# Patient Record
Sex: Female | Born: 1990 | Race: Black or African American | Hispanic: No | Marital: Single | Smoking: Current some day smoker
Health system: Southern US, Community
[De-identification: ages and names within clinical notes are randomized; demographics above are authoritative.]

## PROBLEM LIST (undated history)

## (undated) DIAGNOSIS — C801 Malignant (primary) neoplasm, unspecified: Secondary | ICD-10-CM

## (undated) DIAGNOSIS — Z923 Personal history of irradiation: Secondary | ICD-10-CM

## (undated) DIAGNOSIS — J45909 Unspecified asthma, uncomplicated: Secondary | ICD-10-CM

## (undated) HISTORY — PX: TONSILLECTOMY: SUR1361

---
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
## 2012-03-19 ENCOUNTER — Emergency Department (HOSPITAL_COMMUNITY)
Admission: EM | Admit: 2012-03-19 | Discharge: 2012-03-20 | Disposition: A | Discharge status: Home / Self Care | Payer: Medicaid Other | Attending: Emergency Medicine | Admitting: Emergency Medicine

## 2012-03-19 ENCOUNTER — Encounter (HOSPITAL_COMMUNITY): Payer: Self-pay | Admitting: *Deleted

## 2012-03-19 DIAGNOSIS — J45909 Unspecified asthma, uncomplicated: Secondary | ICD-10-CM | POA: Insufficient documentation

## 2012-03-19 DIAGNOSIS — F17200 Nicotine dependence, unspecified, uncomplicated: Secondary | ICD-10-CM | POA: Insufficient documentation

## 2012-03-19 DIAGNOSIS — J40 Bronchitis, not specified as acute or chronic: Secondary | ICD-10-CM | POA: Insufficient documentation

## 2012-03-19 HISTORY — DX: Unspecified asthma, uncomplicated: J45.909

## 2012-03-19 NOTE — ED Notes (Signed)
Pt c/o cough; rib pain; head cold; out of [facility] med

## 2012-03-20 MED ORDER — HYDROCOD POLST-CHLORPHEN POLST 10-8 MG/5ML PO LQCR: ORAL | Status: DC

## 2012-03-20 MED ORDER — DOXYCYCLINE HYCLATE 100 MG PO CAPS: 100.0000 mg | ORAL_CAPSULE | Freq: Two times a day (BID) | ORAL | Status: AC

## 2012-03-20 NOTE — ED Provider Notes (Signed)
History     CSN: [id number]  Arrival date & time 03/19/12  2310   First MD Initiated Contact with Patient 03/20/12 0012      Chief Complaint  Patient presents with  . URI    (Consider location/radiation/quality/duration/timing/severity/associated sxs/prior treatment) Patient is a 21 y.o. female presenting with URI. The history is provided by the patient (pt complains of cough). No language interpreter was used.  URI The primary symptoms include cough. Primary symptoms do not include fever, fatigue, headaches, ear pain, abdominal pain or rash. The current episode started 2 days ago. This is a new problem. The problem has not changed since onset. Associated with: nothing. The illness is not associated with chills, sinus pressure or congestion. The following treatments were addressed: Acetaminophen was not tried. A decongestant was not tried. Aspirin was not tried. NSAIDs were not tried. Risk factors: asthma.    Past Medical History  Diagnosis Date  . Asthma     Past Surgical History  Procedure Date  . Tonsillectomy     No family history on file.  History  Substance Use Topics  . Smoking status: Current Everyday Smoker -- 1.0 packs/day  . Smokeless tobacco: Not on file  . Alcohol Use: No    OB History    Grav Para Term Preterm Abortions TAB SAB Ect Mult Living                  Review of Systems  Constitutional: Negative for fever, chills and fatigue.  HENT: Negative for ear pain, congestion, sinus pressure and ear discharge.   Eyes: Negative for discharge.  Respiratory: Positive for cough.   Cardiovascular: Negative for chest pain.  Gastrointestinal: Negative for abdominal pain and diarrhea.  Genitourinary: Negative for frequency and hematuria.  Musculoskeletal: Negative for back pain.  Skin: Negative for rash.  Neurological: Negative for seizures and headaches.  Hematological: Negative.   Psychiatric/Behavioral: Negative for hallucinations.    Allergies    Penicillins  Home Medications   Current Outpatient Rx  Name Route Sig Dispense Refill  . ALBUTEROL SULFATE HFA 108 (90 BASE) MCG/ACT IN AERS Inhalation Inhale 2 puffs into the lungs every 6 (six) hours as needed.    Marland Kitchen HYDROCOD POLST-CPM POLST ER 10-8 MG/5ML PO LQCR  One teaspoon every 12 hours for cough 115 mL 0  . DOXYCYCLINE HYCLATE 100 MG PO CAPS Oral Take 1 capsule (100 mg total) by mouth 2 (two) times daily. 14 capsule 0    BP 133/76  Pulse 85  Temp 98.8 F (37.1 C) (Oral)  Resp 18  SpO2 100%  LMP 02/27/2012  Physical Exam  Constitutional: She is oriented to person, place, and time. She appears well-developed.  HENT:  Head: Normocephalic and atraumatic.  Eyes: Conjunctivae and EOM are normal. No scleral icterus.  Neck: Neck supple. No thyromegaly present.  Cardiovascular: Normal rate and regular rhythm.  Exam reveals no gallop and no friction rub.   No murmur heard. Pulmonary/Chest: No stridor. She has no wheezes. She has no rales. She exhibits no tenderness.  Abdominal: She exhibits no distension. There is no tenderness. There is no rebound.  Musculoskeletal: Normal range of motion. She exhibits no edema.  Lymphadenopathy:    She has no cervical adenopathy.  Neurological: She is oriented to person, place, and time. Coordination normal.  Skin: No rash noted. No erythema.  Psychiatric: She has a normal mood and affect. Her behavior is normal.    ED Course  Procedures (including critical care time)  Labs Reviewed - No data to display No results found.   1. Bronchitis       MDM          Benny Lennert, MD 03/20/12 [phone]

## 2012-08-06 ENCOUNTER — Emergency Department (HOSPITAL_COMMUNITY)
Admission: EM | Admit: 2012-08-06 | Discharge: 2012-08-07 | Disposition: A | Discharge status: Home / Self Care | Payer: Medicaid Other | Attending: Emergency Medicine | Admitting: Emergency Medicine

## 2012-08-06 ENCOUNTER — Encounter (HOSPITAL_COMMUNITY): Payer: Self-pay | Admitting: Emergency Medicine

## 2012-08-06 DIAGNOSIS — IMO0001 Reserved for inherently not codable concepts without codable children: Secondary | ICD-10-CM | POA: Insufficient documentation

## 2012-08-06 DIAGNOSIS — R6889 Other general symptoms and signs: Secondary | ICD-10-CM

## 2012-08-06 DIAGNOSIS — F17200 Nicotine dependence, unspecified, uncomplicated: Secondary | ICD-10-CM | POA: Insufficient documentation

## 2012-08-06 DIAGNOSIS — J45909 Unspecified asthma, uncomplicated: Secondary | ICD-10-CM | POA: Insufficient documentation

## 2012-08-06 DIAGNOSIS — R51 Headache: Secondary | ICD-10-CM | POA: Insufficient documentation

## 2012-08-06 DIAGNOSIS — Z79899 Other long term (current) drug therapy: Secondary | ICD-10-CM | POA: Insufficient documentation

## 2012-08-06 MED ORDER — IBUPROFEN 800 MG PO TABS
800.0000 mg | ORAL_TABLET | Freq: Once | ORAL | Status: AC
  Administered 2012-08-06: 800 mg via ORAL
  Filled 2012-08-06: qty 1

## 2012-08-06 NOTE — ED Notes (Signed)
Patient states she has had fever and generalized body aches since Monday 08/03/12. Saw MD at [facility] today, was told a Rx was being sent. Patient greatest complaint at this time is a headache, that has lasted @2  hours. Patient was here with mother and requested treatment. Patient afebrile at this time.

## 2012-08-06 NOTE — ED Notes (Addendum)
Patient states on Monday she began having "flu-like" symptoms. Patient reports fever 103.6 yesterday (Wednesday). Generalized body aches. Patient states she did not receive the flu vaccine this year. Patient reports cough and headache.

## 2012-08-08 NOTE — ED Provider Notes (Signed)
History     CSN: [id number]  Arrival date & time 08/06/12  2301   First MD Initiated Contact with Patient 08/06/12 2316      Chief Complaint  Patient presents with  . Fever  . Generalized Body Aches    (Consider location/radiation/quality/duration/timing/severity/associated sxs/prior treatment) HPI Comments: Faith Pope 21 y.o. female   The chief complaint is: Patient presents with: Headache    Patient here with her mother who is in acute care. She asked one of the staff for advil for a headache but mentioned that she had been having flu-like sxs this week and was asked to check in. Patient states that she has had fever up to 103,malaise, chills, h/a, rinohrrhea and cough for several days. She is afebrile now and states that she is feeling much better than she has previously. Headache is global and worse with cough. Gradual in onset. 6/10. Denies photophobia, phonophobia, UL throbbing, N/V, visual changes, stiff neck, neck pain, rash, or "thunderclap" onset. Denies DOE, SOB, chest tightness or pressure, radiation to left arm, jaw or back, or diaphoresis. Denies dysuria, flank pain, suprapubic pain, frequency, urgency, or hematuria.  light headedness, weakness, . Denies abdominal pain,diarrhea or constipation.    The history is provided by the patient. No language interpreter was used.    Past Medical History  Diagnosis Date  . Asthma     Past Surgical History  Procedure Date  . Tonsillectomy     History reviewed. No pertinent family history.  History  Substance Use Topics  . Smoking status: Current Every Day Smoker -- 1.0 packs/day    Types: Cigarettes  . Smokeless tobacco: Not on file  . Alcohol Use: Yes     Comment: socially    OB History    Grav Para Term Preterm Abortions TAB SAB Ect Mult Living                  Review of Systems Ten systems reviewed and are negative for acute change, except as noted in the HPI.   Allergies  Penicillins  Home  Medications   Current Outpatient Rx  Name  Route  Sig  Dispense  Refill  . ALBUTEROL SULFATE HFA 108 (90 BASE) MCG/ACT IN AERS   Inhalation   Inhale 2 puffs into the lungs every 6 (six) hours as needed.         Marland Kitchen HYDROCOD POLST-CPM POLST ER 10-8 MG/5ML PO LQCR      One teaspoon every 12 hours for cough   115 mL   0     BP 110/73  Pulse 80  Temp 98.7 F (37.1 C) (Oral)  Resp 14  Ht 5\' 2"  (1.575 m)  Wt 143 lb (64.864 kg)  BMI 26.15 kg/m2  SpO2 100%  LMP 07/07/2012  Physical Exam  Nursing note and vitals reviewed. Constitutional: She is oriented to person, place, and time. She appears well-developed and well-nourished. No distress.  HENT:  Head: Normocephalic and atraumatic.  Right Ear: External ear normal.  Left Ear: External ear normal.  Mouth/Throat: No oropharyngeal exudate.  Eyes: Conjunctivae normal are normal. Pupils are equal, round, and reactive to light. No scleral icterus.  Neck: Normal range of motion. Neck supple. No JVD present. No thyromegaly present.  Cardiovascular: Normal rate, regular rhythm, normal heart sounds and intact distal pulses.  Exam reveals no gallop and no friction rub.   No murmur heard. Pulmonary/Chest: Effort normal and breath sounds normal. No respiratory distress.  Abdominal: Soft. Bowel sounds  are normal. She exhibits no distension and no mass. There is no tenderness. There is no guarding.  Musculoskeletal: Normal range of motion. She exhibits no edema and no tenderness.       No meningismus  Neurological: She is alert and oriented to person, place, and time. She has normal reflexes. No cranial nerve deficit. Coordination normal.  Skin: Skin is warm and dry. No rash noted. She is not diaphoretic.    ED Course  Procedures (including critical care time)  Labs Reviewed - No data to display No results found.   1. Headache   2. Flu-like symptoms       MDM   Filed Vitals:   08/06/12 2336  BP: 110/73  Pulse: 80  Temp: 98.7  F (37.1 C)  TempSrc: Oral  Resp: 14  Height: 5\' 2"  (1.575 m)  Weight: 143 lb (64.864 kg)  SpO2: 100%   Patient c/o headache with sxs of SAH or meningitis. Pt is afebrile with no focal neuro deficits, nuchal rigidity, or change in vision.  Patient sxs have improved with medication. Suggest hydration, rest and otc analgesia for HA/Flu sxs. Return precautions discussed,Pt verbalizes understanding and is agreeable with plan to dc.           Arthor Captain, PA-C 08/09/12 0000

## 2012-08-09 NOTE — ED Provider Notes (Signed)
Medical screening examination/treatment/procedure(s) were performed by non-physician practitioner and as supervising physician I was immediately available for consultation/collaboration.   Celene Kras, MD 08/09/12 [phone]

## 2013-08-12 ENCOUNTER — Emergency Department (HOSPITAL_COMMUNITY)
Admission: EM | Admit: 2013-08-12 | Discharge: 2013-08-12 | Disposition: A | Discharge status: Home / Self Care | Payer: Medicaid Other | Attending: Emergency Medicine | Admitting: Emergency Medicine

## 2013-08-12 ENCOUNTER — Encounter (HOSPITAL_COMMUNITY): Payer: Self-pay | Admitting: Emergency Medicine

## 2013-08-12 DIAGNOSIS — J069 Acute upper respiratory infection, unspecified: Secondary | ICD-10-CM | POA: Insufficient documentation

## 2013-08-12 DIAGNOSIS — Z79899 Other long term (current) drug therapy: Secondary | ICD-10-CM | POA: Insufficient documentation

## 2013-08-12 DIAGNOSIS — Z880 Allergy status to penicillin: Secondary | ICD-10-CM | POA: Insufficient documentation

## 2013-08-12 DIAGNOSIS — J45909 Unspecified asthma, uncomplicated: Secondary | ICD-10-CM | POA: Insufficient documentation

## 2013-08-12 DIAGNOSIS — F17200 Nicotine dependence, unspecified, uncomplicated: Secondary | ICD-10-CM | POA: Insufficient documentation

## 2013-08-12 LAB — RAPID STREP SCREEN (MED CTR MEBANE ONLY): Streptococcus, Group A Screen (Direct): NEGATIVE

## 2013-08-12 NOTE — ED Provider Notes (Signed)
CSN: [id number]     Arrival date & time 08/12/13  1642 History  This chart was scribed for non-physician practitioner working with Lyanne Co, MD by Ashley Jacobs, ED scribe. This patient was seen in room WTR7/WTR7 and the patient's care was started at 5:38 PM.   First MD Initiated Contact with Patient 08/12/13 1723     Chief Complaint  Patient presents with  . Sore Throat   (Consider location/radiation/quality/duration/timing/severity/associated sxs/prior Treatment) The history is provided by the patient and medical records. No language interpreter was used.   HPI Comments: Faith Pope is a 22 y.o. female who presents to the Emergency Department complaining of persistent sore throat for the past week. She as the associated symptoms of "stuffiness", runny nose, and headache/ sinus pressure to her frontal region. Pt denies appetites changes, chest pain, abdominal pain, and cough. She had a prior tonsillectomy. Pt had sick contact. She is unable to go back to work until she visits a doctor ( she is unable to go back to work until she is cleared by a doctor).  Past Medical History  Diagnosis Date  . Asthma    Past Surgical History  Procedure Laterality Date  . Tonsillectomy     History reviewed. No pertinent family history. History  Substance Use Topics  . Smoking status: Current Every Day Smoker -- 1.00 packs/day    Types: Cigarettes  . Smokeless tobacco: Not on file  . Alcohol Use: Yes     Comment: socially   OB History   Grav Para Term Preterm Abortions TAB SAB Ect Mult Living                 Review of Systems  Constitutional: Negative for appetite change.  HENT: Positive for congestion, rhinorrhea, sinus pressure and sore throat.   Respiratory: Negative for cough.   Cardiovascular: Negative for chest pain.  Gastrointestinal: Negative for abdominal pain.    Allergies  Penicillins  Home Medications   Current Outpatient Rx  Name  Route  Sig  Dispense  Refill   . albuterol (PROVENTIL HFA;VENTOLIN HFA) 108 (90 BASE) MCG/ACT inhaler   Inhalation   Inhale 2 puffs into the lungs every 6 (six) hours as needed.         . chlorpheniramine-HYDROcodone (TUSSIONEX PENNKINETIC ER) 10-8 MG/5ML LQCR      One teaspoon every 12 hours for cough   115 mL   0    BP 117/67  Pulse 78  Temp(Src) 98.8 F (37.1 C) (Oral)  Resp 20  SpO2 99% Physical Exam  Nursing note and vitals reviewed. Constitutional: She appears well-developed and well-nourished. No distress.  HENT:  Head: Normocephalic and atraumatic.  Mouth/Throat: Oropharynx is clear and moist. No oropharyngeal exudate.  Eyes: Conjunctivae are normal.  Neck: Neck supple.  Cardiovascular: Normal rate, regular rhythm and normal heart sounds.   Pulmonary/Chest: Effort normal and breath sounds normal. No respiratory distress. She has no wheezes. She has no rales. She exhibits no tenderness.  Lymphadenopathy:    She has no cervical adenopathy.  Neurological: She is alert.  Skin: She is not diaphoretic.    ED Course  Procedures (including critical care time) DIAGNOSTIC STUDIES: Oxygen Saturation is 99% on room air, normal by my interpretation.    COORDINATION OF CARE:  5:42 PM Discussed course of care with pt which includes rapid strep test.  Pt understands and agrees.   Labs Review Labs Reviewed  RAPID STREP SCREEN  CULTURE, GROUP A STREP  Imaging Review No results found.  EKG Interpretation   None       MDM   1. URI (upper respiratory infection)    Patient with sore throat and nasal congestion x 1 week.  Was sent to ED by her place of employment.  Pt states she feels she is getting better on her own.  Afebrile, nontoxic.  Exam unremarkable.  D/C home.  Discussed findings, treatment, and follow up  with patient.  Pt given return precautions.  Pt verbalizes understanding and agrees with plan.       I personally performed the services described in this documentation, which was  scribed in my presence. The recorded information has been reviewed and is accurate.     Trixie Dredge, PA-C 08/12/13 [phone]

## 2013-08-12 NOTE — ED Notes (Addendum)
Pt reports sore throat for past few days. Also has non productive cough. Pt states she needs work note to go back to work

## 2013-08-12 NOTE — ED Provider Notes (Signed)
Medical screening examination/treatment/procedure(s) were performed by non-physician practitioner and as supervising physician I was immediately available for consultation/collaboration.  EKG Interpretation   None         Lyanne Co, MD 08/12/13 2236

## 2013-08-14 LAB — CULTURE, GROUP A STREP

## 2013-12-05 ENCOUNTER — Encounter (HOSPITAL_COMMUNITY): Payer: Self-pay | Admitting: Emergency Medicine

## 2013-12-05 ENCOUNTER — Emergency Department (HOSPITAL_COMMUNITY)
Admission: EM | Admit: 2013-12-05 | Discharge: 2013-12-05 | Disposition: A | Discharge status: Home / Self Care | Payer: Medicaid Other | Attending: Emergency Medicine | Admitting: Emergency Medicine

## 2013-12-05 ENCOUNTER — Emergency Department (HOSPITAL_COMMUNITY): Payer: Medicaid Other

## 2013-12-05 DIAGNOSIS — Y92410 Unspecified street and highway as the place of occurrence of the external cause: Secondary | ICD-10-CM | POA: Insufficient documentation

## 2013-12-05 DIAGNOSIS — J45909 Unspecified asthma, uncomplicated: Secondary | ICD-10-CM | POA: Insufficient documentation

## 2013-12-05 DIAGNOSIS — S40011A Contusion of right shoulder, initial encounter: Secondary | ICD-10-CM

## 2013-12-05 DIAGNOSIS — Y9389 Activity, other specified: Secondary | ICD-10-CM | POA: Insufficient documentation

## 2013-12-05 DIAGNOSIS — S8002XA Contusion of left knee, initial encounter: Secondary | ICD-10-CM

## 2013-12-05 DIAGNOSIS — S40019A Contusion of unspecified shoulder, initial encounter: Secondary | ICD-10-CM | POA: Insufficient documentation

## 2013-12-05 DIAGNOSIS — F17200 Nicotine dependence, unspecified, uncomplicated: Secondary | ICD-10-CM | POA: Insufficient documentation

## 2013-12-05 DIAGNOSIS — Z880 Allergy status to penicillin: Secondary | ICD-10-CM | POA: Insufficient documentation

## 2013-12-05 DIAGNOSIS — S8000XA Contusion of unspecified knee, initial encounter: Secondary | ICD-10-CM | POA: Insufficient documentation

## 2013-12-05 MED ORDER — IBUPROFEN 800 MG PO TABS
800.0000 mg | ORAL_TABLET | Freq: Once | ORAL | Status: AC
  Administered 2013-12-05: 800 mg via ORAL
  Filled 2013-12-05: qty 1

## 2013-12-05 MED ORDER — IBUPROFEN 800 MG PO TABS: 800.0000 mg | ORAL_TABLET | Freq: Three times a day (TID) | ORAL | Status: DC
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
## 2013-12-05 NOTE — Discharge Instructions (Signed)
Your x-rays do not show any broken bones or dislocations in your shoulder or knee from your injuries and fall. Continue to rest your sore areas and followup with a primary care provider for continued evaluation and treatment.    Contusion A contusion is a deep bruise. Contusions are the result of an injury that caused bleeding under the skin. The contusion may turn blue, purple, or yellow. Minor injuries will give you a painless contusion, but more severe contusions may stay painful and swollen for a few weeks.  CAUSES  A contusion is usually caused by a blow, trauma, or direct force to an area of the body. SYMPTOMS   Swelling and redness of the injured area.  Bruising of the injured area.  Tenderness and soreness of the injured area.  Pain. DIAGNOSIS  The diagnosis can be made by taking a history and physical exam. An X-ray, CT scan, or MRI may be needed to determine if there were any associated injuries, such as fractures. TREATMENT  Specific treatment will depend on what area of the body was injured. In general, the best treatment for a contusion is resting, icing, elevating, and applying cold compresses to the injured area. Over-the-counter medicines may also be recommended for pain control. Ask your caregiver what the best treatment is for your contusion. HOME CARE INSTRUCTIONS   Put ice on the injured area.  Put ice in a plastic bag.  Place a towel between your skin and the bag.  Leave the ice on for 15-20 minutes, 03-04 times a day.  Only take over-the-counter or prescription medicines for pain, discomfort, or fever as directed by your caregiver. Your caregiver may recommend avoiding anti-inflammatory medicines (aspirin, ibuprofen, and naproxen) for 48 hours because these medicines may increase bruising.  Rest the injured area.  If possible, elevate the injured area to reduce swelling. SEEK IMMEDIATE MEDICAL CARE IF:   You have increased bruising or swelling.  You have  pain that is getting worse.  Your swelling or pain is not relieved with medicines. MAKE SURE YOU:   Understand these instructions.  Will watch your condition.  Will get help right away if you are not doing well or get worse. Document Released: 05/22/2005 Document Revised: 11/04/2011 Document Reviewed: 06/17/2011 [facility] Patient Information 2014 [place], [place].    Cryotherapy Cryotherapy means treatment with cold. Ice or gel packs can be used to reduce both pain and swelling. Ice is the most helpful within the first 24 to 48 hours after an injury or flareup from overusing a muscle or joint. Sprains, strains, spasms, burning pain, shooting pain, and aches can all be eased with ice. Ice can also be used when recovering from surgery. Ice is effective, has very few side effects, and is safe for most people to use. PRECAUTIONS  Ice is not a safe treatment option for people with:  Raynaud's phenomenon. This is a condition affecting small blood vessels in the extremities. Exposure to cold may cause your problems to return.  Cold hypersensitivity. There are many forms of cold hypersensitivity, including:  Cold urticaria. Red, itchy hives appear on the skin when the tissues begin to warm after being iced.  Cold erythema. This is a red, itchy rash caused by exposure to cold.  Cold hemoglobinuria. Red blood cells break down when the tissues begin to warm after being iced. The hemoglobin that carry oxygen are passed into the urine because they cannot combine with blood proteins fast enough.  Numbness or altered sensitivity in the area  being iced. If you have any of the following conditions, do not use ice until you have discussed cryotherapy with your caregiver:  Heart conditions, such as arrhythmia, angina, or chronic heart disease.  High blood pressure.  Healing wounds or open skin in the area being iced.  Current infections.  Rheumatoid arthritis.  Poor  circulation.  Diabetes. Ice slows the blood flow in the region it is applied. This is beneficial when trying to stop inflamed tissues from spreading irritating chemicals to surrounding tissues. However, if you expose your skin to cold temperatures for too long or without the proper protection, you can damage your skin or nerves. Watch for signs of skin damage due to cold. HOME CARE INSTRUCTIONS Follow these tips to use ice and cold packs safely.  Place a dry or damp towel between the ice and skin. A damp towel will cool the skin more quickly, so you may need to shorten the time that the ice is used.  For a more rapid response, add gentle compression to the ice.  Ice for no more than 10 to 20 minutes at a time. The bonier the area you are icing, the less time it will take to get the benefits of ice.  Check your skin after 5 minutes to make sure there are no signs of a poor response to cold or skin damage.  Rest 20 minutes or more in between uses.  Once your skin is numb, you can end your treatment. You can test numbness by very lightly touching your skin. The touch should be so light that you do not see the skin dimple from the pressure of your fingertip. When using ice, most people will feel these normal sensations in this order: cold, burning, aching, and numbness.  Do not use ice on someone who cannot communicate their responses to pain, such as small children or people with dementia. HOW TO MAKE AN ICE PACK Ice packs are the most common way to use ice therapy. Other methods include ice massage, ice baths, and cryo-sprays. Muscle creams that cause a cold, tingly feeling do not offer the same benefits that ice offers and should not be used as a substitute unless recommended by your caregiver. To make an ice pack, do one of the following:  Place crushed ice or a bag of frozen vegetables in a sealable plastic bag. Squeeze out the excess air. Place this bag inside another plastic bag. Slide the  bag into a pillowcase or place a damp towel between your skin and the bag.  Mix 3 parts water with 1 part rubbing alcohol. Freeze the mixture in a sealable plastic bag. When you remove the mixture from the freezer, it will be slushy. Squeeze out the excess air. Place this bag inside another plastic bag. Slide the bag into a pillowcase or place a damp towel between your skin and the bag. SEEK MEDICAL CARE IF:  You develop white spots on your skin. This may give the skin a blotchy (mottled) appearance.  Your skin turns blue or pale.  Your skin becomes waxy or hard.  Your swelling gets worse. MAKE SURE YOU:   Understand these instructions.  Will watch your condition.  Will get help right away if you are not doing well or get worse. Document Released: 04/08/2011 Document Revised: 11/04/2011 Document Reviewed: 04/08/2011 [facility] Patient Information 2014 [place], [place].

## 2013-12-05 NOTE — ED Provider Notes (Signed)
Medical screening examination/treatment/procedure(s) were performed by non-physician practitioner and as supervising physician I was immediately available for consultation/collaboration.   EKG Interpretation None       Kalman Drape, MD 12/05/13 [phone]

## 2014-11-21 ENCOUNTER — Emergency Department (HOSPITAL_COMMUNITY)
Admission: EM | Admit: 2014-11-21 | Discharge: 2014-11-21 | Disposition: A | Discharge status: Home / Self Care | Payer: Medicaid Other | Attending: Emergency Medicine | Admitting: Emergency Medicine

## 2014-11-21 ENCOUNTER — Encounter (HOSPITAL_COMMUNITY): Payer: Self-pay | Admitting: Emergency Medicine

## 2014-11-21 ENCOUNTER — Emergency Department (HOSPITAL_COMMUNITY): Payer: Medicaid Other

## 2014-11-21 DIAGNOSIS — W1839XA Other fall on same level, initial encounter: Secondary | ICD-10-CM | POA: Insufficient documentation

## 2014-11-21 DIAGNOSIS — Y9389 Activity, other specified: Secondary | ICD-10-CM | POA: Insufficient documentation

## 2014-11-21 DIAGNOSIS — Z791 Long term (current) use of non-steroidal anti-inflammatories (NSAID): Secondary | ICD-10-CM | POA: Insufficient documentation

## 2014-11-21 DIAGNOSIS — Y9289 Other specified places as the place of occurrence of the external cause: Secondary | ICD-10-CM | POA: Insufficient documentation

## 2014-11-21 DIAGNOSIS — M25561 Pain in right knee: Secondary | ICD-10-CM

## 2014-11-21 DIAGNOSIS — S8991XA Unspecified injury of right lower leg, initial encounter: Secondary | ICD-10-CM | POA: Insufficient documentation

## 2014-11-21 DIAGNOSIS — Z880 Allergy status to penicillin: Secondary | ICD-10-CM | POA: Insufficient documentation

## 2014-11-21 DIAGNOSIS — Z720 Tobacco use: Secondary | ICD-10-CM | POA: Insufficient documentation

## 2014-11-21 DIAGNOSIS — Y998 Other external cause status: Secondary | ICD-10-CM | POA: Insufficient documentation

## 2014-11-21 DIAGNOSIS — J45909 Unspecified asthma, uncomplicated: Secondary | ICD-10-CM | POA: Insufficient documentation

## 2014-11-21 MED ORDER — IBUPROFEN 800 MG PO TABS: 800.0000 mg | ORAL_TABLET | Freq: Three times a day (TID) | ORAL | Status: DC

## 2014-11-21 MED ORDER — IBUPROFEN 800 MG PO TABS
800.0000 mg | ORAL_TABLET | Freq: Once | ORAL | Status: AC
  Administered 2014-11-21: 800 mg via ORAL
  Filled 2014-11-21: qty 1

## 2014-11-21 MED ORDER — HYDROCODONE-ACETAMINOPHEN 5-325 MG PO TABS
1.0000 | ORAL_TABLET | Freq: Once | ORAL | Status: AC
  Administered 2014-11-21: 1 via ORAL
  Filled 2014-11-21: qty 1

## 2014-11-21 MED ORDER — HYDROCODONE-ACETAMINOPHEN 5-325 MG PO TABS: 1.0000 | ORAL_TABLET | ORAL | Status: DC | PRN

## 2014-11-21 NOTE — Discharge Instructions (Signed)
Cryotherapy °Cryotherapy means treatment with cold. Ice or gel packs can be used to reduce both pain and swelling. Ice is the most helpful within the first 24 to 48 hours after an injury or flare-up from overusing a muscle or joint. Sprains, strains, spasms, burning pain, shooting pain, and aches can all be eased with ice. Ice can also be used when recovering from surgery. Ice is effective, has very few side effects, and is safe for most people to use. °PRECAUTIONS  °Ice is not a safe treatment option for people with: °· Raynaud phenomenon. This is a condition affecting small blood vessels in the extremities. Exposure to cold may cause your problems to return. °· Cold hypersensitivity. There are many forms of cold hypersensitivity, including: °· Cold urticaria. Red, itchy hives appear on the skin when the tissues begin to warm after being iced. °· Cold erythema. This is a red, itchy rash caused by exposure to cold. °· Cold hemoglobinuria. Red blood cells break down when the tissues begin to warm after being iced. The hemoglobin that carry oxygen are passed into the urine because they cannot combine with blood proteins fast enough. °· Numbness or altered sensitivity in the area being iced. °If you have any of the following conditions, do not use ice until you have discussed cryotherapy with your caregiver: °· Heart conditions, such as arrhythmia, angina, or chronic heart disease. °· High blood pressure. °· Healing wounds or open skin in the area being iced. °· Current infections. °· Rheumatoid arthritis. °· Poor circulation. °· Diabetes. °Ice slows the blood flow in the region it is applied. This is beneficial when trying to stop inflamed tissues from spreading irritating chemicals to surrounding tissues. However, if you expose your skin to cold temperatures for too long or without the proper protection, you can damage your skin or nerves. Watch for signs of skin damage due to cold. °HOME CARE INSTRUCTIONS °Follow  these tips to use ice and cold packs safely. °· Place a dry or damp towel between the ice and skin. A damp towel will cool the skin more quickly, so you may need to shorten the time that the ice is used. °· For a more rapid response, add gentle compression to the ice. °· Ice for no more than 10 to 20 minutes at a time. The bonier the area you are icing, the less time it will take to get the benefits of ice. °· Check your skin after 5 minutes to make sure there are no signs of a poor response to cold or skin damage. °· Rest 20 minutes or more between uses. °· Once your skin is numb, you can end your treatment. You can test numbness by very lightly touching your skin. The touch should be so light that you do not see the skin dimple from the pressure of your fingertip. When using ice, most people will feel these normal sensations in this order: cold, burning, aching, and numbness. °· Do not use ice on someone who cannot communicate their responses to pain, such as small children or people with dementia. °HOW TO MAKE AN ICE PACK °Ice packs are the most common way to use ice therapy. Other methods include ice massage, ice baths, and cryosprays. Muscle creams that cause a cold, tingly feeling do not offer the same benefits that ice offers and should not be used as a substitute unless recommended by your caregiver. °To make an ice pack, do one of the following: °· Place crushed ice or a   bag of frozen vegetables in a sealable plastic bag. Squeeze out the excess air. Place this bag inside another plastic bag. Slide the bag into a pillowcase or place a damp towel between your skin and the bag.  Mix 3 parts water with 1 part rubbing alcohol. Freeze the mixture in a sealable plastic bag. When you remove the mixture from the freezer, it will be slushy. Squeeze out the excess air. Place this bag inside another plastic bag. Slide the bag into a pillowcase or place a damp towel between your skin and the bag. SEEK MEDICAL CARE  IF:  You develop white spots on your skin. This may give the skin a blotchy (mottled) appearance.  Your skin turns blue or pale.  Your skin becomes waxy or hard.  Your swelling gets worse. MAKE SURE YOU:   Understand these instructions.  Will watch your condition.  Will get help right away if you are not doing well or get worse. Document Released: 04/08/2011 Document Revised: 12/27/2013 Document Reviewed: 04/08/2011 [facility] Patient Information 2015 [place], [place]. This information is not intended to replace advice given to you by your health care provider. Make sure you discuss any questions you have with your health care provider. Knee Sprain A knee sprain is a tear in one of the strong, fibrous tissues that connect the bones (ligaments) in your knee. The severity of the sprain depends on how much of the ligament is torn. The tear can be either partial or complete. CAUSES  Often, sprains are a result of a fall or injury. The force of the impact causes the fibers of your ligament to stretch too much. This excess tension causes the fibers of your ligament to tear. SIGNS AND SYMPTOMS  You may have some loss of motion in your knee. Other symptoms include:  Bruising.  Pain in the knee area.  Tenderness of the knee to the touch.  Swelling. DIAGNOSIS  To diagnose a knee sprain, your health care provider will physically examine your knee. Your health care provider may also suggest an X-ray exam of your knee to make sure no bones are broken. TREATMENT  If your ligament is only partially torn, treatment usually involves keeping the knee in a fixed position (immobilization) or bracing your knee for activities that require movement for several weeks. To do this, your health care provider will apply a bandage, cast, or splint to keep your knee from moving and to support your knee during movement until it heals. For a partially torn ligament, the healing process usually takes 4-6 weeks. If  your ligament is completely torn, depending on which ligament it is, you may need surgery to reconnect the ligament to the bone or reconstruct it. After surgery, a cast or splint may be applied and will need to stay on your knee for 4-6 weeks while your ligament heals. HOME CARE INSTRUCTIONS  Keep your injured knee elevated to decrease swelling.  To ease pain and swelling, apply ice to the injured area:  Put ice in a plastic bag.  Place a towel between your skin and the bag.  Leave the ice on for 20 minutes, 2-3 times a day.  Only take medicine for pain as directed by your health care provider.  Do not leave your knee unprotected until pain and stiffness go away (usually 4-6 weeks).  If you have a cast or splint, do not allow it to get wet. If you have been instructed not to remove it, cover it with a plastic  bag when you shower or bathe. Do not swim.  Your health care provider may suggest exercises for you to do during your recovery to prevent or limit permanent weakness and stiffness. SEEK IMMEDIATE MEDICAL CARE IF:  Your cast or splint becomes damaged.  Your pain becomes worse.  You have significant pain, swelling, or numbness below the cast or splint. MAKE SURE YOU:  Understand these instructions.  Will watch your condition.  Will get help right away if you are not doing well or get worse. Document Released: 08/12/2005 Document Revised: 06/02/2013 Document Reviewed: 03/24/2013 [facility] Patient Information 2015 [place], [place]. This information is not intended to replace advice given to you by your health care provider. Make sure you discuss any questions you have with your health care provider.
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
## 2014-12-02 ENCOUNTER — Encounter (HOSPITAL_COMMUNITY): Payer: Self-pay | Admitting: Emergency Medicine

## 2014-12-02 ENCOUNTER — Emergency Department (HOSPITAL_COMMUNITY)
Admission: EM | Admit: 2014-12-02 | Discharge: 2014-12-02 | Disposition: A | Discharge status: Home / Self Care | Payer: Medicaid Other | Attending: Emergency Medicine | Admitting: Emergency Medicine

## 2014-12-02 ENCOUNTER — Emergency Department (HOSPITAL_COMMUNITY): Payer: Medicaid Other

## 2014-12-02 DIAGNOSIS — J45909 Unspecified asthma, uncomplicated: Secondary | ICD-10-CM | POA: Insufficient documentation

## 2014-12-02 DIAGNOSIS — Z720 Tobacco use: Secondary | ICD-10-CM | POA: Insufficient documentation

## 2014-12-02 DIAGNOSIS — Z79899 Other long term (current) drug therapy: Secondary | ICD-10-CM | POA: Insufficient documentation

## 2014-12-02 DIAGNOSIS — M25562 Pain in left knee: Secondary | ICD-10-CM

## 2014-12-02 DIAGNOSIS — R52 Pain, unspecified: Secondary | ICD-10-CM

## 2014-12-02 DIAGNOSIS — Z880 Allergy status to penicillin: Secondary | ICD-10-CM | POA: Insufficient documentation

## 2014-12-02 MED ORDER — HYDROCODONE-ACETAMINOPHEN 5-325 MG PO TABS
1.0000 | ORAL_TABLET | Freq: Once | ORAL | Status: AC
Start: 2014-12-02 — End: 2014-12-02
  Administered 2014-12-02: 1 via ORAL
  Filled 2014-12-02: qty 1

## 2014-12-02 MED ORDER — TRAMADOL HCL 50 MG PO TABS: 50.0000 mg | ORAL_TABLET | Freq: Four times a day (QID) | ORAL | Status: DC | PRN
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
## 2014-12-02 NOTE — ED Notes (Signed)
Patient transported to X-ray 

## 2014-12-02 NOTE — ED Notes (Signed)
Patient stated to MD she has crutches at home. Patient stated she knows how to use the crutches.

## 2014-12-02 NOTE — Discharge Instructions (Signed)
For pain control you may take:  800mg of ibuprofen (that is usually 4 over the counter pills)  3 times a day (take with food) and acetaminophen 975mg (this is 3 over the counter pills) four times a day. Do not drink alcohol or combine with other medications that have acetaminophen as an ingredient (Read the labels!).  For breakthrough pain you may take Tramadol. Do not drink alcohol drive or operate heavy machinery when taking Tramadol. ° °Please follow with your primary care doctor in the next 2 days for a check-up. They must obtain records for further management.  ° °Do not hesitate to return to the Emergency Department for any new, worsening or concerning symptoms.  ° °
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
## 2015-08-13 ENCOUNTER — Emergency Department (HOSPITAL_COMMUNITY): Payer: No Typology Code available for payment source

## 2015-08-13 ENCOUNTER — Emergency Department (HOSPITAL_COMMUNITY)
Admission: EM | Admit: 2015-08-13 | Discharge: 2015-08-13 | Disposition: A | Discharge status: Home / Self Care | Payer: No Typology Code available for payment source | Attending: Emergency Medicine | Admitting: Emergency Medicine

## 2015-08-13 ENCOUNTER — Encounter (HOSPITAL_COMMUNITY): Payer: Self-pay | Admitting: Emergency Medicine

## 2015-08-13 DIAGNOSIS — S39012A Strain of muscle, fascia and tendon of lower back, initial encounter: Secondary | ICD-10-CM | POA: Insufficient documentation

## 2015-08-13 DIAGNOSIS — F17210 Nicotine dependence, cigarettes, uncomplicated: Secondary | ICD-10-CM | POA: Diagnosis not present

## 2015-08-13 DIAGNOSIS — S4992XA Unspecified injury of left shoulder and upper arm, initial encounter: Secondary | ICD-10-CM | POA: Diagnosis not present

## 2015-08-13 DIAGNOSIS — Y9389 Activity, other specified: Secondary | ICD-10-CM | POA: Diagnosis not present

## 2015-08-13 DIAGNOSIS — J45909 Unspecified asthma, uncomplicated: Secondary | ICD-10-CM | POA: Diagnosis not present

## 2015-08-13 DIAGNOSIS — Z880 Allergy status to penicillin: Secondary | ICD-10-CM | POA: Diagnosis not present

## 2015-08-13 DIAGNOSIS — Z3202 Encounter for pregnancy test, result negative: Secondary | ICD-10-CM | POA: Diagnosis not present

## 2015-08-13 DIAGNOSIS — Y92410 Unspecified street and highway as the place of occurrence of the external cause: Secondary | ICD-10-CM | POA: Diagnosis not present

## 2015-08-13 DIAGNOSIS — M25522 Pain in left elbow: Secondary | ICD-10-CM

## 2015-08-13 DIAGNOSIS — Y998 Other external cause status: Secondary | ICD-10-CM | POA: Diagnosis not present

## 2015-08-13 DIAGNOSIS — S3992XA Unspecified injury of lower back, initial encounter: Secondary | ICD-10-CM | POA: Diagnosis present

## 2015-08-13 LAB — POC URINE PREG, ED: PREG TEST UR: NEGATIVE

## 2015-08-13 MED ORDER — IBUPROFEN 800 MG PO TABS: 800.0000 mg | ORAL_TABLET | Freq: Three times a day (TID) | ORAL | Status: DC

## 2015-08-13 MED ORDER — IBUPROFEN 800 MG PO TABS
800.0000 mg | ORAL_TABLET | Freq: Once | ORAL | Status: AC
  Administered 2015-08-13: 800 mg via ORAL
  Filled 2015-08-13: qty 1

## 2015-08-13 MED ORDER — ONDANSETRON 4 MG PO TBDP
4.0000 mg | ORAL_TABLET | Freq: Once | ORAL | Status: DC
  Filled 2015-08-13: qty 1

## 2015-08-13 MED ORDER — CYCLOBENZAPRINE HCL 10 MG PO TABS: 10.0000 mg | ORAL_TABLET | Freq: Two times a day (BID) | ORAL | Status: DC | PRN

## 2015-08-13 MED ORDER — HYDROCODONE-ACETAMINOPHEN 5-325 MG PO TABS
1.0000 | ORAL_TABLET | Freq: Once | ORAL | Status: AC
  Administered 2015-08-13: 1 via ORAL
  Filled 2015-08-13: qty 1

## 2015-08-13 MED ORDER — HYDROCODONE-ACETAMINOPHEN 5-325 MG PO TABS: 1.0000 | ORAL_TABLET | ORAL | Status: DC | PRN
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
## 2015-08-13 NOTE — ED Notes (Signed)
Pt ambulated to the bathroom with Probation officer.
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
## 2015-10-15 ENCOUNTER — Encounter (HOSPITAL_COMMUNITY): Payer: Self-pay | Admitting: Family Medicine

## 2015-10-15 ENCOUNTER — Emergency Department (HOSPITAL_COMMUNITY)
Admission: EM | Admit: 2015-10-15 | Discharge: 2015-10-15 | Disposition: A | Discharge status: Home / Self Care | Payer: No Typology Code available for payment source | Attending: Emergency Medicine | Admitting: Emergency Medicine

## 2015-10-15 DIAGNOSIS — J019 Acute sinusitis, unspecified: Secondary | ICD-10-CM | POA: Insufficient documentation

## 2015-10-15 DIAGNOSIS — Z880 Allergy status to penicillin: Secondary | ICD-10-CM | POA: Insufficient documentation

## 2015-10-15 DIAGNOSIS — J45901 Unspecified asthma with (acute) exacerbation: Secondary | ICD-10-CM | POA: Insufficient documentation

## 2015-10-15 DIAGNOSIS — F17210 Nicotine dependence, cigarettes, uncomplicated: Secondary | ICD-10-CM | POA: Insufficient documentation

## 2015-10-15 DIAGNOSIS — Z791 Long term (current) use of non-steroidal anti-inflammatories (NSAID): Secondary | ICD-10-CM | POA: Insufficient documentation

## 2015-10-15 MED ORDER — OXYMETAZOLINE HCL 0.05 % NA SOLN
1.0000 | Freq: Once | NASAL | Status: AC
  Administered 2015-10-15: 1 via NASAL
  Filled 2015-10-15: qty 15

## 2015-10-15 MED ORDER — SALINE SPRAY 0.65 % NA SOLN
1.0000 | Freq: Once | NASAL | Status: AC
  Administered 2015-10-15: 1 via NASAL
  Filled 2015-10-15: qty 44

## 2015-10-15 MED ORDER — PREDNISONE 20 MG PO TABS
60.0000 mg | ORAL_TABLET | Freq: Once | ORAL | Status: AC
  Administered 2015-10-15: 60 mg via ORAL
  Filled 2015-10-15: qty 3

## 2015-10-15 MED ORDER — CETIRIZINE-PSEUDOEPHEDRINE ER 5-120 MG PO TB12
1.0000 | ORAL_TABLET | Freq: Two times a day (BID) | ORAL | Status: DC
Start: 2015-10-15 — End: 2017-06-20

## 2015-10-15 MED ORDER — PREDNISONE 20 MG PO TABS: 40.0000 mg | ORAL_TABLET | Freq: Every day | ORAL | Status: DC

## 2015-10-15 MED ORDER — DOXYCYCLINE HYCLATE 100 MG PO CAPS: 100.0000 mg | ORAL_CAPSULE | Freq: Two times a day (BID) | ORAL | Status: DC

## 2015-10-15 NOTE — Discharge Instructions (Signed)
Start doxycycline on 10/18/15 if no improvement in symptoms. Use Afrin daily, but discontinue after 10/17/15. Take prednisone as prescribed. You may use saline nasal spray as needed. Take Claritin OR Zyrtec-D for congestion. Use a cool mist vaporizer or humidifier at nighttime. You may also try frequent steam showers to manage congestion.  Sinusitis, Adult Sinusitis is redness, soreness, and inflammation of the paranasal sinuses. Paranasal sinuses are air pockets within the bones of your face. They are located beneath your eyes, in the middle of your forehead, and above your eyes. In healthy paranasal sinuses, mucus is able to drain out, and air is able to circulate through them by way of your nose. However, when your paranasal sinuses are inflamed, mucus and air can become trapped. This can allow bacteria and other germs to grow and cause infection. Sinusitis can develop quickly and last only a short time (acute) or continue over a long period (chronic). Sinusitis that lasts for more than 12 weeks is considered chronic. CAUSES Causes of sinusitis include:  Allergies.  Structural abnormalities, such as displacement of the cartilage that separates your nostrils (deviated septum), which can decrease the air flow through your nose and sinuses and affect sinus drainage.  Functional abnormalities, such as when the small hairs (cilia) that line your sinuses and help remove mucus do not work properly or are not present. SIGNS AND SYMPTOMS Symptoms of acute and chronic sinusitis are the same. The primary symptoms are pain and pressure around the affected sinuses. Other symptoms include:  Upper toothache.  Earache.  Headache.  Bad breath.  Decreased sense of smell and taste.  A cough, which worsens when you are lying flat.  Fatigue.  Fever.  Thick drainage from your nose, which often is green and may contain pus (purulent).  Swelling and warmth over the affected sinuses. DIAGNOSIS Your  health care provider will perform a physical exam. During your exam, your health care provider may perform any of the following to help determine if you have acute sinusitis or chronic sinusitis:  Look in your nose for signs of abnormal growths in your nostrils (nasal polyps).  Tap over the affected sinus to check for signs of infection.  View the inside of your sinuses using an imaging device that has a light attached (endoscope). If your health care provider suspects that you have chronic sinusitis, one or more of the following tests may be recommended:  Allergy tests.  Nasal culture. A sample of mucus is taken from your nose, sent to a lab, and screened for bacteria.  Nasal cytology. A sample of mucus is taken from your nose and examined by your health care provider to determine if your sinusitis is related to an allergy. TREATMENT Most cases of acute sinusitis are related to a viral infection and will resolve on their own within 10 days. Sometimes, medicines are prescribed to help relieve symptoms of both acute and chronic sinusitis. These may include pain medicines, decongestants, nasal steroid sprays, or saline sprays. However, for sinusitis related to a bacterial infection, your health care provider will prescribe antibiotic medicines. These are medicines that will help kill the bacteria causing the infection. Rarely, sinusitis is caused by a fungal infection. In these cases, your health care provider will prescribe antifungal medicine. For some cases of chronic sinusitis, surgery is needed. Generally, these are cases in which sinusitis recurs more than 3 times per year, despite other treatments. HOME CARE INSTRUCTIONS  Drink plenty of water. Water helps thin the mucus so your  sinuses can drain more easily.  Use a humidifier.  Inhale steam 3-4 times a day (for example, sit in the bathroom with the shower running).  Apply a warm, moist washcloth to your face 3-4 times a day, or as  directed by your health care provider.  Use saline nasal sprays to help moisten and clean your sinuses.  Take medicines only as directed by your health care provider.  If you were prescribed either an antibiotic or antifungal medicine, finish it all even if you start to feel better. SEEK IMMEDIATE MEDICAL CARE IF:  You have increasing pain or severe headaches.  You have nausea, vomiting, or drowsiness.  You have swelling around your face.  You have vision problems.  You have a stiff neck.  You have difficulty breathing.   This information is not intended to replace advice given to you by your health care provider. Make sure you discuss any questions you have with your health care provider.   Document Released: 08/12/2005 Document Revised: 09/02/2014 Document Reviewed: 08/27/2011 Elsevier Interactive Patient Education [employer].

## 2015-10-15 NOTE — ED Provider Notes (Signed)
CSN: [id number]     Arrival date & time 10/15/15  2224 History   By signing my name below, I, Faith Pope, attest that this documentation has been prepared under the direction and in the presence of [employer], PA-C.  Electronically Signed: [name], ED Scribe. 10/15/2015. 11:20 PM.   Chief Complaint  Patient presents with  . Nasal Congestion   The history is provided by the patient. No language interpreter was used.     HPI Comments: Faith Pope is a 25 y.o. female with a PMHx of asthma who presents to the Emergency Department complaining of constant, ongoing nasal congestion x 3 days. Pt also reports ongoing wheezing. No aggravating or alleviating factors at this time. OTC nasal sprays, Benadryl, Claritin, and Zyrtec attempted prior to arrival without any improvement. Last dose of Claritin taken at 9:00 PM this evening.  No recent fever, chills, or hearing loss.  PCP: PROVIDER NOT IN SYSTEM    Past Medical History  Diagnosis Date  . Asthma    Past Surgical History  Procedure Laterality Date  . Tonsillectomy     History reviewed. No pertinent family history. Social History  Substance Use Topics  . Smoking status: Current Every Day Smoker -- 1.00 packs/day    Types: Cigarettes  . Smokeless tobacco: None  . Alcohol Use: Yes     Comment: "Not really ever"   OB History    No data available     Review of Systems  Constitutional: Negative for fever and chills.  HENT: Positive for congestion. Negative for hearing loss.   Respiratory: Positive for wheezing. Negative for cough.   All other systems reviewed and are negative.     Allergies  Shellfish allergy and Penicillins  Home Medications   Prior to Admission medications   Medication Sig Start Date End Date Taking? Authorizing Provider  cyclobenzaprine (FLEXERIL) 10 MG tablet Take 1 tablet (10 mg total) by mouth 2 (two) times daily as needed for muscle spasms. 08/13/15   Charlann Lange, PA-C   HYDROcodone-acetaminophen (NORCO/VICODIN) 5-325 MG tablet Take 1-2 tablets by mouth every 4 (four) hours as needed. 08/13/15   Charlann Lange, PA-C  ibuprofen (ADVIL,MOTRIN) 800 MG tablet Take 1 tablet (800 mg total) by mouth 3 (three) times daily. 08/13/15   Charlann Lange, PA-C  traMADol (ULTRAM) 50 MG tablet Take 1 tablet (50 mg total) by mouth every 6 (six) hours as needed. Patient not taking: Reported on 08/13/2015 12/02/14   Elmyra Ricks Pisciotta, PA-C   Triage Vitals: BP 134/76 mmHg  Pulse 85  Temp(Src) 98.6 F (37 C) (Oral)  Resp 15  Ht 5\' 2"  (1.575 m)  Wt 168 lb (76.204 kg)  BMI 30.72 kg/m2  SpO2 99%  LMP 09/15/2015   Physical Exam  Constitutional: She is oriented to person, place, and time. She appears well-developed and well-nourished. No distress.  Nontoxic/nonseptic appearing  HENT:  Head: Normocephalic and atraumatic.  Right Ear: Tympanic membrane, external ear and ear canal normal.  Left Ear: External ear and ear canal normal.  Nose: Mucosal edema present. No septal deviation or nasal septal hematoma.  Mouth/Throat: Uvula is midline, oropharynx is clear and moist and mucous membranes are normal.  Uvula midline. No posterior oropharyngeal erythema or edema. Mild injection to the left tympanic membrane compared to the right. Cone of light intact. No bulging, retraction, or perforation of TMs bilaterally.  Eyes: Conjunctivae and EOM are normal. No scleral icterus.  Neck: Normal range of motion.  No nuchal rigidity or  meningismus  Cardiovascular: Normal rate, regular rhythm and intact distal pulses.   Pulmonary/Chest: Effort normal and breath sounds normal. No respiratory distress. She has no wheezes. She has no rales.  No nasal flaring, grunting, or retractions. Lungs clear.  Musculoskeletal: Normal range of motion.  Neurological: She is alert and oriented to person, place, and time. She exhibits normal muscle tone. Coordination normal.  Ambulatory with steady gait.  Skin:  Skin is warm and dry. No rash noted. She is not diaphoretic. No erythema. No pallor.  Psychiatric: She has a normal mood and affect. Her behavior is normal.  Nursing note and vitals reviewed.   ED Course  Procedures (including critical care time)  DIAGNOSTIC STUDIES: Oxygen Saturation is 99% on RA, Normal by my interpretation.    COORDINATION OF CARE: 11:16 PM- Will give Afrin. Discussed treatment plan with pt at bedside and pt agreed to plan.     Labs Review Labs Reviewed - No data to display  Imaging Review No results found.   I have personally reviewed and evaluated these images and lab results as part of my medical decision-making.   EKG Interpretation None      MDM   Final diagnoses:  Acute sinusitis, recurrence not specified, unspecified location    Patient complaining of symptoms of sinusitis x 3 days. Mild to moderate symptoms of clear/yellow nasal discharge/congestion and scratchy throat for less than 10 days. Patient is afebrile. No concern for acute bacterial rhinosinusitis; likely viral in nature. Patient discharged with symptomatic treatment. Did give Rx for doxycycline to fill on 10/18/15 if no improvement in congestion symptoms. Patient instructed on the use of nasal saline spray/rinses. Return precautions discussed and provided. Patient agreeable to plan with no unaddressed concerns. Patient discharged in good condition.  I personally performed the services described in this documentation, which was scribed in my presence. The recorded information has been reviewed and is accurate.    Filed Vitals:   10/15/15 2237  BP: 134/76  Pulse: 85  Temp: 98.6 F (37 C)  TempSrc: Oral  Resp: 15  Height: 5\' 2"  (1.575 m)  Weight: 76.204 kg  SpO2: 99%     [name], PA-C 10/15/15 2339  Virgel Manifold, MD 10/19/15 [phone]

## 2015-10-15 NOTE — ED Notes (Signed)
Patient is complaining of nasal congestion that started 3 days ago. Took OTC medication with minimal relief.

## 2016-04-30 ENCOUNTER — Emergency Department (HOSPITAL_COMMUNITY)
Admission: EM | Admit: 2016-04-30 | Discharge: 2016-04-30 | Disposition: A | Discharge status: Home / Self Care | Payer: Medicaid Other | Attending: Emergency Medicine | Admitting: Emergency Medicine

## 2016-04-30 ENCOUNTER — Encounter (HOSPITAL_COMMUNITY): Payer: Self-pay | Admitting: Emergency Medicine

## 2016-04-30 DIAGNOSIS — F17210 Nicotine dependence, cigarettes, uncomplicated: Secondary | ICD-10-CM | POA: Insufficient documentation

## 2016-04-30 DIAGNOSIS — X58XXXA Exposure to other specified factors, initial encounter: Secondary | ICD-10-CM | POA: Insufficient documentation

## 2016-04-30 DIAGNOSIS — Y999 Unspecified external cause status: Secondary | ICD-10-CM | POA: Insufficient documentation

## 2016-04-30 DIAGNOSIS — J45909 Unspecified asthma, uncomplicated: Secondary | ICD-10-CM | POA: Insufficient documentation

## 2016-04-30 DIAGNOSIS — Z79899 Other long term (current) drug therapy: Secondary | ICD-10-CM | POA: Insufficient documentation

## 2016-04-30 DIAGNOSIS — Y929 Unspecified place or not applicable: Secondary | ICD-10-CM | POA: Insufficient documentation

## 2016-04-30 DIAGNOSIS — Y939 Activity, unspecified: Secondary | ICD-10-CM | POA: Insufficient documentation

## 2016-04-30 DIAGNOSIS — S161XXA Strain of muscle, fascia and tendon at neck level, initial encounter: Secondary | ICD-10-CM

## 2016-04-30 MED ORDER — METHOCARBAMOL 500 MG PO TABS
500.0000 mg | ORAL_TABLET | Freq: Once | ORAL | Status: AC
  Administered 2016-04-30: 500 mg via ORAL
  Filled 2016-04-30: qty 1

## 2016-04-30 MED ORDER — METHOCARBAMOL 500 MG PO TABS: 500.0000 mg | ORAL_TABLET | Freq: Two times a day (BID) | ORAL | 0 refills | Status: DC

## 2016-04-30 NOTE — Discharge Instructions (Signed)
Please read and follow all provided instructions.  Your diagnoses today include:  1. Neck muscle strain, initial encounter     Tests performed today include: Vital signs. See below for your results today.   Medications prescribed:  Take as prescribed   Home care instructions:  Follow any educational materials contained in this packet.  Follow-up instructions: Please follow-up with your primary care provider for further evaluation of symptoms and treatment   Return instructions:  Please return to the Emergency Department if you do not get better, if you get worse, or new symptoms OR  - Fever (temperature greater than 101.52F)  - Bleeding that does not stop with holding pressure to the area    -Severe pain (please note that you may be more sore the day after your accident)  - Chest Pain  - Difficulty breathing  - Severe nausea or vomiting  - Inability to tolerate food and liquids  - Passing out  - Skin becoming red around your wounds  - Change in mental status (confusion or lethargy)  - New numbness or weakness    Please return if you have any other emergent concerns.  Additional Information:  Your vital signs today were: BP 141/92 (BP Location: Left Arm)    Pulse 89    Temp 98.3 F (36.8 C) (Oral)    Resp 16    Ht 5\' 2"  (1.575 m)    Wt 77.6 kg    SpO2 100%    BMI 31.28 kg/m  If your blood pressure (BP) was elevated above 135/85 this visit, please have this repeated by your doctor within one month. ---------------

## 2016-04-30 NOTE — ED Triage Notes (Signed)
Patient complaining of neck pain on the right side. The patient states that it started at [place] today.

## 2016-04-30 NOTE — ED Provider Notes (Signed)
[facility] DEPT Provider Note   CSN: [id number] Arrival date & time: 04/30/16  0156     History   Chief Complaint Chief Complaint  Patient presents with  . Neck Pain    HPI Faith Pope is a 25 y.o. female.  HPI  25 y.o. female presents to the Emergency Department today complaining of right sided neck pain x 3 days. Pt notes using new pillows for the past 3 days and worsening pain. Rates pain 9/10. Has not tried OTC relief. No N/V. No fevers. No fevers. No Trauma to area. No numbness/tingling. No problems with grip strength. No other symptoms noted.  Past Medical History:  Diagnosis Date  . Asthma     There are no active problems to display for this patient.   Past Surgical History:  Procedure Laterality Date  . TONSILLECTOMY      OB History    No data available       Home Medications    Prior to Admission medications   Medication Sig Start Date End Date Taking? Authorizing Provider  cetirizine-pseudoephedrine (ZYRTEC-D) 5-120 MG tablet Take 1 tablet by mouth 2 (two) times daily. 10/15/15   [name], PA-C  cyclobenzaprine (FLEXERIL) 10 MG tablet Take 1 tablet (10 mg total) by mouth 2 (two) times daily as needed for muscle spasms. 08/13/15   Charlann Lange, PA-C  doxycycline (VIBRAMYCIN) 100 MG capsule Take 1 capsule (100 mg total) by mouth 2 (two) times daily. Start on 10/18/15 if no improvement in symptoms 10/18/15   [name], PA-C  HYDROcodone-acetaminophen (NORCO/VICODIN) 5-325 MG tablet Take 1-2 tablets by mouth every 4 (four) hours as needed. 08/13/15   Charlann Lange, PA-C  ibuprofen (ADVIL,MOTRIN) 800 MG tablet Take 1 tablet (800 mg total) by mouth 3 (three) times daily. 08/13/15   Charlann Lange, PA-C  predniSONE (DELTASONE) 20 MG tablet Take 2 tablets (40 mg total) by mouth daily. 10/15/15   [name], PA-C  traMADol (ULTRAM) 50 MG tablet Take 1 tablet (50 mg total) by mouth every 6 (six) hours as needed. Patient not taking: Reported on 08/13/2015 12/02/14    Monico Blitz, PA-C    Family History History reviewed. No pertinent family history.  Social History Social History  Substance Use Topics  . Smoking status: Current Every Day Smoker    Packs/day: 1.00    Types: Cigarettes  . Smokeless tobacco: Never Used  . Alcohol use Yes     Comment: "Not really ever"     Allergies   Shellfish allergy and Penicillins   Review of Systems Review of Systems  Constitutional: Negative for fever.  Gastrointestinal: Negative for nausea and vomiting.  Musculoskeletal: Positive for myalgias and neck pain.  Neurological: Negative for syncope and headaches.   Physical Exam Updated Vital Signs BP 141/92 (BP Location: Left Arm)   Pulse 89   Temp 98.3 F (36.8 C) (Oral)   Resp 16   Ht 5\' 2"  (1.575 m)   Wt 77.6 kg   SpO2 100%   BMI 31.28 kg/m   Physical Exam  Constitutional: She is oriented to person, place, and time. Vital signs are normal. She appears well-developed and well-nourished.  HENT:  Head: Normocephalic.  Right Ear: Hearing normal.  Left Ear: Hearing normal.  Eyes: Conjunctivae and EOM are normal. Pupils are equal, round, and reactive to light.  Neck: Trachea normal, normal range of motion, full passive range of motion without pain and phonation normal. Neck supple. Muscular tenderness present. No spinous process tenderness present.  No neck rigidity. No edema and no erythema present. No Brudzinski's sign and no Kernig's sign noted.  Cardiovascular: Normal rate, regular rhythm, normal heart sounds and intact distal pulses.   Pulmonary/Chest: Effort normal and breath sounds normal. No stridor. No respiratory distress. She has no wheezes. She has no rales. She exhibits no tenderness.  Neurological: She is alert and oriented to person, place, and time.  Skin: Skin is warm and dry.  Psychiatric: She has a normal mood and affect. Her speech is normal and behavior is normal. Thought content normal.   ED Treatments / Results   Labs (all labs ordered are listed, but only abnormal results are displayed) Labs Reviewed - No data to display  EKG  EKG Interpretation None      Radiology No results found.  Procedures Procedures (including critical care time)  Medications Ordered in ED Medications - No data to display   Initial Impression / Assessment and Plan / ED Course  I have reviewed the triage vital signs and the nursing notes.  Pertinent labs & imaging results that were available during my care of the patient were reviewed by me and considered in my medical decision making (see chart for details).  Clinical Course   Final Clinical Impressions(s) / ED Diagnoses  I have reviewed the relevant previous healthcare records. I obtained HPI from historian.  ED Course:  Assessment: Pt is a 25yF who presents with right sided neck pain x 3 days. Endorse new pillow when pain started. Has not tried OTC relief. On exam, pt in NAD. Nontoxic/nonseptic appearing. VSS. Afebrile. Lungs CTA. Heart RRR. No C spine tenderness. TTP along right musculature.. Given Robaxin in ED. Likely musculoskeletal in etiology Plan is to DC home with Robaxin and follow up to PCP. Counseled on heat and NSAID therapy. At time of discharge, Patient is in no acute distress. Vital Signs are stable. Patient is able to ambulate. Patient able to tolerate PO.    Disposition/Plan:  DC Home Additional Verbal discharge instructions given and discussed with patient.  Pt Instructed to f/u with PCP in the next week for evaluation and treatment of symptoms. Return precautions given Pt acknowledges and agrees with plan  Supervising Physician April Palumbo, MD   Final diagnoses:  Neck muscle strain, initial encounter    New Prescriptions New Prescriptions   No medications on file     Shary Decamp, PA-C 04/30/16 0221    April Palumbo, MD 04/30/16 [phone]

## 2016-05-04 ENCOUNTER — Encounter (HOSPITAL_COMMUNITY): Payer: Self-pay | Admitting: *Deleted

## 2016-05-04 ENCOUNTER — Emergency Department (HOSPITAL_COMMUNITY)
Admission: EM | Admit: 2016-05-04 | Discharge: 2016-05-04 | Disposition: A | Discharge status: Home / Self Care | Payer: Medicaid Other | Attending: Emergency Medicine | Admitting: Emergency Medicine

## 2016-05-04 DIAGNOSIS — F17210 Nicotine dependence, cigarettes, uncomplicated: Secondary | ICD-10-CM | POA: Insufficient documentation

## 2016-05-04 DIAGNOSIS — R197 Diarrhea, unspecified: Secondary | ICD-10-CM | POA: Insufficient documentation

## 2016-05-04 DIAGNOSIS — Z79899 Other long term (current) drug therapy: Secondary | ICD-10-CM | POA: Insufficient documentation

## 2016-05-04 DIAGNOSIS — J45909 Unspecified asthma, uncomplicated: Secondary | ICD-10-CM | POA: Insufficient documentation

## 2016-05-04 MED ORDER — DICYCLOMINE HCL 20 MG PO TABS: 20.0000 mg | ORAL_TABLET | Freq: Two times a day (BID) | ORAL | 0 refills | Status: DC

## 2016-05-04 MED ORDER — LOPERAMIDE HCL 2 MG PO CAPS: 2.0000 mg | ORAL_CAPSULE | Freq: Four times a day (QID) | ORAL | 0 refills | Status: DC | PRN

## 2016-05-04 MED ORDER — LOPERAMIDE HCL 2 MG PO CAPS
4.0000 mg | ORAL_CAPSULE | Freq: Once | ORAL | Status: AC
  Administered 2016-05-04: 4 mg via ORAL
  Filled 2016-05-04: qty 2

## 2016-05-04 MED ORDER — DICYCLOMINE HCL 10 MG PO CAPS
10.0000 mg | ORAL_CAPSULE | Freq: Once | ORAL | Status: AC
  Administered 2016-05-04: 10 mg via ORAL
  Filled 2016-05-04: qty 1

## 2016-05-04 NOTE — ED Triage Notes (Signed)
The pt has had diarrhea since Monday  No n  V  She has been taking pepto bismol  Without relief.  lmp last month

## 2016-05-04 NOTE — ED Provider Notes (Signed)
[facility]-EMERGENCY DEPT Provider Note   CSN: [id number] Arrival date & time: 05/04/16  0132     History   Chief Complaint Chief Complaint  Patient presents with  . Diarrhea    HPI Faith Pope is a 25 y.o. female.  HPI  Patient has a PMH of asthma comes to there ER with 4 day history of crampy diarrhea. She has been taking Pepto Bismol for it without any relief. She was recently in the ER on 9/5 for shoulder dislocation. She denies having any nausea or vomiting, denies any blood in her stools or abdominal pains. Denies dysuria or vaginal bleeding/discharge. She has not had any fevers or weakness.  Past Medical History:  Diagnosis Date  . Asthma     There are no active problems to display for this patient.   Past Surgical History:  Procedure Laterality Date  . TONSILLECTOMY      OB History    No data available       Home Medications    Prior to Admission medications   Medication Sig Start Date End Date Taking? Authorizing Provider  cetirizine-pseudoephedrine (ZYRTEC-D) 5-120 MG tablet Take 1 tablet by mouth 2 (two) times daily. Patient not taking: Reported on 05/04/2016 10/15/15   Antony Madura, PA-C  cyclobenzaprine (FLEXERIL) 10 MG tablet Take 1 tablet (10 mg total) by mouth 2 (two) times daily as needed for muscle spasms. Patient not taking: Reported on 05/04/2016 08/13/15   Elpidio Anis, PA-C  dicyclomine (BENTYL) 20 MG tablet Take 1 tablet (20 mg total) by mouth 2 (two) times daily. 05/04/16   Halston Kintz Neva Seat, PA-C  doxycycline (VIBRAMYCIN) 100 MG capsule Take 1 capsule (100 mg total) by mouth 2 (two) times daily. Start on 10/18/15 if no improvement in symptoms Patient not taking: Reported on 05/04/2016 10/18/15   Antony Madura, PA-C  HYDROcodone-acetaminophen (NORCO/VICODIN) 5-325 MG tablet Take 1-2 tablets by mouth every 4 (four) hours as needed. Patient not taking: Reported on 05/04/2016 08/13/15   Elpidio Anis, PA-C  ibuprofen (ADVIL,MOTRIN) 800 MG tablet Take 1 tablet (800 mg  total) by mouth 3 (three) times daily. Patient not taking: Reported on 05/04/2016 08/13/15   Elpidio Anis, PA-C  loperamide (IMODIUM) 2 MG capsule Take 1 capsule (2 mg total) by mouth 4 (four) times daily as needed for diarrhea or loose stools. 05/04/16   Brandonlee Navis Neva Seat, PA-C  methocarbamol (ROBAXIN) 500 MG tablet Take 1 tablet (500 mg total) by mouth 2 (two) times daily. Patient not taking: Reported on 05/04/2016 04/30/16   Audry Pili, PA-C  predniSONE (DELTASONE) 20 MG tablet Take 2 tablets (40 mg total) by mouth daily. Patient not taking: Reported on 05/04/2016 10/15/15   Antony Madura, PA-C  traMADol (ULTRAM) 50 MG tablet Take 1 tablet (50 mg total) by mouth every 6 (six) hours as needed. Patient not taking: Reported on 08/13/2015 12/02/14   Wynetta Emery, PA-C    Family History No family history on file.  Social History Social History  Substance Use Topics  . Smoking status: Current Every Day Smoker    Packs/day: 1.00    Types: Cigarettes  . Smokeless tobacco: Never Used  . Alcohol use Yes     Comment: "Not really ever"     Allergies   Shellfish allergy and Penicillins   Review of Systems Review of Systems Review of Systems All other systems negative except as documented in the HPI. All pertinent positives and negatives as reviewed in the HPI.   Physical Exam Updated Vital Signs BP  125/83 (BP Location: Left Arm)   Pulse 98   Temp 99.1 F (37.3 C) (Oral)   Resp 16   Ht 5\' 2"  (1.575 m)   Wt 76.7 kg   LMP 04/03/2016   SpO2 99%   BMI 30.91 kg/m   Physical Exam  Constitutional: She appears well-developed and well-nourished. No distress.  HENT:  Head: Normocephalic and atraumatic.  Eyes: Pupils are equal, round, and reactive to light.  Neck: Normal range of motion. Neck supple.  Cardiovascular: Normal rate and regular rhythm.   Pulmonary/Chest: Effort normal.  Abdominal: Soft. She exhibits no distension. Bowel sounds are increased. There is no tenderness. There is no  rigidity, no rebound, no guarding and no CVA tenderness.  Neurological: She is alert.  Skin: Skin is warm and dry.  Nursing note and vitals reviewed.    ED Treatments / Results  Labs (all labs ordered are listed, but only abnormal results are displayed) Labs Reviewed - No data to display  EKG  EKG Interpretation None       Radiology No results found.  Procedures Procedures (including critical care time)  Medications Ordered in ED Medications  dicyclomine (BENTYL) capsule 10 mg (10 mg Oral Given 05/04/16 0456)  loperamide (IMODIUM) capsule 4 mg (4 mg Oral Given 05/04/16 0457)   Patient given PO meds in the ER and her diarrhea improved. On arrival she reported having to use the bathroom every 5 minutes for bowel movements. She has had no pains or fever. She is healthy at baseline. Given strict return to ED precautions.  Initial Impression / Assessment and Plan / ED Course  I have reviewed the triage vital signs and the nursing notes.  Pertinent labs & imaging results that were available during my care of the patient were reviewed by me and considered in my medical decision making (see chart for details).  Clinical Course    Final Clinical Impressions(s) / ED Diagnoses   Final diagnoses:  Diarrhea, unspecified type    New Prescriptions New Prescriptions   DICYCLOMINE (BENTYL) 20 MG TABLET    Take 1 tablet (20 mg total) by mouth 2 (two) times daily.   LOPERAMIDE (IMODIUM) 2 MG CAPSULE    Take 1 capsule (2 mg total) by mouth 4 (four) times daily as needed for diarrhea or loose stools.     Delos Haring, PA-C 05/04/16 [place], DO 05/04/16 [phone]

## 2016-06-08 ENCOUNTER — Encounter (HOSPITAL_COMMUNITY): Payer: Self-pay | Admitting: Emergency Medicine

## 2016-06-08 ENCOUNTER — Emergency Department (HOSPITAL_COMMUNITY)
Admission: EM | Admit: 2016-06-08 | Discharge: 2016-06-08 | Disposition: A | Discharge status: Home / Self Care | Payer: Medicaid Other | Attending: Emergency Medicine | Admitting: Emergency Medicine

## 2016-06-08 DIAGNOSIS — K0889 Other specified disorders of teeth and supporting structures: Secondary | ICD-10-CM

## 2016-06-08 DIAGNOSIS — F17210 Nicotine dependence, cigarettes, uncomplicated: Secondary | ICD-10-CM | POA: Insufficient documentation

## 2016-06-08 DIAGNOSIS — Z79899 Other long term (current) drug therapy: Secondary | ICD-10-CM | POA: Insufficient documentation

## 2016-06-08 DIAGNOSIS — J45909 Unspecified asthma, uncomplicated: Secondary | ICD-10-CM | POA: Insufficient documentation

## 2016-06-08 DIAGNOSIS — K029 Dental caries, unspecified: Secondary | ICD-10-CM

## 2016-06-08 MED ORDER — CLINDAMYCIN HCL 150 MG PO CAPS: 300.0000 mg | ORAL_CAPSULE | Freq: Three times a day (TID) | ORAL | 0 refills | Status: DC

## 2016-06-08 MED ORDER — NAPROXEN 500 MG PO TABS: 500.0000 mg | ORAL_TABLET | Freq: Two times a day (BID) | ORAL | 0 refills | Status: DC

## 2016-06-08 MED ORDER — NAPROXEN 250 MG PO TABS
250.0000 mg | ORAL_TABLET | Freq: Once | ORAL | Status: AC
  Administered 2016-06-08: 250 mg via ORAL
  Filled 2016-06-08: qty 1

## 2016-06-08 NOTE — ED Triage Notes (Signed)
Pt. reports chronic left upper molar pain for several months unrelieved by OTC pain medication .

## 2016-06-08 NOTE — ED Provider Notes (Signed)
[place] DEPT Provider Note   CSN: [id number] Arrival date & time: 06/08/16  2152  By signing my name below, I, Evelene Croon, attest that this documentation has been prepared under the direction and in the presence of non-physician practitioner, Waynetta Pean, PA-C. Electronically Signed: Evelene Croon, Scribe. 06/08/2016. 10:44 PM.  History   Chief Complaint Chief Complaint  Patient presents with  . Dental Pain     The history is provided by the patient. No language interpreter was used.     HPI Comments:  Faith Pope is a 25 y.o. female who presents to the Emergency Department complaining of left sided dental pain x a few months; worsened ~ 1.5 weeks ago. She has taken Advil with minimal relief.  She denies fever, sour drainage in her mouth, double vision, neck stiffness, and difficulty swallowing. Pt has no other complaints or symptoms at this time.    Past Medical History:  Diagnosis Date  . Asthma     There are no active problems to display for this patient.   Past Surgical History:  Procedure Laterality Date  . TONSILLECTOMY      OB History    No data available       Home Medications    Prior to Admission medications   Medication Sig Start Date End Date Taking? Authorizing Provider  cetirizine-pseudoephedrine (ZYRTEC-D) 5-120 MG tablet Take 1 tablet by mouth 2 (two) times daily. Patient not taking: Reported on 05/04/2016 10/15/15   [name], PA-C  clindamycin (CLEOCIN) 150 MG capsule Take 2 capsules (300 mg total) by mouth 3 (three) times daily. May dispense as 150mg  capsules 06/08/16   Waynetta Pean, PA-C  cyclobenzaprine (FLEXERIL) 10 MG tablet Take 1 tablet (10 mg total) by mouth 2 (two) times daily as needed for muscle spasms. Patient not taking: Reported on 05/04/2016 08/13/15   Charlann Lange, PA-C  dicyclomine (BENTYL) 20 MG tablet Take 1 tablet (20 mg total) by mouth 2 (two) times daily. 05/04/16   Tiffany Carlota Raspberry, PA-C  doxycycline (VIBRAMYCIN)  100 MG capsule Take 1 capsule (100 mg total) by mouth 2 (two) times daily. Start on 10/18/15 if no improvement in symptoms Patient not taking: Reported on 05/04/2016 10/18/15   [name], PA-C  HYDROcodone-acetaminophen (NORCO/VICODIN) 5-325 MG tablet Take 1-2 tablets by mouth every 4 (four) hours as needed. Patient not taking: Reported on 05/04/2016 08/13/15   Charlann Lange, PA-C  ibuprofen (ADVIL,MOTRIN) 800 MG tablet Take 1 tablet (800 mg total) by mouth 3 (three) times daily. Patient not taking: Reported on 05/04/2016 08/13/15   Charlann Lange, PA-C  loperamide (IMODIUM) 2 MG capsule Take 1 capsule (2 mg total) by mouth 4 (four) times daily as needed for diarrhea or loose stools. 05/04/16   Tiffany Carlota Raspberry, PA-C  methocarbamol (ROBAXIN) 500 MG tablet Take 1 tablet (500 mg total) by mouth 2 (two) times daily. Patient not taking: Reported on 05/04/2016 04/30/16   Shary Decamp, PA-C  naproxen (NAPROSYN) 500 MG tablet Take 1 tablet (500 mg total) by mouth 2 (two) times daily with a meal. 06/08/16   Waynetta Pean, PA-C  predniSONE (DELTASONE) 20 MG tablet Take 2 tablets (40 mg total) by mouth daily. Patient not taking: Reported on 05/04/2016 10/15/15   [name], PA-C  traMADol (ULTRAM) 50 MG tablet Take 1 tablet (50 mg total) by mouth every 6 (six) hours as needed. Patient not taking: Reported on 08/13/2015 12/02/14   Monico Blitz, PA-C    Family History No family history on file.  Social History Social History  Substance Use Topics  . Smoking status: Current Every Day Smoker    Packs/day: 1.00    Types: Cigarettes  . Smokeless tobacco: Never Used  . Alcohol use Yes     Comment: "Not really ever"     Allergies   Shellfish allergy and Penicillins   Review of Systems Review of Systems  Constitutional: Negative for chills and fever.  HENT: Positive for dental problem. Negative for mouth sores and trouble swallowing.   Eyes: Negative for visual disturbance.  Respiratory: Negative for shortness  of breath.   Cardiovascular: Negative for chest pain.  Gastrointestinal: Negative for vomiting.  Musculoskeletal: Negative for neck stiffness.     Physical Exam Updated Vital Signs BP (!) 141/103 (BP Location: Left Arm)   Pulse 92   Temp 99.4 F (37.4 C) (Oral)   Resp 18   Ht 5\' 2"  (1.575 m)   Wt 169 lb (76.7 kg)   LMP 05/28/2016   SpO2 100%   BMI 30.91 kg/m   Physical Exam  Constitutional: She appears well-developed and well-nourished. No distress.  Non-toxic appearing.   HENT:  Head: Normocephalic and atraumatic.  Right Ear: External ear normal.  Left Ear: External ear normal.  Mouth/Throat: Oropharynx is clear and moist. No oropharyngeal exudate.  Dental caries noted  to left upper and left lower teeth No trismus. No drooling. No discharge from the mouth. No facial swelling. Uvula is midline without edema. Soft palate rises symmetrically. No tonsillar hypertrophy or exudates. Tongue protrusion is normal.  No abscess.  Bilateral tympanic membranes are pearly-gray without erythema or loss of landmarks.   Eyes: Conjunctivae are normal. Pupils are equal, round, and reactive to light. Right eye exhibits no discharge. Left eye exhibits no discharge.  Neck: Normal range of motion. Neck supple. No JVD present. No tracheal deviation present.  Cardiovascular: Normal rate and intact distal pulses.   Pulmonary/Chest: Effort normal. No respiratory distress.  Abdominal: Soft. There is no tenderness.  Lymphadenopathy:    She has no cervical adenopathy.  Neurological: She is alert. Coordination normal.  Skin: Skin is warm and dry. Capillary refill takes less than 2 seconds. No rash noted. She is not diaphoretic. No erythema. No pallor.  Psychiatric: She has a normal mood and affect. Her behavior is normal.  Nursing note and vitals reviewed.    ED Treatments / Results  DIAGNOSTIC STUDIES:  Oxygen Saturation is 100% on RA, normal by my interpretation.    COORDINATION OF  CARE:  10:40 PM Discussed treatment plan with pt at bedside and pt agreed to plan.  Labs (all labs ordered are listed, but only abnormal results are displayed) Labs Reviewed - No data to display  EKG  EKG Interpretation None       Radiology No results found.  Procedures Procedures (including critical care time)  Medications Ordered in ED Medications - No data to display   Initial Impression / Assessment and Plan / ED Course  I have reviewed the triage vital signs and the nursing notes.  Pertinent labs & imaging results that were available during my care of the patient were reviewed by me and considered in my medical decision making (see chart for details).  Clinical Course      Patient with left upper and lower dentalgia.  No abscess requiring immediate incision and drainage.  Exam not concerning for Ludwig's angina or pharyngeal abscess.  Will treat with clindamycin due to PCN allergy.  Pt instructed to follow-up with  dentist Dr. Maurilio Lovely.  Discussed return precautions. Pt safe for discharge. I advised the patient to follow-up with their primary care provider this week. I advised the patient to return to the emergency department with new or worsening symptoms or new concerns. The patient verbalized understanding and agreement with plan.    Final Clinical Impressions(s) / ED Diagnoses   Final diagnoses:  Dental caries  Toothache    New Prescriptions New Prescriptions   CLINDAMYCIN (CLEOCIN) 150 MG CAPSULE    Take 2 capsules (300 mg total) by mouth 3 (three) times daily. May dispense as 150mg  capsules   NAPROXEN (NAPROSYN) 500 MG TABLET    Take 1 tablet (500 mg total) by mouth 2 (two) times daily with a meal.   I personally performed the services described in this documentation, which was scribed in my presence. The recorded information has been reviewed and is accurate.       Waynetta Pean, PA-C 06/08/16 2259    Forde Dandy, MD 06/09/16 0111

## 2016-06-08 NOTE — ED Notes (Signed)
Pt understood dc material and follow up information. NAD noted. Scripts given at [facility]

## 2017-02-11 ENCOUNTER — Emergency Department (HOSPITAL_COMMUNITY)
Admission: EM | Admit: 2017-02-11 | Discharge: 2017-02-11 | Disposition: A | Discharge status: Home / Self Care | Payer: Self-pay | Attending: Emergency Medicine | Admitting: Emergency Medicine

## 2017-02-11 ENCOUNTER — Encounter (HOSPITAL_COMMUNITY): Payer: Self-pay | Admitting: Obstetrics and Gynecology

## 2017-02-11 ENCOUNTER — Emergency Department (HOSPITAL_COMMUNITY): Payer: Self-pay

## 2017-02-11 DIAGNOSIS — J45909 Unspecified asthma, uncomplicated: Secondary | ICD-10-CM | POA: Insufficient documentation

## 2017-02-11 DIAGNOSIS — J219 Acute bronchiolitis, unspecified: Secondary | ICD-10-CM | POA: Insufficient documentation

## 2017-02-11 DIAGNOSIS — F17210 Nicotine dependence, cigarettes, uncomplicated: Secondary | ICD-10-CM | POA: Insufficient documentation

## 2017-02-11 LAB — RAPID STREP SCREEN (MED CTR MEBANE ONLY): STREPTOCOCCUS, GROUP A SCREEN (DIRECT): NEGATIVE

## 2017-02-11 MED ORDER — ALBUTEROL SULFATE HFA 108 (90 BASE) MCG/ACT IN AERS
2.0000 | INHALATION_SPRAY | Freq: Once | RESPIRATORY_TRACT | Status: AC
  Administered 2017-02-11: 2 via RESPIRATORY_TRACT
  Filled 2017-02-11: qty 6.7

## 2017-02-11 MED ORDER — BENZONATATE 100 MG PO CAPS: 100.0000 mg | ORAL_CAPSULE | Freq: Three times a day (TID) | ORAL | 0 refills | Status: DC

## 2017-02-11 MED ORDER — PREDNISONE 20 MG PO TABS: 40.0000 mg | ORAL_TABLET | Freq: Every day | ORAL | 0 refills | Status: DC

## 2017-02-11 NOTE — ED Notes (Signed)
Pt called out, requesting for something for cough.  Made her aware that she is waiting for a provider to see her and they are the one to order cough med.

## 2017-02-11 NOTE — Discharge Instructions (Signed)
Inhaler 2 puffs every 4 hrs. Take prednisone as prescribed until all gone. Tessalon for cough. Follow up with family doctor if not improving in 3-5 days

## 2017-02-11 NOTE — ED Provider Notes (Signed)
[place] DEPT Provider Note   CSN: [id number] Arrival date & time: 02/11/17  [place]     History   Chief Complaint Chief Complaint  Patient presents with  . Sore Throat    HPI Faith Pope is a 26 y.o. female.  HPI Faith Pope is a 26 y.o. female history of asthma, presents to emergency department complaining of sore throat, cough, congestion. Patient states that her symptoms started 2 days ago. She states it started after sleeping under cold air-conditioning as well as directly on her. She states cough is nonproductive, but reports associated pain in her chest and shortness of breath. States today he had no appetite, did eat some soup for lunch, and went to sleep and just woke up feeling short of breath. Patient is a smoker. She did not try any medications other than Benadryl last night prior to coming in. Denies any difficulty swallowing. She states her voice is hoarse. Denies any nausea or vomiting. She has had some loose stools. Denies any abdominal pain. No other associated symptoms  Past Medical History:  Diagnosis Date  . Asthma     There are no active problems to display for this patient.   Past Surgical History:  Procedure Laterality Date  . TONSILLECTOMY      OB History    No data available       Home Medications    Prior to Admission medications   Medication Sig Start Date End Date Taking? Authorizing Provider  cetirizine-pseudoephedrine (ZYRTEC-D) 5-120 MG tablet Take 1 tablet by mouth 2 (two) times daily. Patient not taking: Reported on 05/04/2016 10/15/15   [name], PA-C  clindamycin (CLEOCIN) 150 MG capsule Take 2 capsules (300 mg total) by mouth 3 (three) times daily. May dispense as 150mg  capsules 06/08/16   Waynetta Pean, PA-C  cyclobenzaprine (FLEXERIL) 10 MG tablet Take 1 tablet (10 mg total) by mouth 2 (two) times daily as needed for muscle spasms. Patient not taking: Reported on 05/04/2016 08/13/15   Charlann Lange, PA-C  dicyclomine (BENTYL)  20 MG tablet Take 1 tablet (20 mg total) by mouth 2 (two) times daily. 05/04/16   Delos Haring, PA-C  doxycycline (VIBRAMYCIN) 100 MG capsule Take 1 capsule (100 mg total) by mouth 2 (two) times daily. Start on 10/18/15 if no improvement in symptoms Patient not taking: Reported on 05/04/2016 10/18/15   [name], PA-C  HYDROcodone-acetaminophen (NORCO/VICODIN) 5-325 MG tablet Take 1-2 tablets by mouth every 4 (four) hours as needed. Patient not taking: Reported on 05/04/2016 08/13/15   Charlann Lange, PA-C  ibuprofen (ADVIL,MOTRIN) 800 MG tablet Take 1 tablet (800 mg total) by mouth 3 (three) times daily. Patient not taking: Reported on 05/04/2016 08/13/15   Charlann Lange, PA-C  loperamide (IMODIUM) 2 MG capsule Take 1 capsule (2 mg total) by mouth 4 (four) times daily as needed for diarrhea or loose stools. 05/04/16   Delos Haring, PA-C  methocarbamol (ROBAXIN) 500 MG tablet Take 1 tablet (500 mg total) by mouth 2 (two) times daily. Patient not taking: Reported on 05/04/2016 04/30/16   Shary Decamp, PA-C  naproxen (NAPROSYN) 500 MG tablet Take 1 tablet (500 mg total) by mouth 2 (two) times daily with a meal. 06/08/16   Waynetta Pean, PA-C  predniSONE (DELTASONE) 20 MG tablet Take 2 tablets (40 mg total) by mouth daily. Patient not taking: Reported on 05/04/2016 10/15/15   [name], PA-C  traMADol (ULTRAM) 50 MG tablet Take 1 tablet (50 mg total) by mouth every 6 (six) hours  as needed. Patient not taking: Reported on 08/13/2015 12/02/14   Pisciotta, Charna Elizabeth    Family History History reviewed. No pertinent family history.  Social History Social History  Substance Use Topics  . Smoking status: Current Every Day Smoker    Packs/day: 1.00    Types: Cigarettes  . Smokeless tobacco: Never Used  . Alcohol use Yes     Comment: "Not really ever"     Allergies   Shellfish allergy and Penicillins   Review of Systems Review of Systems  Constitutional: Negative for chills and fever.  HENT:  Positive for congestion and sore throat. Negative for trouble swallowing.   Respiratory: Positive for cough, chest tightness and shortness of breath.   Cardiovascular: Positive for chest pain. Negative for palpitations and leg swelling.  Gastrointestinal: Negative for abdominal pain, diarrhea, nausea and vomiting.  Genitourinary: Negative for dysuria, flank pain, pelvic pain, vaginal bleeding, vaginal discharge and vaginal pain.  Musculoskeletal: Negative for arthralgias, myalgias, neck pain and neck stiffness.  Skin: Negative for rash.  Neurological: Negative for dizziness, weakness and headaches.  All other systems reviewed and are negative.    Physical Exam Updated Vital Signs BP 120/85 (BP Location: Left Arm)   Pulse 88   Temp 98.8 F (37.1 C) (Oral)   Resp 16   Ht 5\' 2"  (1.575 m)   Wt 73.9 kg (163 lb)   LMP 02/04/2017 (Approximate)   SpO2 98%   BMI 29.81 kg/m   Physical Exam  Constitutional: She is oriented to person, place, and time. She appears well-developed and well-nourished. No distress.  HENT:  Head: Normocephalic and atraumatic.  Right Ear: Tympanic membrane, external ear and ear canal normal.  Left Ear: Tympanic membrane, external ear and ear canal normal.  Nose: Mucosal edema and rhinorrhea present.  Mouth/Throat: Uvula is midline and mucous membranes are normal. Posterior oropharyngeal erythema present. No oropharyngeal exudate, posterior oropharyngeal edema or tonsillar abscesses.  Eyes: Conjunctivae are normal.  Neck: Neck supple.  Cardiovascular: Normal rate, regular rhythm, normal heart sounds and intact distal pulses.   Pulmonary/Chest: Effort normal and breath sounds normal. No respiratory distress. She has no wheezes. She has no rales.  Abdominal: She exhibits no distension.  Musculoskeletal: Normal range of motion.  Neurological: She is alert and oriented to person, place, and time.  Skin: Skin is warm and dry.  Psychiatric: She has a normal mood and  affect.  Nursing note and vitals reviewed.    ED Treatments / Results  Labs (all labs ordered are listed, but only abnormal results are displayed) Labs Reviewed  RAPID STREP SCREEN (NOT AT [facility])  CULTURE, GROUP A STREP [facility])    EKG  EKG Interpretation None       Radiology Dg Chest 2 View  Result Date: 02/11/2017 CLINICAL DATA:  Initial evaluation for acute cough, congestion, sore throat, shortness of breath. EXAM: CHEST  2 VIEW COMPARISON:  None. FINDINGS: The cardiac and mediastinal silhouettes are within normal limits. The lungs are normally inflated. Mild scattered peribronchial thickening within the right lower lobe, suspicious for possible bronchiolitis. No airspace consolidation, pleural effusion, or pulmonary edema is identified. There is no pneumothorax. No acute osseous abnormality identified. IMPRESSION: Mild scattered peribronchial thickening within the right lower lobe, suspicious for possible bronchiolitis. No radiographic evidence for alveolar pneumonia. Electronically Signed   By: Jeannine Boga M.D.   On: 02/11/2017 21:36    Procedures Procedures (including critical care time)  Medications Ordered in ED Medications  albuterol (PROVENTIL HFA;VENTOLIN  HFA) 108 (90 Base) MCG/ACT inhaler 2 puff (not administered)     Initial Impression / Assessment and Plan / ED Course  I have reviewed the triage vital signs and the nursing notes.  Pertinent labs & imaging results that were available during my care of the patient were reviewed by me and considered in my medical decision making (see chart for details).     Patient in emergency department with sore throat, cough, wheezing, hoarse voice for the last 2-3 days. On exam at this time I do not hear any wheezing, color patient is coughing with spastic cough. We'll get a chest x-ray due to shortness of breath and chest pain. Rapid strep were obtained and is negative.   9:54 PM Patient's chest x-ray showing  possible bronchiolitis. I will give inhaler, prednisone for 5 days. Tessalon for cough. [place] gargles. Tylenol Motrin, rest, fluids. Follow-up with family doctor as needed. Most likely viral versus inflammatory process. She is afebrile, nontoxic appearing, normal vital signs.  Vitals:   02/11/17 1846 02/11/17 1847  BP: 120/85   Pulse: 88   Resp: 16   Temp: 98.8 F (37.1 C)   TempSrc: Oral   SpO2: 98%   Weight:  73.9 kg (163 lb)  Height:  5\' 2"  (1.575 m)     Final Clinical Impressions(s) / ED Diagnoses   Final diagnoses:  Bronchiolitis    New Prescriptions New Prescriptions   BENZONATATE (TESSALON) 100 MG CAPSULE    Take 1 capsule (100 mg total) by mouth every 8 (eight) hours.   PREDNISONE (DELTASONE) 20 MG TABLET    Take 2 tablets (40 mg total) by mouth daily.     Jeannett Senior, PA-C 02/11/17 2155    Orlie Dakin, MD 02/12/17 [phone]

## 2017-02-11 NOTE — ED Triage Notes (Signed)
Pt states she had her AC unit turned up and she woke up sick the next morning. Pt states her throat hurts, denies any N/V and 2 episodes of diarrhea.

## 2017-02-14 LAB — CULTURE, GROUP A STREP (THRC)

## 2017-06-19 DIAGNOSIS — F17210 Nicotine dependence, cigarettes, uncomplicated: Secondary | ICD-10-CM | POA: Insufficient documentation

## 2017-06-19 DIAGNOSIS — J45909 Unspecified asthma, uncomplicated: Secondary | ICD-10-CM | POA: Insufficient documentation

## 2017-06-19 DIAGNOSIS — Z79899 Other long term (current) drug therapy: Secondary | ICD-10-CM | POA: Insufficient documentation

## 2017-06-19 DIAGNOSIS — M25562 Pain in left knee: Secondary | ICD-10-CM | POA: Insufficient documentation

## 2017-06-20 ENCOUNTER — Emergency Department (HOSPITAL_COMMUNITY)
Admission: EM | Admit: 2017-06-20 | Discharge: 2017-06-20 | Disposition: A | Discharge status: Home / Self Care | Payer: Self-pay | Attending: Emergency Medicine | Admitting: Emergency Medicine

## 2017-06-20 ENCOUNTER — Emergency Department (HOSPITAL_COMMUNITY): Payer: Self-pay

## 2017-06-20 ENCOUNTER — Encounter (HOSPITAL_COMMUNITY): Payer: Self-pay | Admitting: Emergency Medicine

## 2017-06-20 DIAGNOSIS — M25562 Pain in left knee: Secondary | ICD-10-CM

## 2017-06-20 MED ORDER — IBUPROFEN 800 MG PO TABS: 800.0000 mg | ORAL_TABLET | Freq: Three times a day (TID) | ORAL | 0 refills | Status: DC | PRN

## 2017-06-20 MED ORDER — IBUPROFEN 800 MG PO TABS
800.0000 mg | ORAL_TABLET | Freq: Once | ORAL | Status: AC
  Administered 2017-06-20: 800 mg via ORAL
  Filled 2017-06-20: qty 1

## 2017-06-20 NOTE — ED Triage Notes (Signed)
Pt reports having swelling and pain to left knee for the last two days. Pt uncertain of any injury. Reports worsening pain with cold weather.

## 2017-06-20 NOTE — ED Notes (Signed)
Pt ambulatory and independent at discharge.  Verbalized understanding of discharge instructions 

## 2017-06-20 NOTE — ED Provider Notes (Signed)
TIME SEEN: 2:31 AM  CHIEF COMPLAINT: Left knee pain  HPI: Patient is a 26 year old female with history of asthma who presents emergency department with left knee pain for the past couple of days.  No new injury but states she has had problems with his left knee before.  States pain worse with full extension and ambulation.  Feels like she has some soft tissue swelling.  No erythema or warmth.  No fever.  No numbness or weakness.  No calf swelling or tenderness.  ROS: See HPI Constitutional: no fever  Eyes: no drainage  ENT: no runny nose   Cardiovascular:  no chest pain  Resp: no SOB  GI: no vomiting GU: no dysuria Integumentary: no rash  Allergy: no hives  Musculoskeletal: no leg swelling  Neurological: no slurred speech ROS otherwise negative  PAST MEDICAL HISTORY/PAST SURGICAL HISTORY:  Past Medical History:  Diagnosis Date  . Asthma     MEDICATIONS:  Prior to Admission medications   Medication Sig Start Date End Date Taking? Authorizing Provider  benzonatate (TESSALON) 100 MG capsule Take 1 capsule (100 mg total) by mouth every 8 (eight) hours. 02/11/17   Kirichenko, Tatyana, PA-C  cetirizine-pseudoephedrine (ZYRTEC-D) 5-120 MG tablet Take 1 tablet by mouth 2 (two) times daily. Patient not taking: Reported on 05/04/2016 10/15/15   [name], PA-C  clindamycin (CLEOCIN) 150 MG capsule Take 2 capsules (300 mg total) by mouth 3 (three) times daily. May dispense as 150mg  capsules 06/08/16   Waynetta Pean, PA-C  cyclobenzaprine (FLEXERIL) 10 MG tablet Take 1 tablet (10 mg total) by mouth 2 (two) times daily as needed for muscle spasms. Patient not taking: Reported on 05/04/2016 08/13/15   Charlann Lange, PA-C  dicyclomine (BENTYL) 20 MG tablet Take 1 tablet (20 mg total) by mouth 2 (two) times daily. 05/04/16   Delos Haring, PA-C  doxycycline (VIBRAMYCIN) 100 MG capsule Take 1 capsule (100 mg total) by mouth 2 (two) times daily. Start on 10/18/15 if no improvement in  symptoms Patient not taking: Reported on 05/04/2016 10/18/15   [name], PA-C  HYDROcodone-acetaminophen (NORCO/VICODIN) 5-325 MG tablet Take 1-2 tablets by mouth every 4 (four) hours as needed. Patient not taking: Reported on 05/04/2016 08/13/15   Charlann Lange, PA-C  ibuprofen (ADVIL,MOTRIN) 800 MG tablet Take 1 tablet (800 mg total) by mouth 3 (three) times daily. Patient not taking: Reported on 05/04/2016 08/13/15   Charlann Lange, PA-C  loperamide (IMODIUM) 2 MG capsule Take 1 capsule (2 mg total) by mouth 4 (four) times daily as needed for diarrhea or loose stools. 05/04/16   Delos Haring, PA-C  methocarbamol (ROBAXIN) 500 MG tablet Take 1 tablet (500 mg total) by mouth 2 (two) times daily. Patient not taking: Reported on 05/04/2016 04/30/16   Shary Decamp, PA-C  naproxen (NAPROSYN) 500 MG tablet Take 1 tablet (500 mg total) by mouth 2 (two) times daily with a meal. 06/08/16   Waynetta Pean, PA-C  predniSONE (DELTASONE) 20 MG tablet Take 2 tablets (40 mg total) by mouth daily. 02/11/17   Kirichenko, Tatyana, PA-C  traMADol (ULTRAM) 50 MG tablet Take 1 tablet (50 mg total) by mouth every 6 (six) hours as needed. Patient not taking: Reported on 08/13/2015 12/02/14   Pisciotta, Elmyra Ricks, PA-C    ALLERGIES:  Allergies  Allergen Reactions  . Shellfish Allergy Other (See Comments)    Mouth burns and tongue numb  . Amoxicillin Rash  . Penicillins Rash    Has patient had a PCN reaction causing immediate rash,  facial/tongue/throat swelling, SOB or lightheadedness with hypotension: rash Has patient had a PCN reaction causing severe rash involving mucus membranes or skin necrosis: No Has patient had a PCN reaction that required hospitalization: No Has patient had a PCN reaction occurring within the last 10 years: No If all of the above answers are "NO", then may proceed with Cephalosporin use.    SOCIAL HISTORY:  Social History  Substance Use Topics  . Smoking status: Current Every Day Smoker     Packs/day: 1.00    Types: Cigarettes  . Smokeless tobacco: Never Used  . Alcohol use Yes     Comment: "Not really ever"    FAMILY HISTORY: History reviewed. No pertinent family history.  EXAM: BP 139/83 (BP Location: Left Arm)   Pulse 78   Temp 98.3 F (36.8 C) (Oral)   Resp 16   Ht 5\' 2"  (1.575 m)   Wt 74.8 kg (165 lb)   LMP  (LMP Unknown) Comment: irregular periods  SpO2 100%   BMI 30.18 kg/m  CONSTITUTIONAL: Alert and oriented and responds appropriately to questions. Well-appearing; well-nourished HEAD: Normocephalic EYES: Conjunctivae clear, pupils appear equal, EOMI ENT: normal nose; moist mucous membranes NECK: Supple, no meningismus, no nuchal rigidity, no LAD  CARD: RRR; S1 and S2 appreciated; no murmurs, no clicks, no rubs, no gallops RESP: Normal chest excursion without splinting or tachypnea; breath sounds clear and equal bilaterally; no wheezes, no rhonchi, no rales, no hypoxia or respiratory distress, speaking full sentences ABD/GI: Normal bowel sounds; non-distended; soft, non-tender, no rebound, no guarding, no peritoneal signs, no hepatosplenomegaly BACK:   The back appears normal and is non-tender to palpation, there is no CVA tenderness EXT: Tender to palpation along the medial and lateral joint line of the left knee.  There is very minimal soft tissue swelling but no joint she has full range of motion of the left knee with full extension and flexion.  No ligamentous laxity noted.  2+ left DP pulse.  No calf tenderness or swelling of the left leg.  No erythema, warmth, induration or fluctuance of the left knee.  Compartments of the left leg are soft.  Normal sensation diffusely throughout the left leg.  Normal ROM in all joints; otherwise extremities are non-tender to palpation; no edema; normal capillary refill; no cyanosis, no calf tenderness or swelling    SKIN: Normal color for age and race; warm; no rash NEURO: Moves all extremities equally PSYCH: The  patient's mood and manner are appropriate. Grooming and personal hygiene are appropriate.  MEDICAL DECISION MAKING: Patient here with left knee pain.  Suspect previous meniscal injury.  States she is on her feet a lot at work.  She denies any new injury that she can recall.  No sign of septic arthritis, gout.  No fracture or joint effusion.  No compartment syndrome.  Neurovascular intact distally.  No sign of arterial obstruction.  Doubt DVT.  No fracture seen on x-ray.  Recommended alternating Tylenol and Motrin.  Will place her in a knee sleeve for compression and comfort.  Will give outpatient orthopedic follow-up if medical management as an outpatient does not improve her symptoms.  Discussed return precautions.  Will provide work note.  She is comfortable with this plan.   At this time, I do not feel there is any life-threatening condition present. I have reviewed and discussed all results (EKG, imaging, lab, urine as appropriate) and exam findings with patient/family. I have reviewed nursing notes and appropriate previous records.  I feel the patient is safe to be discharged home without further emergent workup and can continue workup as an outpatient as needed. Discussed usual and customary return precautions. Patient/family verbalize understanding and are comfortable with this plan.  Outpatient follow-up has been provided if needed. All questions have been answered.      Yomar Mejorado, Delice Bison, DO 06/20/17 [phone]

## 2017-06-20 NOTE — ED Notes (Signed)
Bed: WA06 Expected date:  Expected time:  Means of arrival:  Comments: 

## 2017-06-20 NOTE — Discharge Instructions (Signed)
You may alternate Tylenol 1000 mg every 6 hours as needed for pain and Ibuprofen 800 mg every 8 hours as needed for pain.  Please take Ibuprofen with food. ° ° ° °To find a primary care or specialty doctor please call [phone] or [phone] to access "[facility] Find a Doctor Service." ° °You may also go on the [facility] website at [email or web address] ° °There are also multiple [facility], [facility], [facility] and [facility] practices throughout the [place] that are frequently accepting new patients. You may find a clinic that is close to your home and contact them. ° °[facility] -  °[address] °[place] [address] °[phone] ° ° °[facility] -  °[address] °[place] [address] [phone] ° ° °[facility] - °[address]  °[place] [address] °[phone] ° ° °

## 2017-07-29 ENCOUNTER — Emergency Department (HOSPITAL_COMMUNITY): Admission: EM | Admit: 2017-07-29 | Discharge: 2017-07-29 | Discharge status: Absent without leave | Payer: Self-pay

## 2017-09-14 ENCOUNTER — Emergency Department (HOSPITAL_COMMUNITY)
Admission: EM | Admit: 2017-09-14 | Discharge: 2017-09-15 | Disposition: A | Discharge status: Home / Self Care | Payer: Self-pay | Attending: Emergency Medicine | Admitting: Emergency Medicine

## 2017-09-14 ENCOUNTER — Encounter (HOSPITAL_COMMUNITY): Payer: Self-pay | Admitting: Emergency Medicine

## 2017-09-14 ENCOUNTER — Other Ambulatory Visit: Payer: Self-pay

## 2017-09-14 DIAGNOSIS — H9201 Otalgia, right ear: Secondary | ICD-10-CM

## 2017-09-14 DIAGNOSIS — R0981 Nasal congestion: Secondary | ICD-10-CM

## 2017-09-14 DIAGNOSIS — J45909 Unspecified asthma, uncomplicated: Secondary | ICD-10-CM | POA: Insufficient documentation

## 2017-09-14 DIAGNOSIS — J029 Acute pharyngitis, unspecified: Secondary | ICD-10-CM | POA: Insufficient documentation

## 2017-09-14 DIAGNOSIS — F17210 Nicotine dependence, cigarettes, uncomplicated: Secondary | ICD-10-CM | POA: Insufficient documentation

## 2017-09-14 NOTE — ED Provider Notes (Signed)
[facility] EMERGENCY DEPARTMENT Provider Note   CSN: [id number] Arrival date & time: 09/14/17  2259     History   Chief Complaint Chief Complaint  Patient presents with  . Otalgia    HPI Faith Pope is a 27 y.o. female.  HPI   Faith Pope is a 27 y.o. female, with a history of asthma, presenting to the ED with nasal congestion, right ear pain, and sore throat for the last 2-3 days.  Pain is aching, moderate, nonradiating.  Has been taking Mucinex.  No fever/chills, cough, shortness of breath, chest pain, N/V/D, abdominal pain, or any other complaints.  Past Medical History:  Diagnosis Date  . Asthma     There are no active problems to display for this patient.   Past Surgical History:  Procedure Laterality Date  . TONSILLECTOMY      OB History    No data available       Home Medications    Prior to Admission medications   Medication Sig Start Date End Date Taking? Authorizing Provider  ibuprofen (ADVIL,MOTRIN) 600 MG tablet Take 1 tablet (600 mg total) by mouth every 6 (six) hours as needed. 09/15/17   Javarus Dorner, Helane Gunther, PA-C    Family History No family history on file.  Social History Social History   Tobacco Use  . Smoking status: Current Every Day Smoker    Packs/day: 1.00    Types: Cigarettes  . Smokeless tobacco: Never Used  Substance Use Topics  . Alcohol use: Yes    Comment: "Not really ever"  . Drug use: No     Allergies   Shellfish allergy; Amoxicillin; and Penicillins   Review of Systems Review of Systems  Constitutional: Negative for chills, diaphoresis and fever.  HENT: Positive for congestion, rhinorrhea and sore throat. Negative for trouble swallowing and voice change.   Respiratory: Negative for cough and shortness of breath.   Cardiovascular: Negative for chest pain.  Gastrointestinal: Negative for abdominal pain, diarrhea, nausea and vomiting.  All other systems reviewed and are negative.    Physical  Exam Updated Vital Signs BP 134/90 (BP Location: Left Arm)   Pulse 86   Temp 99.4 F (37.4 C) (Oral)   Resp 16   Ht 5\' 2"  (1.575 m)   Wt 77.1 kg (170 lb)   SpO2 99%   BMI 31.09 kg/m   Physical Exam  Constitutional: She appears well-developed and well-nourished. No distress.  HENT:  Head: Normocephalic and atraumatic.  Right Ear: External ear and ear canal normal. A middle ear effusion is present.  Left Ear: External ear and ear canal normal.  Mouth/Throat: Oropharynx is clear and moist.  Eyes: Conjunctivae are normal.  Neck: Neck supple.  Cardiovascular: Normal rate, regular rhythm, normal heart sounds and intact distal pulses.  Pulmonary/Chest: Effort normal and breath sounds normal. No respiratory distress.  Patient shows no increased work of breathing.  Speaks in full sentences without difficulty.  Abdominal: Soft. There is no tenderness. There is no guarding.  Musculoskeletal: She exhibits no edema.  Lymphadenopathy:    She has no cervical adenopathy.  Neurological: She is alert.  Skin: Skin is warm and dry. She is not diaphoretic.  Psychiatric: She has a normal mood and affect. Her behavior is normal.  Nursing note and vitals reviewed.    ED Treatments / Results  Labs (all labs ordered are listed, but only abnormal results are displayed) Labs Reviewed - No data to display  EKG  EKG  Interpretation None       Radiology No results found.  Procedures Procedures (including critical care time)  Medications Ordered in ED Medications  ketorolac (TORADOL) injection 60 mg (60 mg Intramuscular Refused 09/15/17 0023)  ibuprofen (ADVIL,MOTRIN) tablet 600 mg (600 mg Oral Given 09/15/17 0033)     Initial Impression / Assessment and Plan / ED Course  I have reviewed the triage vital signs and the nursing notes.  Pertinent labs & imaging results that were available during my care of the patient were reviewed by me and considered in my medical decision making (see  chart for details).     Patient presents with congestion, ear pain, and sore throat.  Patient's symptoms are consistent with viral URI. The patient was given instructions for home care as well as return precautions. Patient voices understanding of these instructions, accepts the plan, and is comfortable with discharge.   Final Clinical Impressions(s) / ED Diagnoses   Final diagnoses:  Nasal congestion  Right ear pain    ED Discharge Orders        Ordered    ibuprofen (ADVIL,MOTRIN) 600 MG tablet  Every 6 hours PRN     09/15/17 0008       Lorayne Bender, PA-C 09/15/17 0151    Ezequiel Essex, MD 09/15/17 0231

## 2017-09-14 NOTE — ED Triage Notes (Signed)
Pt c/o right ear pain, nasal congestion, and sore throat x "a few days". Denies fevers at home.

## 2017-09-15 MED ORDER — KETOROLAC TROMETHAMINE 60 MG/2ML IM SOLN
60.0000 mg | Freq: Once | INTRAMUSCULAR | Status: DC
  Filled 2017-09-15: qty 2

## 2017-09-15 MED ORDER — IBUPROFEN 400 MG PO TABS
600.0000 mg | ORAL_TABLET | Freq: Once | ORAL | Status: AC
  Administered 2017-09-15: 600 mg via ORAL
  Filled 2017-09-15: qty 1

## 2017-09-15 MED ORDER — IBUPROFEN 600 MG PO TABS: 600.0000 mg | ORAL_TABLET | Freq: Four times a day (QID) | ORAL | 0 refills | Status: DC | PRN

## 2017-09-15 NOTE — Discharge Instructions (Signed)
Your symptoms are consistent with a viral illness. Viruses do not require antibiotics. Treatment is symptomatic care and it is important to note that these symptoms may last for 7-14 days.   Hand washing: Wash your hands throughout the day, but especially before and after touching the face, using the restroom, sneezing, coughing, or touching surfaces that have been coughed or sneezed upon. Hydration: Symptoms will be intensified and complicated by dehydration. Dehydration can also extend the duration of symptoms. Drink plenty of fluids and get plenty of rest. You should be drinking at least half a liter of water an hour to stay hydrated. Electrolyte drinks (ex. Gatorade, Powerade, Pedialyte) are also encouraged. You should be drinking enough fluids to make your urine light yellow, almost clear. If this is not the case, you are not drinking enough water. Please note that some of the treatments indicated below will not be effective if you are not adequately hydrated. Pain or fever: Ibuprofen, Naproxen, or Tylenol for pain or fever.  Antiinflammatory medications: Take 600 mg of ibuprofen every 6 hours or 440 mg (over the counter dose) to 500 mg (prescription dose) of naproxen every 12 hours for the next 3 days. After this time, these medications may be used as needed for pain. Take these medications with food to avoid upset stomach. Choose only one of these medications, do not take them together. Tylenol: Should you continue to have additional pain while taking the ibuprofen or naproxen, you may add in tylenol as needed. Your daily total maximum amount of tylenol from all sources should be limited to 4000mg /day for persons without liver problems, or 2000mg /day for those with liver problems. Zyrtec or Claritin: May add these medication daily to control underlying symptoms of congestion, sneezing, and other signs of allergies. Flonase: Use this medication, as directed, for nasal and sinus  congestion. Congestion: Plain Mucinex may help relieve congestion. Saline sinus rinses and saline nasal sprays may also help relieve congestion. If you do not have heart problems or an allergy to such medications, you may also try phenylephrine or Sudafed. Sore throat: Warm liquids or Chloraseptic spray may help soothe a sore throat. Gargle twice a day with a salt water solution made from a half teaspoon of salt in a cup of warm water.  Follow up: Follow up with a primary care provider, as needed, for any future management of this issue.

## 2018-01-31 ENCOUNTER — Emergency Department (HOSPITAL_COMMUNITY)
Admission: EM | Admit: 2018-01-31 | Discharge: 2018-02-01 | Disposition: A | Discharge status: Home / Self Care | Payer: Self-pay | Attending: Emergency Medicine | Admitting: Emergency Medicine

## 2018-01-31 ENCOUNTER — Encounter (HOSPITAL_COMMUNITY): Payer: Self-pay | Admitting: Emergency Medicine

## 2018-01-31 DIAGNOSIS — F17210 Nicotine dependence, cigarettes, uncomplicated: Secondary | ICD-10-CM | POA: Insufficient documentation

## 2018-01-31 DIAGNOSIS — Y999 Unspecified external cause status: Secondary | ICD-10-CM | POA: Insufficient documentation

## 2018-01-31 DIAGNOSIS — J45909 Unspecified asthma, uncomplicated: Secondary | ICD-10-CM | POA: Insufficient documentation

## 2018-01-31 DIAGNOSIS — X509XXA Other and unspecified overexertion or strenuous movements or postures, initial encounter: Secondary | ICD-10-CM | POA: Insufficient documentation

## 2018-01-31 DIAGNOSIS — S161XXA Strain of muscle, fascia and tendon at neck level, initial encounter: Secondary | ICD-10-CM | POA: Insufficient documentation

## 2018-01-31 DIAGNOSIS — Y939 Activity, unspecified: Secondary | ICD-10-CM | POA: Insufficient documentation

## 2018-01-31 DIAGNOSIS — Y929 Unspecified place or not applicable: Secondary | ICD-10-CM | POA: Insufficient documentation

## 2018-01-31 MED ORDER — HYDROCODONE-ACETAMINOPHEN 5-325 MG PO TABS
1.0000 | ORAL_TABLET | Freq: Once | ORAL | Status: AC
  Administered 2018-01-31: 1 via ORAL
  Filled 2018-01-31: qty 1

## 2018-01-31 MED ORDER — IBUPROFEN 800 MG PO TABS: 800.0000 mg | ORAL_TABLET | Freq: Three times a day (TID) | ORAL | 0 refills | Status: DC | PRN

## 2018-01-31 MED ORDER — CYCLOBENZAPRINE HCL 10 MG PO TABS: 10.0000 mg | ORAL_TABLET | Freq: Two times a day (BID) | ORAL | 0 refills | Status: DC | PRN

## 2018-01-31 NOTE — ED Triage Notes (Signed)
Patient c/o right side neck pain radiating to shoulder since bowling last night. Reports pain worsens with movement.

## 2018-01-31 NOTE — Discharge Instructions (Signed)
It was my pleasure taking care of you today!   Ibuprofen as needed for pain.  Flexeril is your muscle relaxer to take as needed. Ice or heat to affected area will help with pain/swelling as well.  Follow up with your primary care doctor or the orthopedist listed if your symptoms are not improving over the next week.   Return to ER for any worsening symptoms, any additional concerns.

## 2018-01-31 NOTE — ED Provider Notes (Signed)
[facility] DEPT Provider Note   CSN: [id number] Arrival date & time: 01/31/18  2058     History   Chief Complaint Chief Complaint  Patient presents with  . Neck Pain    HPI Faith Pope is a 27 y.o. female.  The history is provided by the patient and medical records. No language interpreter was used.  Neck Pain   Pertinent negatives include no numbness and no weakness.   Faith Pope is a right-hand-dominant 27 y.o. female  with no pertinent PMH who presents to the Emergency Department complaining of right-sided neck pain which began this morning.  Patient states that she went bowling yesterday afternoon and then played basketball afterwards.  When she awoke this morning, she had pain and tightness to the right side of her neck and right upper shoulder.  She took an ibuprofen which helped for about 2 hours, but then the pain came back.  She went to work, but could not turn her head without moving her whole body, making her job difficult, therefore she went home.  No history of similar.  Denies any central neck pain.  No numbness, tingling.  No fever or chills.  Past Medical History:  Diagnosis Date  . Asthma     There are no active problems to display for this patient.   Past Surgical History:  Procedure Laterality Date  . TONSILLECTOMY       OB History   None      Home Medications    Prior to Admission medications   Medication Sig Start Date End Date Taking? Authorizing Provider  cyclobenzaprine (FLEXERIL) 10 MG tablet Take 1 tablet (10 mg total) by mouth 2 (two) times daily as needed for muscle spasms. 01/31/18   Kellie Chisolm, Ozella Almond, PA-C  ibuprofen (ADVIL,MOTRIN) 800 MG tablet Take 1 tablet (800 mg total) by mouth every 8 (eight) hours as needed. 01/31/18   Bassel Gaskill, Ozella Almond, PA-C    Family History No family history on file.  Social History Social History   Tobacco Use  . Smoking status: Current Every Day Smoker    Packs/day:  1.00    Types: Cigarettes  . Smokeless tobacco: Never Used  Substance Use Topics  . Alcohol use: Yes    Comment: "Not really ever"  . Drug use: No     Allergies   Shellfish allergy; Amoxicillin; and Penicillins   Review of Systems Review of Systems  Constitutional: Negative for chills and fever.  Musculoskeletal: Positive for myalgias and neck pain.  Neurological: Negative for weakness and numbness.     Physical Exam Updated Vital Signs BP (!) 140/93 (BP Location: Right Arm)   Pulse 85   Temp 98.6 F (37 C) (Oral)   Resp 18   Ht 5\' 2"  (1.575 m)   Wt 86.2 kg (190 lb)   SpO2 100%   BMI 34.75 kg/m   Physical Exam  Constitutional: She appears well-developed and well-nourished. No distress.  HENT:  Head: Normocephalic and atraumatic.  Neck: Neck supple.  Tenderness to palpation of right paraspinal musculature and right trapezius.  No midline tenderness.  No meningeal signs.  Cardiovascular: Normal rate, regular rhythm and normal heart sounds.  No murmur heard. Pulmonary/Chest: Effort normal and breath sounds normal. No respiratory distress. She has no wheezes. She has no rales.  Musculoskeletal: Normal range of motion.  No T/L-spine tenderness.  No bony tenderness of the right shoulder or clavicle.  5/5 muscle strength and full range of motion of  bilateral upper extremities.  Neurological: She is alert.  Bilateral upper extremities neurovascularly intact.  Skin: Skin is warm and dry.  Nursing note and vitals reviewed.    ED Treatments / Results  Labs (all labs ordered are listed, but only abnormal results are displayed) Labs Reviewed - No data to display  EKG None  Radiology No results found.  Procedures Procedures (including critical care time)  Medications Ordered in ED Medications  HYDROcodone-acetaminophen (NORCO/VICODIN) 5-325 MG per tablet 1 tablet (1 tablet Oral Given 01/31/18 2336)     Initial Impression / Assessment and Plan / ED Course  I  have reviewed the triage vital signs and the nursing notes.  Pertinent labs & imaging results that were available during my care of the patient were reviewed by me and considered in my medical decision making (see chart for details).    Faith Pope is a 27 y.o. female who presents to ED for neck pain which is began this morning.  She did go bowling and play basketball yesterday which are not typical activities for her.  On exam, patient with no midline cervical tenderness.  She is afebrile with no meningeal signs or reasons to suggest infection.  She does have tenderness to paraspinal musculature and trapezius musculature consistent with strain.  Will treat symptomatically and have her follow-up with PCP if no improvement.  All questions answered.   Final Clinical Impressions(s) / ED Diagnoses   Final diagnoses:  Strain of neck muscle, initial encounter    ED Discharge Orders        Ordered    ibuprofen (ADVIL,MOTRIN) 800 MG tablet  Every 8 hours PRN     01/31/18 2326    cyclobenzaprine (FLEXERIL) 10 MG tablet  2 times daily PRN     01/31/18 2326       Rahel Carlton, Ozella Almond, PA-C 01/31/18 Friedens, [place], MD 02/01/18 [phone]

## 2018-08-22 ENCOUNTER — Emergency Department (HOSPITAL_COMMUNITY)
Admission: EM | Admit: 2018-08-22 | Discharge: 2018-08-22 | Disposition: A | Discharge status: Home / Self Care | Payer: Self-pay | Attending: Emergency Medicine | Admitting: Emergency Medicine

## 2018-08-22 ENCOUNTER — Other Ambulatory Visit: Payer: Self-pay

## 2018-08-22 ENCOUNTER — Encounter (HOSPITAL_COMMUNITY): Payer: Self-pay | Admitting: Emergency Medicine

## 2018-08-22 DIAGNOSIS — F17210 Nicotine dependence, cigarettes, uncomplicated: Secondary | ICD-10-CM | POA: Insufficient documentation

## 2018-08-22 DIAGNOSIS — J45909 Unspecified asthma, uncomplicated: Secondary | ICD-10-CM | POA: Insufficient documentation

## 2018-08-22 DIAGNOSIS — H9201 Otalgia, right ear: Secondary | ICD-10-CM | POA: Insufficient documentation

## 2018-08-22 DIAGNOSIS — K029 Dental caries, unspecified: Secondary | ICD-10-CM | POA: Insufficient documentation

## 2018-08-22 MED ORDER — FLUTICASONE PROPIONATE 50 MCG/ACT NA SUSP: 1.0000 | Freq: Every day | NASAL | 2 refills | Status: DC

## 2018-08-22 MED ORDER — CLINDAMYCIN HCL 150 MG PO CAPS: 300.0000 mg | ORAL_CAPSULE | Freq: Three times a day (TID) | ORAL | 0 refills | Status: DC

## 2018-08-22 NOTE — ED Triage Notes (Signed)
Pt c/o R ear pain x 1 week. Is taking ibuprofen for pain.

## 2018-08-22 NOTE — ED Provider Notes (Signed)
[name] DEPT Provider Note   CSN: [id number] Arrival date & time: 08/22/18  1159     History   Chief Complaint Chief Complaint  Patient presents with  . Otalgia    HPI Faith Pope is a 27 y.o. female presents for evaluation of 1 week of right ear pain.  She reports that at onset of symptoms, she had cold symptoms, including rhinorrhea, nasal congestion and cough.  She reports she still has cough but states it is nonproductive.  Patient states she has not had any fever.  Patient states she is continued to have right ear pain.  She has been taking ibuprofen with minimal improvement.  Patient denies any sore throat.  Patient also reports she has some pain to the right lower teeth where she thinks her wisdom tooth is coming in.  Patient has not had any difficulty swallowing, hearing loss.  The history is provided by the patient.    Past Medical History:  Diagnosis Date  . Asthma     There are no active problems to display for this patient.   Past Surgical History:  Procedure Laterality Date  . TONSILLECTOMY       OB History   No obstetric history on file.      Home Medications    Prior to Admission medications   Medication Sig Start Date End Date Taking? Authorizing Provider  clindamycin (CLEOCIN) 150 MG capsule Take 2 capsules (300 mg total) by mouth 3 (three) times daily. 08/22/18   Volanda Napoleon, PA-C  cyclobenzaprine (FLEXERIL) 10 MG tablet Take 1 tablet (10 mg total) by mouth 2 (two) times daily as needed for muscle spasms. 01/31/18   Ward, Ozella Almond, PA-C  fluticasone (FLONASE) 50 MCG/ACT nasal spray Place 1 spray into both nostrils daily. 08/22/18   Volanda Napoleon, PA-C  ibuprofen (ADVIL,MOTRIN) 800 MG tablet Take 1 tablet (800 mg total) by mouth every 8 (eight) hours as needed. 01/31/18   Ward, Ozella Almond, PA-C    Family History No family history on file.  Social History Social History   Tobacco Use  . Smoking  status: Current Every Day Smoker    Packs/day: 1.00    Types: Cigarettes  . Smokeless tobacco: Never Used  Substance Use Topics  . Alcohol use: Yes    Comment: "Not really ever"  . Drug use: No     Allergies   Shellfish allergy; Amoxicillin; and Penicillins   Review of Systems Review of Systems  Constitutional: Negative for fever.  HENT: Positive for congestion, dental problem, ear pain and rhinorrhea. Negative for facial swelling and trouble swallowing.   Respiratory: Positive for cough.   All other systems reviewed and are negative.    Physical Exam Updated Vital Signs BP (!) 128/91 (BP Location: Right Arm)   Pulse 71   Temp 98.9 F (37.2 C) (Oral)   Resp 16   LMP 07/25/2018   SpO2 99%   Physical Exam Vitals signs and nursing note reviewed.  Constitutional:      Appearance: She is well-developed.  HENT:     Head: Normocephalic and atraumatic.     Right Ear: No mastoid tenderness. Tympanic membrane is not injected, erythematous or bulging.     Left Ear: Tympanic membrane normal. No mastoid tenderness. Tympanic membrane is not injected, erythematous or bulging.     Ears:     Comments: Bilateral TMs are without any erythema, bulging, injection.  No tenderness, erythema, warmth noted to mastoid process  bilaterally.    Mouth/Throat:     Mouth: Mucous membranes are moist.     Dentition: Abnormal dentition.     Pharynx: Oropharynx is clear.      Comments: Dental caries noted throughout.  Airways patent, phonation is intact. Eyes:     General: No scleral icterus.       Right eye: No discharge.        Left eye: No discharge.     Conjunctiva/sclera: Conjunctivae normal.  Pulmonary:     Effort: Pulmonary effort is normal.  Skin:    General: Skin is warm and dry.  Neurological:     Mental Status: She is alert.  Psychiatric:        Speech: Speech normal.        Behavior: Behavior normal.      ED Treatments / Results  Labs (all labs ordered are listed, but  only abnormal results are displayed) Labs Reviewed - No data to display  EKG None  Radiology No results found.  Procedures Procedures (including critical care time)  Medications Ordered in ED Medications - No data to display   Initial Impression / Assessment and Plan / ED Course  I have reviewed the triage vital signs and the nursing notes.  Pertinent labs & imaging results that were available during my care of the patient were reviewed by me and considered in my medical decision making (see chart for details).     27 year old female who presents for evaluation of right ear pain x1 week.  States she does not have any fevers or hearing loss.  Reports that initially at onset of symptoms, she had some URI symptoms, including nasal congestion, rhinorrhea and cough.  Reports cough has lingered.  Patient also reports that she has had pain to her right teeth and does not know if that has been contributing to the right ear pain.  Patient is afebrile, non-toxic appearing, sitting comfortably on examination table. Vital signs reviewed and stable.  On exam, bilateral TMs are without any erythema, bulging, injection.  She does have some dental caries as well as a partially cracked tooth noted on the right side.  Question of dental pain is radiating up into the ear pain.  History/physical exam not concerning for acute otitis media, otitis externa, mastoiditis.  History/physical exam not concerning for Ludwig angina or peritonsillar abscess.  I do not see any obvious identifiable dental abscess that would require incision and drainage here in the ED. additionally, history/physical exam not concerning for temporal arteritis, shingles.  We will plan to start patient on antibiotics for possible dental cause of pain.  Patient allergic to amoxicillin and penicillin.  She has tolerated clindamycin in the past without any difficulty.  Additionally, we will give her outpatient dental follow-up. Patient had ample  opportunity for questions and discussion. All patient's questions were answered with full understanding. Strict return precautions discussed. Patient expresses understanding and agreement to plan.    Portions of this note were generated with [employer]. Dictation errors may occur despite best attempts at proofreading.     Final Clinical Impressions(s) / ED Diagnoses   Final diagnoses:  Right ear pain  Dental caries    ED Discharge Orders         Ordered    fluticasone (FLONASE) 50 MCG/ACT nasal spray  Daily     08/22/18 1503    clindamycin (CLEOCIN) 150 MG capsule  3 times daily     08/22/18 1503  Volanda Napoleon, PA-C 08/22/18 1741    Lennice Sites, DO 08/22/18 1858

## 2018-08-22 NOTE — Discharge Instructions (Signed)
As we discussed, your ear pain could be from congestion from your viral URI.  He can use the Flonase to help with congestion.  Additionally, could be from the dental cavities and inflammation surrounding the wisdom tooth.  At this time, I do not see any obvious abscess.  He can try taking the antibiotics to see if that will help with pain.  Additionally, as we discussed, you will need to follow-up with your referred dentist for further evaluation.  You can take Tylenol or Ibuprofen as directed for pain. You can alternate Tylenol and Ibuprofen every 4 hours. If you take Tylenol at 1pm, then you can take Ibuprofen at 5pm. Then you can take Tylenol again at 9pm.   The exam and treatment you received today has been provided on an emergency basis only. This is not a substitute for complete medical or dental care. If your problem worsens or new symptoms (problems) appear, and you are unable to arrange prompt follow-up care with your dentist, call or return to this location. If you do not have a dentist, please follow-up with one on the list provided  CALL YOUR DENTIST OR RETURN IMMEDIATELY IF you develop a fever, rash, difficulty breathing or swallowing, neck or facial swelling, or other potentially serious concerns.

## 2018-12-20 ENCOUNTER — Other Ambulatory Visit: Payer: Self-pay

## 2018-12-20 ENCOUNTER — Encounter (HOSPITAL_COMMUNITY): Payer: Self-pay

## 2018-12-20 ENCOUNTER — Emergency Department (HOSPITAL_COMMUNITY)
Admission: EM | Admit: 2018-12-20 | Discharge: 2018-12-20 | Disposition: A | Discharge status: Home / Self Care | Payer: Self-pay | Attending: Emergency Medicine | Admitting: Emergency Medicine

## 2018-12-20 ENCOUNTER — Emergency Department (HOSPITAL_COMMUNITY): Payer: Self-pay

## 2018-12-20 DIAGNOSIS — R05 Cough: Secondary | ICD-10-CM

## 2018-12-20 DIAGNOSIS — Z79899 Other long term (current) drug therapy: Secondary | ICD-10-CM | POA: Insufficient documentation

## 2018-12-20 DIAGNOSIS — Z7189 Other specified counseling: Secondary | ICD-10-CM | POA: Insufficient documentation

## 2018-12-20 DIAGNOSIS — Z720 Tobacco use: Secondary | ICD-10-CM

## 2018-12-20 DIAGNOSIS — J069 Acute upper respiratory infection, unspecified: Secondary | ICD-10-CM | POA: Insufficient documentation

## 2018-12-20 DIAGNOSIS — R059 Cough, unspecified: Secondary | ICD-10-CM

## 2018-12-20 DIAGNOSIS — F17210 Nicotine dependence, cigarettes, uncomplicated: Secondary | ICD-10-CM | POA: Insufficient documentation

## 2018-12-20 DIAGNOSIS — B9789 Other viral agents as the cause of diseases classified elsewhere: Secondary | ICD-10-CM

## 2018-12-20 DIAGNOSIS — J45909 Unspecified asthma, uncomplicated: Secondary | ICD-10-CM | POA: Insufficient documentation

## 2018-12-20 MED ORDER — ALBUTEROL SULFATE HFA 108 (90 BASE) MCG/ACT IN AERS
2.0000 | INHALATION_SPRAY | Freq: Once | RESPIRATORY_TRACT | Status: AC
  Administered 2018-12-20: 2 via RESPIRATORY_TRACT
  Filled 2018-12-20: qty 6.7

## 2018-12-20 NOTE — Discharge Instructions (Addendum)
There is no way to know right now whether you have the novel coronavirus because we're not currently testing patients unless indicated. We ask that you self quarantine for at least 7 days after symptoms started and at least 3 days fever free without medications, whichever is longer. Continue to stay well-hydrated. Gargle warm salt water and spit it out and use chloraseptic spray as needed for sore throat. Continue to alternate between Tylenol and Ibuprofen for pain or fever. Use Mucinex/Robitussin/etc for cough suppression/expectoration of mucus. Use over the counter flonase and the netipot to help with nasal congestion. May consider over-the-counter Benadryl or other antihistamine like Claritin/Zyrtec/etc to decrease secretions and for help with your symptoms. Use inhaler as directed, as needed for cough/chest congestion/wheezing/shortness of breath. STOP SMOKING! Follow up with your primary care doctor in 5-7 days for recheck of ongoing symptoms. Return to emergency department for emergent changing or worsening of symptoms.

## 2018-12-20 NOTE — ED Provider Notes (Addendum)
[facility] DEPT Provider Note   CSN: [id number] Arrival date & time: 12/20/18  1523    History   Chief Complaint Chief Complaint  Patient presents with  . Sore Throat    HPI    Faith Pope is a 28 y.o. female with a PMHx of asthma, who presents to the ED with complaints of cough with occasional yellow sputum production, lump in neck, and vocal hoarseness for the last 3 days.  She has been using Delsym without relief although Tylenol has provided some relief.  No known aggravating factors.  She tried taking some leftover antibiotics from her dentist, she is not sure of the name of the antibiotic.  She works at [employer] so she is unsure whether she has been around anybody that sick, no known exposure to COVID-19 that she is aware of.  She admits to being a cigarette smoker.  Of note, she is out of an inhaler and needs one today.  She denies any sore throat, ear pain or drainage, loss of taste or smell, fevers, chills, chest pain, shortness of breath, abdominal pain, nausea, vomiting, diarrhea, constipation, dysuria, hematuria, myalgias, arthralgias, numbness, tingling, focal weakness, or any other complaints at this time.  The history is provided by the patient and medical records. No language interpreter was used.  Sore Throat  Pertinent negatives include no chest pain, no abdominal pain and no shortness of breath.    Past Medical History:  Diagnosis Date  . Asthma     There are no active problems to display for this patient.   Past Surgical History:  Procedure Laterality Date  . TONSILLECTOMY       OB History   No obstetric history on file.      Home Medications    Prior to Admission medications   Medication Sig Start Date End Date Taking? Authorizing Provider  clindamycin (CLEOCIN) 150 MG capsule Take 2 capsules (300 mg total) by mouth 3 (three) times daily. 08/22/18   Volanda Napoleon, PA-C  cyclobenzaprine (FLEXERIL) 10 MG tablet  Take 1 tablet (10 mg total) by mouth 2 (two) times daily as needed for muscle spasms. 01/31/18   Ward, Ozella Almond, PA-C  fluticasone (FLONASE) 50 MCG/ACT nasal spray Place 1 spray into both nostrils daily. 08/22/18   Volanda Napoleon, PA-C  ibuprofen (ADVIL,MOTRIN) 800 MG tablet Take 1 tablet (800 mg total) by mouth every 8 (eight) hours as needed. 01/31/18   Ward, Ozella Almond, PA-C    Family History History reviewed. No pertinent family history.  Social History Social History   Tobacco Use  . Smoking status: Current Every Day Smoker    Packs/day: 1.00    Types: Cigarettes  . Smokeless tobacco: Never Used  Substance Use Topics  . Alcohol use: Yes    Comment: "Not really ever"  . Drug use: No     Allergies   Shellfish allergy; Amoxicillin; and Penicillins   Review of Systems Review of Systems  Constitutional: Negative for chills and fever.  HENT: Positive for voice change. Negative for ear discharge, ear pain and sore throat.        No loss of taste/smell  Respiratory: Positive for cough. Negative for shortness of breath.   Cardiovascular: Negative for chest pain.  Gastrointestinal: Negative for abdominal pain, constipation, diarrhea, nausea and vomiting.  Genitourinary: Negative for dysuria and hematuria.  Musculoskeletal: Negative for arthralgias and myalgias.  Skin: Negative for color change.  Allergic/Immunologic: Negative for immunocompromised state.  Neurological: Negative for weakness and numbness.  Hematological: Positive for adenopathy.  Psychiatric/Behavioral: Negative for confusion.   All other systems reviewed and are negative for acute change except as noted in the HPI.    Physical Exam Updated Vital Signs BP 127/87 (BP Location: Left Arm)   Pulse 97   Temp 98.7 F (37.1 C) (Oral)   Resp 18   Ht 5\' 2"  (1.575 m)   Wt 83.9 kg   SpO2 98%   BMI 33.84 kg/m   Physical Exam Vitals signs and nursing note reviewed.  Constitutional:      General: She  is not in acute distress.    Appearance: Normal appearance. She is well-developed. She is not toxic-appearing.     Comments: Afebrile, nontoxic, NAD  HENT:     Head: Normocephalic and atraumatic.     Nose: Nose normal.     Mouth/Throat:     Mouth: Mucous membranes are moist.     Pharynx: Oropharynx is clear. Uvula midline. Posterior oropharyngeal erythema present. No pharyngeal swelling, oropharyngeal exudate or uvula swelling.     Tonsils: No tonsillar exudate or tonsillar abscesses.     Comments: Nose clear. Somewhat laryngitic voice. Oropharynx mildly erythematous, without uvular swelling or deviation, no trismus or drooling, no tonsillar swelling or exudates.  No PTA.  Eyes:     General:        Right eye: No discharge.        Left eye: No discharge.     Conjunctiva/sclera: Conjunctivae normal.  Neck:     Musculoskeletal: Normal range of motion and neck supple.     Thyroid: No thyroid mass, thyromegaly or thyroid tenderness.     Comments: Small mobile nontender superficial cervical lymph node on the R side, does not appear to be attached or near the thyroid, thyroid nontender and freely mobile, no masses noted. No thyromegaly.  Cardiovascular:     Rate and Rhythm: Normal rate and regular rhythm.     Pulses: Normal pulses.     Heart sounds: Normal heart sounds, S1 normal and S2 normal. No murmur. No friction rub. No gallop.   Pulmonary:     Effort: Pulmonary effort is normal. No respiratory distress.     Breath sounds: Normal breath sounds. No decreased breath sounds, wheezing, rhonchi or rales.     Comments: CTAB in all lung fields, no w/r/r, no hypoxia or increased WOB, speaking in full sentences, SpO2 98% on RA  Abdominal:     General: Bowel sounds are normal. There is no distension.     Palpations: Abdomen is soft. Abdomen is not rigid.     Tenderness: There is no abdominal tenderness. There is no right CVA tenderness, left CVA tenderness, guarding or rebound. Negative signs  include Murphy's sign and McBurney's sign.  Musculoskeletal: Normal range of motion.  Lymphadenopathy:     Cervical: Cervical adenopathy present.     Right cervical: Superficial cervical adenopathy present.  Skin:    General: Skin is warm and dry.     Findings: No rash.  Neurological:     Mental Status: She is alert and oriented to person, place, and time.     Sensory: Sensation is intact. No sensory deficit.     Motor: Motor function is intact.  Psychiatric:        Mood and Affect: Mood and affect normal.        Behavior: Behavior normal.      ED Treatments / Results  Labs (all  labs ordered are listed, but only abnormal results are displayed) Labs Reviewed - No data to display  EKG None  Radiology Dg Chest [facility] 1 View  Result Date: 12/20/2018 CLINICAL DATA:  Sore throat with productive cough for 3 days. EXAM: PORTABLE CHEST 1 VIEW COMPARISON:  Radiographs 02/11/2017. FINDINGS: 1602 hours. Lordotic positioning and mild patient rotation to the right. The heart size and mediastinal contours are normal. The lungs are clear. There is no pleural effusion or pneumothorax. No acute osseous findings are identified. IMPRESSION: Stable chest allowing for positional differences. No active cardiopulmonary process. Electronically Signed   By: Richardean Sale M.D.   On: 12/20/2018 16:58    Procedures Procedures (including critical care time)  Medications Ordered in ED Medications  albuterol (VENTOLIN HFA) 108 (90 Base) MCG/ACT inhaler 2 puff (2 puffs Inhalation Given 12/20/18 1635)     Initial Impression / Assessment and Plan / ED Course  I have reviewed the triage vital signs and the nursing notes.  Pertinent labs & imaging results that were available during my care of the patient were reviewed by me and considered in my medical decision making (see chart for details).        28 y.o. female here with cough and voice hoarseness for the last 3 days.  Also feels a lump in her neck.   On exam, has a small nontender lymph node in the anterior cervical chain, does not feel to be related to the thyroid, thyroid nontender and without any lumps or masses, mobile.  Lung exam clear.  Throat mildly erythematous but otherwise clear.  Nose clear.  Doubt need for COVID-19 testing at this time but will get CXR and reassess. Pt doesn't have inhaler, will give her one from here, but no wheezing on exam.   Faith Pope was evaluated in Emergency Department on 12/20/2018 for the symptoms described in the history of present illness. She was evaluated in the context of the global COVID-19 pandemic, which necessitated consideration that the patient might be at risk for infection with the SARS-CoV-2 virus that causes COVID-19. Institutional protocols and algorithms that pertain to the evaluation of patients at risk for COVID-19 are in a state of rapid change based on information released by regulatory bodies including the CDC and federal and state organizations. These policies and algorithms were followed during the patient's care in the ED.  5:21 PM CXR negative. Likely viral vs allergic illness. Advised could be COVID19, doubt need for testing, but advised self quarantine guidelines. Advised OTC remedies for symptomatic relief. F/up with PCP in 1wk for recheck. Smoking cessation strongly encouraged. I explained the diagnosis and have given explicit precautions to return to the ER including for any other new or worsening symptoms. The patient understands and accepts the medical plan as it's been dictated and I have answered their questions. Discharge instructions concerning home care and prescriptions have been given. The patient is STABLE and is discharged to home in good condition.    Final Clinical Impressions(s) / ED Diagnoses   Final diagnoses:  Cough  Viral URI with cough  Tobacco user  Educated About [place] Infection    ED Discharge Orders    [address] [place], [place]  12/20/18 1723    Hayden Rasmussen, MD 12/21/18 1147

## 2018-12-20 NOTE — ED Triage Notes (Signed)
States for about 3 days sore throat and knot on right side of neck with productive cough with yellowish secretions.  With hoarseness of voiced noted no drooling noted no fever per pt.

## 2018-12-20 NOTE — ED Notes (Signed)
Bed: WTR8 Expected date:  Expected time:  Means of arrival:  Comments: 

## 2018-12-23 ENCOUNTER — Emergency Department (HOSPITAL_COMMUNITY)
Admission: EM | Admit: 2018-12-23 | Discharge: 2018-12-23 | Disposition: A | Discharge status: Home / Self Care | Payer: Medicaid Other | Attending: Emergency Medicine | Admitting: Emergency Medicine

## 2018-12-23 ENCOUNTER — Encounter (HOSPITAL_COMMUNITY): Payer: Self-pay | Admitting: Family Medicine

## 2018-12-23 ENCOUNTER — Other Ambulatory Visit: Payer: Self-pay

## 2018-12-23 DIAGNOSIS — F17210 Nicotine dependence, cigarettes, uncomplicated: Secondary | ICD-10-CM | POA: Insufficient documentation

## 2018-12-23 DIAGNOSIS — R05 Cough: Secondary | ICD-10-CM | POA: Insufficient documentation

## 2018-12-23 DIAGNOSIS — J45909 Unspecified asthma, uncomplicated: Secondary | ICD-10-CM | POA: Insufficient documentation

## 2018-12-23 DIAGNOSIS — Y939 Activity, unspecified: Secondary | ICD-10-CM | POA: Insufficient documentation

## 2018-12-23 DIAGNOSIS — B9789 Other viral agents as the cause of diseases classified elsewhere: Secondary | ICD-10-CM

## 2018-12-23 DIAGNOSIS — B309 Viral conjunctivitis, unspecified: Secondary | ICD-10-CM

## 2018-12-23 DIAGNOSIS — J069 Acute upper respiratory infection, unspecified: Secondary | ICD-10-CM

## 2018-12-23 DIAGNOSIS — Y929 Unspecified place or not applicable: Secondary | ICD-10-CM | POA: Insufficient documentation

## 2018-12-23 DIAGNOSIS — S0502XA Injury of conjunctiva and corneal abrasion without foreign body, left eye, initial encounter: Secondary | ICD-10-CM

## 2018-12-23 DIAGNOSIS — X58XXXA Exposure to other specified factors, initial encounter: Secondary | ICD-10-CM | POA: Insufficient documentation

## 2018-12-23 DIAGNOSIS — Y999 Unspecified external cause status: Secondary | ICD-10-CM | POA: Insufficient documentation

## 2018-12-23 MED ORDER — FLUORESCEIN SODIUM 1 MG OP STRP
1.0000 | ORAL_STRIP | Freq: Once | OPHTHALMIC | Status: DC
  Filled 2018-12-23: qty 1

## 2018-12-23 MED ORDER — TETRACAINE HCL 0.5 % OP SOLN
2.0000 [drp] | Freq: Once | OPHTHALMIC | Status: DC
  Filled 2018-12-23: qty 4

## 2018-12-23 MED ORDER — POLYMYXIN B-TRIMETHOPRIM 10000-0.1 UNIT/ML-% OP SOLN: 1.0000 [drp] | Freq: Four times a day (QID) | OPHTHALMIC | 0 refills | Status: DC

## 2018-12-23 NOTE — Discharge Instructions (Addendum)
You may alternate Tylenol 1000 mg every 6 hours as needed for pain and Ibuprofen 800 mg every 8 hours as needed for pain.  Please take Ibuprofen with food.  I recommend that you continue to self quarantine until you have been without symptoms for at least 3 full days.

## 2018-12-23 NOTE — ED Provider Notes (Addendum)
TIME SEEN: 3:17 AM  CHIEF COMPLAINT: cough, sore throat, congestion now with left eye pain  HPI: Patient is a 28 year old female with history of asthma who presents to the emergency department with dry cough, sore throat, nasal congestion for the past several days.  No fevers or chills.  Was seen in the emergency department on April 26 and had a negative chest x-ray.  States she works at [employer] but has no known COVID-19 positive contacts.  Was instructed to self isolate at home which she has been doing.  She states that her left eye became red and uncomfortable today and she had purulent discharge.  States she thought it was pinkeye but then when it became more painful tonight, she decided to come to the emergency department.  No injury to the eye.  Does not wear contacts or glasses.  No recent eye surgery.  Denies vision loss, blurry vision, flashers, floaters.  No headache or vomiting.  Right eye is also slightly red but she denies any pain to the right eye.  ROS: See HPI Constitutional: no fever  Eyes:  drainage  ENT:  runny nose   Cardiovascular:  no chest pain  Resp: no SOB  GI: no vomiting GU: no dysuria Integumentary: no rash  Allergy: no hives  Musculoskeletal: no leg swelling  Neurological: no slurred speech ROS otherwise negative  PAST MEDICAL HISTORY/PAST SURGICAL HISTORY:  Past Medical History:  Diagnosis Date  . Asthma     MEDICATIONS:  Prior to Admission medications   Medication Sig Start Date End Date Taking? Authorizing Provider  clindamycin (CLEOCIN) 150 MG capsule Take 2 capsules (300 mg total) by mouth 3 (three) times daily. 08/22/18   Volanda Napoleon, PA-C  cyclobenzaprine (FLEXERIL) 10 MG tablet Take 1 tablet (10 mg total) by mouth 2 (two) times daily as needed for muscle spasms. 01/31/18   Ward, Ozella Almond, PA-C  fluticasone (FLONASE) 50 MCG/ACT nasal spray Place 1 spray into both nostrils daily. 08/22/18   Volanda Napoleon, PA-C  ibuprofen (ADVIL,MOTRIN)  800 MG tablet Take 1 tablet (800 mg total) by mouth every 8 (eight) hours as needed. 01/31/18   Ward, Ozella Almond, PA-C    ALLERGIES:  Allergies  Allergen Reactions  . Shellfish Allergy Other (See Comments)    Mouth burns and tongue numb  . Amoxicillin Rash  . Penicillins Rash    Has patient had a PCN reaction causing immediate rash, facial/tongue/throat swelling, SOB or lightheadedness with hypotension: rash Has patient had a PCN reaction causing severe rash involving mucus membranes or skin necrosis: No Has patient had a PCN reaction that required hospitalization: No Has patient had a PCN reaction occurring within the last 10 years: No If all of the above answers are "NO", then may proceed with Cephalosporin use.    SOCIAL HISTORY:  Social History   Tobacco Use  . Smoking status: Current Every Day Smoker    Packs/day: 1.00    Types: Cigars  . Smokeless tobacco: Never Used  Substance Use Topics  . Alcohol use: Yes    Comment: "Not really ever"    FAMILY HISTORY: History reviewed. No pertinent family history.  EXAM: BP (!) 130/106 (BP Location: Left Arm)   Pulse 94   Temp 98.7 F (37.1 C) (Oral)   Resp 18   Ht 5\' 2"  (1.575 m)   Wt 83.9 kg   LMP 12/01/2018   SpO2 99%   BMI 33.84 kg/m  CONSTITUTIONAL: Alert and oriented and responds  appropriately to questions. Well-appearing; well-nourished, afebrile, nontoxic, well-hydrated HEAD: Normocephalic EYES: Conjunctivae injected bilaterally but worse in the left eye than the right without drainage, pupils equal and reactive bilaterally, no pain with consensual light response, patient has a corneal abrasion to the left eye without ulceration or foreign body pain by fluorescein uptake, intraocular pressure in the left eye is 22 mmHg, funduscopic exam deferred, no hypopyon or hyphema, see nursing notes for visual acuity ENT: normal nose; moist mucous membranes; No pharyngeal erythema or petechiae, no tonsillar hypertrophy or  exudate, no uvular deviation, no unilateral swelling, no trismus or drooling, no muffled voice, patient has a slightly hoarse voice, no stridor, no dental caries present, no drainable dental abscess noted, no Ludwig's angina, tongue sits flat in the bottom of the mouth, no angioedema, no facial erythema or warmth, no facial swelling; no pain with movement of the neck. NECK: Supple, no meningismus, no nuchal rigidity, patient has some anterior cervical lymphadenopathy CARD: Regular rate and rhythm RESP: Normal chest excursion without splinting or tachypnea; no audible wheezing, no hypoxia or respiratory distress, speaking full sentences ABD/GI: Nondistended BACK:  The back appears normal range of motion EXT: Normal ROM in all joints; no major deformities noted SKIN: Normal color for age and race; warm; no rash on exposed skin NEURO: Moves all extremities equally PSYCH: The patient's mood and manner are appropriate. Grooming and personal hygiene are appropriate.  MEDICAL DECISION MAKING: Patient here with a viral illness and conjunctivitis.  Suspect that this is viral conjunctivitis but given she has a corneal abrasion and she reports improvement discharges morning, will discharge with Polytrim drops for possible bacterial component and have her follow-up with ophthalmology.  She does not wear contacts.  No foreign body or ulceration appreciated.  No vision changes.  She is otherwise well-appearing.  No hypoxia or respiratory distress.  Have advised her to continue to self quarantine and treat this as if she does have COVID-19.  She does not meet criteria for testing from the emergency department and does not need admission to the hospital.  Does not appear septic or toxic today.  She is comfortable with this plan.  Discussed return precautions.      Ward, Delice Bison, DO 12/23/18 [place], Delice Bison, DO 12/23/18 [id number]

## 2018-12-23 NOTE — ED Notes (Signed)
Visual Acuity Assessment:      20/40 Left and 20/20 Right eye @ 20 ft

## 2018-12-23 NOTE — ED Triage Notes (Signed)
Patient states she is experiencing left eye that started yesterday. Patient reports she has clear drainage. Denies itching. Patients left eye appears irritated. Denies placing in eye drops or taking medication for symptoms.

## 2019-01-22 ENCOUNTER — Emergency Department (HOSPITAL_COMMUNITY)
Admission: EM | Admit: 2019-01-22 | Discharge: 2019-01-22 | Disposition: A | Discharge status: Home / Self Care | Payer: Self-pay | Attending: Emergency Medicine | Admitting: Emergency Medicine

## 2019-01-22 ENCOUNTER — Encounter (HOSPITAL_COMMUNITY): Payer: Self-pay | Admitting: Emergency Medicine

## 2019-01-22 ENCOUNTER — Emergency Department (HOSPITAL_COMMUNITY): Payer: Self-pay

## 2019-01-22 ENCOUNTER — Other Ambulatory Visit: Payer: Self-pay

## 2019-01-22 DIAGNOSIS — R591 Generalized enlarged lymph nodes: Secondary | ICD-10-CM | POA: Insufficient documentation

## 2019-01-22 DIAGNOSIS — J029 Acute pharyngitis, unspecified: Secondary | ICD-10-CM | POA: Insufficient documentation

## 2019-01-22 DIAGNOSIS — Z79899 Other long term (current) drug therapy: Secondary | ICD-10-CM | POA: Insufficient documentation

## 2019-01-22 DIAGNOSIS — J45909 Unspecified asthma, uncomplicated: Secondary | ICD-10-CM | POA: Insufficient documentation

## 2019-01-22 DIAGNOSIS — F17290 Nicotine dependence, other tobacco product, uncomplicated: Secondary | ICD-10-CM | POA: Insufficient documentation

## 2019-01-22 DIAGNOSIS — H9201 Otalgia, right ear: Secondary | ICD-10-CM | POA: Insufficient documentation

## 2019-01-22 DIAGNOSIS — R05 Cough: Secondary | ICD-10-CM | POA: Insufficient documentation

## 2019-01-22 LAB — CBC WITH DIFFERENTIAL/PLATELET
Abs Immature Granulocytes: 0.03 10*3/uL (ref 0.00–0.07)
Basophils Absolute: 0 10*3/uL (ref 0.0–0.1)
Basophils Relative: 0 %
Eosinophils Absolute: 0.5 10*3/uL (ref 0.0–0.5)
Eosinophils Relative: 5 %
HCT: 38.8 % (ref 36.0–46.0)
Hemoglobin: 12.2 g/dL (ref 12.0–15.0)
Immature Granulocytes: 0 %
Lymphocytes Relative: 33 %
Lymphs Abs: 2.9 10*3/uL (ref 0.7–4.0)
MCH: 28.4 pg (ref 26.0–34.0)
MCHC: 31.4 g/dL (ref 30.0–36.0)
MCV: 90.4 fL (ref 80.0–100.0)
Monocytes Absolute: 0.9 10*3/uL (ref 0.1–1.0)
Monocytes Relative: 10 %
Neutro Abs: 4.6 10*3/uL (ref 1.7–7.7)
Neutrophils Relative %: 52 %
Platelets: 390 10*3/uL (ref 150–400)
RBC: 4.29 MIL/uL (ref 3.87–5.11)
RDW: 13.3 % (ref 11.5–15.5)
WBC: 8.8 10*3/uL (ref 4.0–10.5)
nRBC: 0 % (ref 0.0–0.2)

## 2019-01-22 LAB — BASIC METABOLIC PANEL
Anion gap: 9 (ref 5–15)
BUN: 20 mg/dL (ref 6–20)
CO2: 22 mmol/L (ref 22–32)
Calcium: 9.3 mg/dL (ref 8.9–10.3)
Chloride: 108 mmol/L (ref 98–111)
Creatinine, Ser: 0.67 mg/dL (ref 0.44–1.00)
GFR calc Af Amer: >60 mL/min (ref >60)
GFR calc non Af Amer: >60 mL/min (ref >60)
Glucose, Bld: 89 mg/dL (ref 70–99)
Potassium: 3.5 mmol/L (ref 3.5–5.1)
Sodium: 139 mmol/L (ref 135–145)

## 2019-01-22 LAB — I-STAT BETA HCG BLOOD, ED (MC, WL, AP ONLY): I-stat hCG, quantitative: <5 m[IU]/mL (ref <5)

## 2019-01-22 LAB — GROUP A STREP BY PCR: Group A Strep by PCR: NOT DETECTED

## 2019-01-22 LAB — MONONUCLEOSIS SCREEN: Mono Screen: NEGATIVE

## 2019-01-22 MED ORDER — SODIUM CHLORIDE (PF) 0.9 % IJ SOLN
INTRAMUSCULAR | Status: AC
  Administered 2019-01-22: 23:00:00
  Filled 2019-01-22: qty 50

## 2019-01-22 MED ORDER — CLINDAMYCIN HCL 300 MG PO CAPS
300.0000 mg | ORAL_CAPSULE | Freq: Once | ORAL | Status: AC
  Administered 2019-01-22: 300 mg via ORAL
  Filled 2019-01-22: qty 1

## 2019-01-22 MED ORDER — CLINDAMYCIN HCL 300 MG PO CAPS: 300.0000 mg | ORAL_CAPSULE | Freq: Three times a day (TID) | ORAL | 0 refills | Status: AC

## 2019-01-22 MED ORDER — IOHEXOL 300 MG/ML  SOLN
100.0000 mL | Freq: Once | INTRAMUSCULAR | Status: AC | PRN
  Administered 2019-01-22: 75 mL via INTRAVENOUS

## 2019-01-22 NOTE — Discharge Instructions (Signed)
Evaluated today for sore throat and neck pain. Take the antibiotics as prescribed. You need to follow up with ENT in 1 week. I have listed their contact information on your discharge paper work. Call Monday for an appointment.  Return to the ED with any new or worsening symptoms.

## 2019-01-22 NOTE — ED Triage Notes (Signed)
Pt report neck pain and sore throat with neck tenderness with palpation

## 2019-01-22 NOTE — ED Provider Notes (Signed)
[facility] DEPT Provider Note   CSN: [id number] Arrival date & time: 01/22/19  2029  History   Chief Complaint Chief Complaint  Patient presents with   Neck Pain   Sore Throat    HPI [facility] is a 28 y.o. female with past medical history significant for asthma who presents for evaluation of sore throat and neck pain.  Patient states she has had an intermittent sore throat x1 month.  States over the last 3 days she has developed right-sided neck pain.  Patient states her mom is a Marine scientist and told her her lymph nodes were swollen.  Patient states she was seen in the ED approximately 1 month ago for sore throat and she was told this was a viral infection.  She has been able to tolerate p.o. intake at home, however states it is still painful to swallow solid foods.  She denies fever, chills, nausea, vomiting, neck stiffness, voice changes, congestion, rhinorrhea, cough, chest pain, shortness of breath, abdominal pain, diarrhea dysuria.  Patient states she does have some mild right ear pain which she states she has had for 1 month.  She denies any tinnitus, discharge or bleeding.  She has not taken anything for symptoms.  Patient states she was told she need to follow-up with PCP previously however states she does not have a PCP and has not followed up.  She rates her current pain a 4/10.  Described as an aching sensation.  Symptoms have been constant since onset.  Denies additional aggravating or alleviating factors.  Denies drooling, dysphasia or trismus.  Denies known contacts with COVID-19 patients. No HA, facial asymmetry, numbness, weakness or recent chiropractor adjustments.  History obtained from patient.  No interpreter was used.     HPI  Past Medical History:  Diagnosis Date   Asthma     There are no active problems to display for this patient.   Past Surgical History:  Procedure Laterality Date   TONSILLECTOMY       OB History   No  obstetric history on file.      Home Medications    Prior to Admission medications   Medication Sig Start Date End Date Taking? Authorizing Provider  clindamycin (CLEOCIN) 300 MG capsule Take 1 capsule (300 mg total) by mouth 3 (three) times daily for 7 days. 01/22/19 01/29/19  Negan Grudzien A, PA-C  cyclobenzaprine (FLEXERIL) 10 MG tablet Take 1 tablet (10 mg total) by mouth 2 (two) times daily as needed for muscle spasms. 01/31/18   Ward, Ozella Almond, PA-C  fluticasone (FLONASE) 50 MCG/ACT nasal spray Place 1 spray into both nostrils daily. 08/22/18   Volanda Napoleon, PA-C  ibuprofen (ADVIL,MOTRIN) 800 MG tablet Take 1 tablet (800 mg total) by mouth every 8 (eight) hours as needed. 01/31/18   Ward, Ozella Almond, PA-C  trimethoprim-polymyxin b (POLYTRIM) ophthalmic solution Place 1 drop into both eyes every 6 (six) hours. X 5 days 12/23/18   Ward, Delice Bison, DO    Family History History reviewed. No pertinent family history.  Social History Social History   Tobacco Use   Smoking status: Current Every Day Smoker    Packs/day: 1.00    Types: Cigars   Smokeless tobacco: Never Used  Substance Use Topics   Alcohol use: Yes    Comment: "Not really ever"   Drug use: No     Allergies   Shellfish allergy; Amoxicillin; and Penicillins   Review of Systems Review of Systems  Constitutional:  Negative.   HENT: Positive for congestion, ear pain and sore throat. Negative for dental problem, drooling, ear discharge, facial swelling, hearing loss, nosebleeds, postnasal drip, rhinorrhea, sinus pressure, sinus pain, sneezing, tinnitus, trouble swallowing and voice change.   Eyes: Negative.   Respiratory: Negative.   Cardiovascular: Negative.   Genitourinary: Negative.   Musculoskeletal: Positive for neck pain. Negative for neck stiffness.  Skin: Negative.   Neurological: Negative.   All other systems reviewed and are negative.    Physical Exam Updated Vital Signs BP 131/90     Pulse 90    Temp 98.8 F (37.1 C) (Oral)    Resp 14    LMP 01/08/2019    SpO2 100%   Physical Exam Vitals signs and nursing note reviewed.  Constitutional:      General: She is not in acute distress.    Appearance: She is not ill-appearing, toxic-appearing or diaphoretic.  HENT:     Head: Normocephalic and atraumatic.     Jaw: There is normal jaw occlusion.     Right Ear: Tympanic membrane, ear canal and external ear normal. No drainage, swelling or tenderness. No middle ear effusion. There is no impacted cerumen. No hemotympanum. Tympanic membrane is not injected, scarred, perforated, erythematous, retracted or bulging.     Left Ear: Tympanic membrane, ear canal and external ear normal. No drainage, swelling or tenderness.  No middle ear effusion. There is no impacted cerumen. No hemotympanum. Tympanic membrane is not injected, scarred, perforated, erythematous, retracted or bulging.     Ears:     Comments: No Mastoid tenderness.    Nose: No congestion or rhinorrhea.     Comments: Clear rhinorrhea and congestion to bilateral nares.  No sinus tenderness.    Mouth/Throat:     Lips: Pink.     Mouth: Mucous membranes are moist.     Tonsils: No tonsillar exudate or tonsillar abscesses. 0 on the right. 0 on the left.     Comments: Posterior oropharynx clear.  Mucous membranes moist.  Tonsils without erythema or exudate.  Uvula midline without deviation.  No evidence of PTA or RPA.  No drooling, dysphasia or trismus.  Hoarse voice.  Sublingual area soft.  No submandibular swelling or evidence of Ludwig's angina. Eyes:     Comments: No horizontal, vertical or rotational nystagmus    Neck:     Musculoskeletal: Full passive range of motion without pain and normal range of motion. Normal range of motion. No neck rigidity, crepitus, injury, pain with movement, torticollis, spinous process tenderness or muscular tenderness.     Trachea: Trachea and phonation normal.     Meningeal: Brudzinski's sign  and Kernig's sign absent.      Comments: Full active and passive ROM without pain No midline or paraspinal tenderness No nuchal rigidity or meningeal signs  Cardiovascular:     Rate and Rhythm: Normal rate.     Pulses: Normal pulses.     Heart sounds: Normal heart sounds.     Comments: No murmurs rubs or gallops. Pulmonary:     Effort: Pulmonary effort is normal.     Breath sounds: Normal breath sounds and air entry.     Comments: Clear to auscultation bilaterally without wheeze, rhonchi or rales.  No accessory muscle usage.  Able speak in full sentences. Abdominal:     Comments: Soft, nontender without rebound or guarding.  No CVA tenderness.  Musculoskeletal:     Comments: Moves all 4 extremities without difficulty.  Lower extremities without  edema, erythema or warmth.  Lymphadenopathy:     Cervical: Cervical adenopathy present.     Comments: Tender cervical lymphadenopathy  Skin:    Comments: Brisk capillary refill.  No rashes or lesions.  Neurological:     Mental Status: She is alert.     Comments: Ambulatory in department without difficulty.   Mental Status:  Alert, oriented, thought content appropriate. Speech fluent without evidence of aphasia. Able to follow 2 step commands without difficulty.  Cranial Nerves:  II:  Peripheral visual fields grossly normal, pupils equal, round, reactive to light III,IV, VI: ptosis not present, extra-ocular motions intact bilaterally  V,VII: smile symmetric, facial light touch sensation equal VIII: hearing grossly normal bilaterally  IX,X: midline uvula rise  XI: bilateral shoulder shrug equal and strong XII: midline tongue extension  Motor:  5/5 in upper and lower extremities bilaterally including strong and equal grip strength and dorsiflexion/plantar flexion Sensory: Pinprick and light touch normal in all extremities.  Deep Tendon Reflexes: 2+ and symmetric  Cerebellar: normal finger-to-nose with bilateral upper extremities Gait:  normal gait and balance CV: distal pulses palpable throughout       ED Treatments / Results  Labs (all labs ordered are listed, but only abnormal results are displayed) Labs Reviewed  GROUP A STREP BY PCR  CBC WITH DIFFERENTIAL/PLATELET  BASIC METABOLIC PANEL  MONONUCLEOSIS SCREEN  I-STAT BETA HCG BLOOD, ED ([facility], [facility], [facility] ONLY)    EKG None  Radiology Ct Soft Tissue Neck W Contrast  Result Date: 01/22/2019 CLINICAL DATA:  Initial evaluation for acute sore throat, stridor. Evaluate for acute tonsillitis or epiglottitis. EXAM: CT NECK WITH CONTRAST TECHNIQUE: Multidetector CT imaging of the neck was performed using the standard protocol following the bolus administration of intravenous contrast. CONTRAST:  59mL OMNIPAQUE IOHEXOL 300 MG/ML  SOLN COMPARISON:  None available. FINDINGS: Pharynx and larynx: Oral cavity within normal limits. Prominent dental carie with periapical lucency at the left maxillary second molar. No acute inflammatory changes seen about the dentition. Palatine tonsils symmetric and within normal limits. No tonsillar or peritonsillar abscess. Parapharyngeal fat maintained. Nasopharynx normal. No retropharyngeal collection. Epiglottis within normal limits. Remainder of the hypopharynx and supraglottic larynx within normal limits. True cords symmetric and normal. Subglottic airway clear. Salivary glands: Salivary glands including the parotid and submandibular glands are normal. Thyroid: Normal. Lymph nodes: Enlarged right supraclavicular lymph node measures 1.9 x 2.2 cm (series 2, image 73). Extensive bulky adenopathy seen within the visualized mediastinum, with largest nodal conglomerate seen at the precarinal level measuring 2.5 x 4.5 cm (series 2, image 108). Bulky prevascular nodes measure up to approximately 2.8 cm in short axis. No other adenopathy within the neck. Vascular: Normal intravascular enhancement seen throughout the neck. Limited intracranial: Unremarkable.  Visualized orbits: Unremarkable. Mastoids and visualized paranasal sinuses: Mild mucoperiosteal thickening within the ethmoidal air cells and maxillary sinuses. Paranasal sinuses are otherwise clear. Mastoid air cells and middle ear cavities are well pneumatized and free of fluid. Skeleton: No acute osseous finding. No discrete lytic or blastic osseous lesions. Upper chest: Visualized lungs are clear. Other: None. IMPRESSION: 1. Abnormal bulky enlarged adenopathy within the right supraclavicular fossa and visualized upper mediastinum, most concerning for possible lymphoproliferative disorder. Possible reactive adenopathy or nodal metastases could also be considered. 2. Otherwise negative CT of the neck. No acute inflammatory changes identified. 3. Prominent dental carie at the left maxillary second mandibular molar. Electronically Signed   By: Jeannine Boga M.D.   On: 01/22/2019 23:07  Procedures Procedures (including critical care time)  Medications Ordered in ED Medications  iohexol (OMNIPAQUE) 300 MG/ML solution 100 mL (75 mLs Intravenous Contrast Given 01/22/19 2245)  sodium chloride (PF) 0.9 % injection (  Given 01/22/19 2256)  clindamycin (CLEOCIN) capsule 300 mg (300 mg Oral Given 01/22/19 2324)   Initial Impression / Assessment and Plan / ED Course  I have reviewed the triage vital signs and the nursing notes.  Pertinent labs & imaging results that were available during my care of the patient were reviewed by me and considered in my medical decision making (see chart for details).  28 year old female who appears otherwise well presents for evaluation of sore throat and neck pain. Afebrile, non septic, non ill appearing. Heart and lungs clear. Posterior oropharynx clear. Tonsils without erythema, edema or exudate. No PTA visible. No pooling of oral secretions. No drooling, dysphagia or trismus. She does have a horse voice. No submandibular swelling, no evidence of Ludwigs Angina. Tender  cervical lymphadenopathy. No neck stiffness or neck rigidity. No meningismus. No abd pain or tenderness over spleen. Ears without evidence of otitis. No HA or neurologic deficits. Given symptoms present x 1 month low suspicion for dissection as cause of neck pain.Given normal exam with neck tenderness and hoarse voice will obtain labs and CT soft tissue neck to assess for deep space infection. Will also order mononucleosis given length of symptoms.  Labs and imagine personally reviewed: HCG: negative CBC: without leukocytosis BMP: without electrolyte, renal or liver abnormalities Strep: Negative Mono: Negative CT soft tissue neck: Abnormal bulky enlarged adenopathy within the right supraclavicular fossa and visualized upper mediastinum, most concerning for possible lymphoproliferative disorder. Possible reactive adenopathy or nodal metastases could also be considered.   Discussed results with patient and attending physician. Recommends Clindamycin x 1 week and follow up with ENT. CBC is reassuring at this time. Patient able to tolerate PO intake in ED without difficulty.   Patient is hemodynamically stable and in no acute distress.  Patient able to ambulate in department prior to ED.  Evaluation does not show acute pathology that would require ongoing or additional emergent interventions while in the emergency department or further inpatient treatment.  I have discussed the diagnosis with the patient and answered all questions.  Patient has no further complaints prior to discharge.  Patient is comfortable with plan discussed in room and is stable for discharge at this time.  I have discussed strict return precautions for returning to the emergency department.  Patient was encouraged to follow-up with PCP/specialist refer to at discharge.  Patient seen and evaluated by attending physician Dr. Rex Kras who agrees with above treatment, plan and disposition.     Final Clinical Impressions(s) / ED  Diagnoses   Final diagnoses:  Sore throat  Lymphadenopathy    ED Discharge Orders         Ordered    clindamycin (CLEOCIN) 300 MG capsule  3 times daily     01/22/19 2316           Modesta Sammons A, PA-C 01/22/19 2325    Little, Wenda Overland, MD 01/22/19 [phone]

## 2019-03-15 ENCOUNTER — Other Ambulatory Visit: Payer: Self-pay | Admitting: *Deleted

## 2019-03-15 DIAGNOSIS — Z20822 Contact with and (suspected) exposure to covid-19: Secondary | ICD-10-CM

## 2019-03-15 NOTE — Addendum Note (Signed)
Addended by: Brigitte Pulse on: 03/15/2019 08:46 PM   Modules accepted: Orders

## 2019-03-17 LAB — NOVEL CORONAVIRUS, NAA: SARS-CoV-2, NAA: NOT DETECTED

## 2019-05-04 NOTE — Pre-Procedure Instructions (Signed)
[facility]  05/04/2019      Faith [facility], [facility] Phone: [phone] Fax: [phone]    Your procedure is scheduled on 05/11/19.  Report to [facility] Admitting at 530 A.M.  Call this number if you have problems the morning of surgery:  [phone]   Remember:  Do not eat or drink after midnight.  Y Take these medicines the morning of surgery with A SIP OF WATER ----all inhalers    Do not wear jewelry, make-up or nail polish.  Do not wear lotions, powders, or perfumes, or deodorant.  Do not shave 48 hours prior to surgery.  Men may shave face and neck.  Do not bring valuables to the [facility].  [facility] is not responsible for any belongings or valuables.  Contacts, dentures or bridgework may not be worn into surgery.  Leave your suitcase in the car.  After surgery it may be brought to your room.  For patients admitted to the [facility], discharge time will be determined by your treatment team.  Patients discharged the day of surgery will not be allowed to drive home.    Special instructions:  Do not take any aspirin,anti-inflammatories,vitamins,or herbal supplements 5-7 days prior to surgery.[facility] - Preparing for Surgery  Before surgery, you can play an important role.  Because skin is not sterile, your skin needs to be as free of germs as possible.  You can reduce the number of germs on you skin by washing with CHG (chlorahexidine gluconate) soap before surgery.  CHG is an antiseptic cleaner which kills germs and bonds with the skin to continue killing germs even after washing.  Oral Hygiene is also important in reducing the risk of infection.  Remember to brush your teeth with your regular toothpaste the morning of surgery.  Please DO NOT use if you have an allergy to CHG or antibacterial soaps.  If your skin becomes reddened/irritated stop using the CHG and inform your  nurse when you arrive at Short Stay.  Do not shave (including legs and underarms) for at least 48 hours prior to the first CHG shower.  You may shave your face.  Please follow these instructions carefully:   1.  Shower with CHG Soap the night before surgery and the morning of Surgery.  2.  If you choose to wash your hair, wash your hair first as usual with your normal shampoo.  3.  After you shampoo, rinse your hair and body thoroughly to remove the shampoo. 4.  Use CHG as you would any other liquid soap.  You can apply chg directly to the skin and wash gently with a      scrungie or washcloth.           5.  Apply the CHG Soap to your body ONLY FROM THE NECK DOWN.   Do not use on open wounds or open sores. Avoid contact with your eyes, ears, mouth and genitals (private parts).  Wash genitals (private parts) with your normal soap.  6.  Wash thoroughly, paying special attention to the area where your surgery will be performed.  7.  Thoroughly rinse your body with warm water from the neck down.  8.  DO NOT shower/wash with your normal soap after using and rinsing off the CHG Soap.  9.  Pat yourself dry with a clean towel.  10.  Wear clean pajamas.            11.  Place clean sheets on your bed the night of your first shower and do not sleep with pets.  Day of Surgery  Do not apply any lotions/deoderants the morning of surgery.   Please wear clean clothes to the [facility] center. Remember to brush your teeth with toothpaste.    Please read over the following fact sheets that you were given.

## 2019-05-05 ENCOUNTER — Inpatient Hospital Stay (HOSPITAL_COMMUNITY)
Admission: RE | Admit: 2019-05-05 | Discharge: 2019-05-05 | Disposition: A | Discharge status: Home / Self Care | Payer: Medicaid Other | Source: Ambulatory Visit

## 2019-05-05 NOTE — Pre-Procedure Instructions (Signed)
Faith Pope, [place] Pope [address] Phone: [phone] Fax: [phone]      Your procedure is scheduled on Tuesday September 15th.  Report to [facility] Main Entrance "A" at 5:30 A.M., and check in at the Admitting office.  Call this number if you have problems the morning of surgery:  [phone]  Call [phone] if you have any questions prior to your surgery date Monday-Friday 8am-4pm    Remember:  Do not eat or drink after midnight the night before your surgery     Take these medicines the morning of surgery with A SIP OF WATER albuterol (VENTOLIN HFA) if needed    7 days prior to surgery STOP taking any Aspirin (unless otherwise instructed by your surgeon), Aleve, Naproxen, Ibuprofen, Motrin, Advil, Goody's, BC's, all herbal medications, fish oil, and all vitamins.    The Morning of Surgery  Do not wear jewelry, make-up or nail polish.  Do not wear lotions, powders, or perfumes/colognes, or deodorant  Do not shave 48 hours prior to surgery.  Men may shave face and neck.  Do not bring valuables to the hospital.  [facility] is not responsible for any belongings or valuables.  If you are a smoker, DO NOT Smoke 24 hours prior to surgery IF you wear a CPAP at night please bring your mask, tubing, and machine the morning of surgery   Remember that you must have someone to transport you home after your surgery, and remain with you for 24 hours if you are discharged the same day.   Contacts, glasses, hearing aids, dentures or bridgework may not be worn into surgery.    Leave your suitcase in the car.  After surgery it may be brought to your room.  For patients admitted to the hospital, discharge time will be determined by your treatment team.  Patients discharged the day of surgery will not be allowed to drive home.    Special instructions:   - Preparing For Surgery  Before  surgery, you can play an important role. Because skin is not sterile, your skin needs to be as free of germs as possible. You can reduce the number of germs on your skin by washing with CHG (chlorahexidine gluconate) Soap before surgery.  CHG is an antiseptic cleaner which kills germs and bonds with the skin to continue killing germs even after washing.    Oral Hygiene is also important to reduce your risk of infection.  Remember - BRUSH YOUR TEETH THE MORNING OF SURGERY WITH YOUR REGULAR TOOTHPASTE  Please do not use if you have an allergy to CHG or antibacterial soaps. If your skin becomes reddened/irritated stop using the CHG.  Do not shave (including legs and underarms) for at least 48 hours prior to first CHG shower. It is OK to shave your face.  Please follow these instructions carefully.   1. Shower the NIGHT BEFORE SURGERY and the MORNING OF SURGERY with CHG Soap.   2. If you chose to wash your hair, wash your hair first as usual with your normal shampoo.  3. After you shampoo, rinse your hair and body thoroughly to remove the shampoo.  4. Use CHG as you would any other liquid soap. You can apply CHG directly to the skin and wash gently with a scrungie or a clean washcloth.   5. Apply the CHG Soap to your body ONLY FROM THE NECK DOWN.  Do not use  on open wounds or open sores. Avoid contact with your eyes, ears, mouth and genitals (private parts). Wash Face and genitals (private parts)  with your normal soap.   6. Wash thoroughly, paying special attention to the area where your surgery will be performed.  7. Thoroughly rinse your body with warm water from the neck down.  8. DO NOT shower/wash with your normal soap after using and rinsing off the CHG Soap.  9. Pat yourself dry with a CLEAN TOWEL.  10. Wear CLEAN PAJAMAS to bed the night before surgery, wear comfortable clothes the morning of surgery  11. Place CLEAN SHEETS on your bed the night of your first shower and DO NOT SLEEP  WITH PETS.    Day of Surgery:  Do not apply any deodorants/lotions. Please shower the morning of surgery with the CHG soap  Please wear clean clothes to the hospital/surgery center.   Remember to brush your teeth WITH YOUR REGULAR TOOTHPASTE.   Please read over the following fact sheets that you were given.

## 2019-05-07 ENCOUNTER — Other Ambulatory Visit (HOSPITAL_COMMUNITY)
Admission: RE | Admit: 2019-05-07 | Discharge: 2019-05-07 | Disposition: A | Discharge status: Home / Self Care | Payer: HRSA Program | Source: Ambulatory Visit | Attending: Otolaryngology | Admitting: Otolaryngology

## 2019-05-07 DIAGNOSIS — Z01812 Encounter for preprocedural laboratory examination: Secondary | ICD-10-CM | POA: Diagnosis present

## 2019-05-07 DIAGNOSIS — Z20828 Contact with and (suspected) exposure to other viral communicable diseases: Secondary | ICD-10-CM | POA: Diagnosis not present

## 2019-05-08 LAB — NOVEL CORONAVIRUS, NAA (HOSP ORDER, SEND-OUT TO REF LAB; TAT 18-24 HRS): SARS-CoV-2, NAA: NOT DETECTED

## 2019-05-10 ENCOUNTER — Encounter (HOSPITAL_COMMUNITY): Payer: Self-pay | Admitting: Certified Registered Nurse Anesthetist

## 2019-05-10 NOTE — Progress Notes (Signed)
Denies chest pain, shob, or cardiology visit. Educated on Environmental manager. States compliance with quarantine instructions.

## 2019-05-11 ENCOUNTER — Other Ambulatory Visit: Payer: Self-pay

## 2019-05-11 ENCOUNTER — Encounter (HOSPITAL_COMMUNITY): Payer: Self-pay | Admitting: Certified Registered Nurse Anesthetist

## 2019-05-11 ENCOUNTER — Ambulatory Visit (HOSPITAL_COMMUNITY): Payer: Self-pay | Admitting: Physician Assistant

## 2019-05-11 ENCOUNTER — Ambulatory Visit (HOSPITAL_COMMUNITY)
Admission: RE | Admit: 2019-05-11 | Discharge: 2019-05-11 | Disposition: A | Discharge status: Home / Self Care | Payer: Self-pay | Attending: Otolaryngology | Admitting: Otolaryngology

## 2019-05-11 ENCOUNTER — Ambulatory Visit (HOSPITAL_COMMUNITY): Payer: Self-pay | Admitting: Certified Registered"

## 2019-05-11 ENCOUNTER — Encounter (HOSPITAL_COMMUNITY)
Admission: RE | Disposition: A | Discharge status: Home / Self Care | Payer: Self-pay | Source: Home / Self Care | Attending: Otolaryngology

## 2019-05-11 DIAGNOSIS — C8111 Nodular sclerosis classical Hodgkin lymphoma, lymph nodes of head, face, and neck: Secondary | ICD-10-CM | POA: Insufficient documentation

## 2019-05-11 DIAGNOSIS — F17290 Nicotine dependence, other tobacco product, uncomplicated: Secondary | ICD-10-CM | POA: Insufficient documentation

## 2019-05-11 DIAGNOSIS — J45909 Unspecified asthma, uncomplicated: Secondary | ICD-10-CM | POA: Insufficient documentation

## 2019-05-11 DIAGNOSIS — Z880 Allergy status to penicillin: Secondary | ICD-10-CM | POA: Insufficient documentation

## 2019-05-11 HISTORY — PX: MASS BIOPSY: SHX5445

## 2019-05-11 LAB — POCT PREGNANCY, URINE: Preg Test, Ur: NEGATIVE

## 2019-05-11 LAB — HEMOGLOBIN: Hemoglobin: 12.8 g/dL (ref 12.0–15.0)

## 2019-05-11 SURGERY — BIOPSY, MASS, NECK
Anesthesia: General | Site: Neck | Laterality: Right

## 2019-05-11 MED ORDER — FENTANYL CITRATE (PF) 100 MCG/2ML IJ SOLN: 25.0000 ug | INTRAMUSCULAR | Status: DC | PRN

## 2019-05-11 MED ORDER — ONDANSETRON HCL 4 MG/2ML IJ SOLN: 4.0000 mg | Freq: Four times a day (QID) | INTRAMUSCULAR | Status: DC | PRN

## 2019-05-11 MED ORDER — CLINDAMYCIN PHOSPHATE 600 MG/50ML IV SOLN
600.0000 mg | Freq: Once | INTRAVENOUS | Status: AC
  Administered 2019-05-11: 08:00:00 600 mg via INTRAVENOUS

## 2019-05-11 MED ORDER — CLINDAMYCIN PHOSPHATE 600 MG/50ML IV SOLN
INTRAVENOUS | Status: AC
  Filled 2019-05-11: qty 50

## 2019-05-11 MED ORDER — PHENYLEPHRINE 40 MCG/ML (10ML) SYRINGE FOR IV PUSH (FOR BLOOD PRESSURE SUPPORT)
PREFILLED_SYRINGE | INTRAVENOUS | Status: DC | PRN
  Administered 2019-05-11 (×2): 80 ug via INTRAVENOUS

## 2019-05-11 MED ORDER — 0.9 % SODIUM CHLORIDE (POUR BTL) OPTIME
TOPICAL | Status: DC | PRN
  Administered 2019-05-11: 08:00:00 1000 mL

## 2019-05-11 MED ORDER — GLYCOPYRROLATE PF 0.2 MG/ML IJ SOSY
PREFILLED_SYRINGE | INTRAMUSCULAR | Status: DC | PRN
  Administered 2019-05-11: .1 mg via INTRAVENOUS

## 2019-05-11 MED ORDER — ONDANSETRON HCL 4 MG/2ML IJ SOLN
INTRAMUSCULAR | Status: AC
  Filled 2019-05-11: qty 2

## 2019-05-11 MED ORDER — DIPHENHYDRAMINE HCL 50 MG/ML IJ SOLN
INTRAMUSCULAR | Status: DC | PRN
  Administered 2019-05-11: 12.5 mg via INTRAVENOUS

## 2019-05-11 MED ORDER — ALBUTEROL SULFATE HFA 108 (90 BASE) MCG/ACT IN AERS
INHALATION_SPRAY | RESPIRATORY_TRACT | Status: DC | PRN
  Administered 2019-05-11: 8 via RESPIRATORY_TRACT

## 2019-05-11 MED ORDER — PROPOFOL 10 MG/ML IV BOLUS
INTRAVENOUS | Status: DC | PRN
  Administered 2019-05-11: 200 mg via INTRAVENOUS

## 2019-05-11 MED ORDER — OXYCODONE HCL 5 MG PO TABS
5.0000 mg | ORAL_TABLET | Freq: Once | ORAL | Status: AC | PRN
  Administered 2019-05-11: 5 mg via ORAL

## 2019-05-11 MED ORDER — LIDOCAINE-EPINEPHRINE 1 %-1:100000 IJ SOLN
INTRAMUSCULAR | Status: AC
  Filled 2019-05-11: qty 1

## 2019-05-11 MED ORDER — ALBUTEROL SULFATE HFA 108 (90 BASE) MCG/ACT IN AERS
INHALATION_SPRAY | RESPIRATORY_TRACT | Status: AC
  Filled 2019-05-11: qty 6.7

## 2019-05-11 MED ORDER — FENTANYL CITRATE (PF) 250 MCG/5ML IJ SOLN
INTRAMUSCULAR | Status: DC | PRN
  Administered 2019-05-11 (×2): 100 ug via INTRAVENOUS

## 2019-05-11 MED ORDER — DEXMEDETOMIDINE HCL IN NACL 200 MCG/50ML IV SOLN
INTRAVENOUS | Status: DC | PRN
  Administered 2019-05-11: 12 ug via INTRAVENOUS

## 2019-05-11 MED ORDER — BACITRACIN ZINC 500 UNIT/GM EX OINT
TOPICAL_OINTMENT | CUTANEOUS | Status: AC
  Filled 2019-05-11: qty 28.35

## 2019-05-11 MED ORDER — LACTATED RINGERS IV SOLN
INTRAVENOUS | Status: DC | PRN
  Administered 2019-05-11: 07:00:00 via INTRAVENOUS

## 2019-05-11 MED ORDER — OXYCODONE HCL 5 MG PO TABS
ORAL_TABLET | ORAL | Status: AC
  Filled 2019-05-11: qty 1

## 2019-05-11 MED ORDER — SUCCINYLCHOLINE CHLORIDE 200 MG/10ML IV SOSY
PREFILLED_SYRINGE | INTRAVENOUS | Status: AC
  Filled 2019-05-11: qty 10

## 2019-05-11 MED ORDER — DEXAMETHASONE SODIUM PHOSPHATE 10 MG/ML IJ SOLN
INTRAMUSCULAR | Status: AC
  Filled 2019-05-11: qty 1

## 2019-05-11 MED ORDER — LIDOCAINE-EPINEPHRINE 1 %-1:100000 IJ SOLN
INTRAMUSCULAR | Status: DC | PRN
  Administered 2019-05-11: 3 mL

## 2019-05-11 MED ORDER — STERILE WATER FOR IRRIGATION IR SOLN
Status: DC | PRN
  Administered 2019-05-11: 1000 mL

## 2019-05-11 MED ORDER — MIDAZOLAM HCL 2 MG/2ML IJ SOLN
INTRAMUSCULAR | Status: AC
  Filled 2019-05-11: qty 2

## 2019-05-11 MED ORDER — ONDANSETRON HCL 4 MG/2ML IJ SOLN
INTRAMUSCULAR | Status: DC | PRN
  Administered 2019-05-11: 4 mg via INTRAVENOUS

## 2019-05-11 MED ORDER — GLYCOPYRROLATE PF 0.2 MG/ML IJ SOSY
PREFILLED_SYRINGE | INTRAMUSCULAR | Status: AC
  Filled 2019-05-11: qty 1

## 2019-05-11 MED ORDER — FENTANYL CITRATE (PF) 250 MCG/5ML IJ SOLN
INTRAMUSCULAR | Status: AC
  Filled 2019-05-11: qty 5

## 2019-05-11 MED ORDER — OXYCODONE HCL 5 MG/5ML PO SOLN: 5.0000 mg | Freq: Once | ORAL | Status: AC | PRN

## 2019-05-11 MED ORDER — LIDOCAINE 2% (20 MG/ML) 5 ML SYRINGE
INTRAMUSCULAR | Status: DC | PRN
  Administered 2019-05-11: 60 mg via INTRAVENOUS

## 2019-05-11 MED ORDER — LIDOCAINE 2% (20 MG/ML) 5 ML SYRINGE
INTRAMUSCULAR | Status: AC
  Filled 2019-05-11: qty 5

## 2019-05-11 MED ORDER — MIDAZOLAM HCL 2 MG/2ML IJ SOLN
INTRAMUSCULAR | Status: DC | PRN
  Administered 2019-05-11: 2 mg via INTRAVENOUS

## 2019-05-11 MED ORDER — PROPOFOL 10 MG/ML IV BOLUS
INTRAVENOUS | Status: AC
  Filled 2019-05-11: qty 20

## 2019-05-11 MED ORDER — DIPHENHYDRAMINE HCL 50 MG/ML IJ SOLN
INTRAMUSCULAR | Status: AC
  Filled 2019-05-11: qty 1

## 2019-05-11 MED ORDER — SUCCINYLCHOLINE CHLORIDE 200 MG/10ML IV SOSY
PREFILLED_SYRINGE | INTRAVENOUS | Status: DC | PRN
  Administered 2019-05-11: 100 mg via INTRAVENOUS

## 2019-05-11 MED ORDER — DEXAMETHASONE SODIUM PHOSPHATE 10 MG/ML IJ SOLN
INTRAMUSCULAR | Status: DC | PRN
  Administered 2019-05-11: 5 mg via INTRAVENOUS

## 2019-05-11 SURGICAL SUPPLY — 34 items
BLADE SURG 15 STRL LF DISP TIS (BLADE) IMPLANT
BLADE SURG 15 STRL SS (BLADE) ×2
CANISTER SUCT 3000ML PPV (MISCELLANEOUS) ×2 IMPLANT
CLEANER TIP ELECTROSURG 2X2 (MISCELLANEOUS) ×1 IMPLANT
CONT SPEC 4OZ CLIKSEAL STRL BL (MISCELLANEOUS) ×3 IMPLANT
COVER SURGICAL LIGHT HANDLE (MISCELLANEOUS) ×3 IMPLANT
COVER WAND RF STERILE (DRAPES) ×3 IMPLANT
DERMABOND ADVANCED (GAUZE/BANDAGES/DRESSINGS) ×2
DERMABOND ADVANCED .7 DNX12 (GAUZE/BANDAGES/DRESSINGS) IMPLANT
DRAIN PENROSE 1/4X12 LTX STRL (WOUND CARE) IMPLANT
DRAPE HALF SHEET 40X57 (DRAPES) IMPLANT
DRSG EMULSION OIL 3X3 NADH (GAUZE/BANDAGES/DRESSINGS) IMPLANT
ELECT COATED BLADE 2.86 ST (ELECTRODE) ×3 IMPLANT
ELECT NDL TIP 2.8 STRL (NEEDLE) IMPLANT
ELECT NEEDLE TIP 2.8 STRL (NEEDLE) IMPLANT
ELECT REM PT RETURN 9FT ADLT (ELECTROSURGICAL) ×3
ELECTRODE REM PT RTRN 9FT ADLT (ELECTROSURGICAL) ×1 IMPLANT
GAUZE SPONGE 4X4 12PLY STRL (GAUZE/BANDAGES/DRESSINGS) IMPLANT
GLOVE BIO SURGEON STRL SZ7.5 (GLOVE) ×3 IMPLANT
GOWN STRL REUS W/ TWL LRG LVL3 (GOWN DISPOSABLE) ×2 IMPLANT
GOWN STRL REUS W/TWL LRG LVL3 (GOWN DISPOSABLE) ×4
KIT BASIN OR (CUSTOM PROCEDURE TRAY) ×3 IMPLANT
KIT TURNOVER KIT B (KITS) ×2 IMPLANT
NDL HYPO 25GX1X1/2 BEV (NEEDLE) IMPLANT
NEEDLE HYPO 25GX1X1/2 BEV (NEEDLE) ×3 IMPLANT
NS IRRIG 1000ML POUR BTL (IV SOLUTION) ×3 IMPLANT
PAD ARMBOARD 7.5X6 YLW CONV (MISCELLANEOUS) ×6 IMPLANT
PENCIL BUTTON HOLSTER BLD 10FT (ELECTRODE) ×3 IMPLANT
POSITIONER HEAD DONUT 9IN (MISCELLANEOUS) ×2 IMPLANT
SUT VIC AB 3-0 SH 27 (SUTURE) ×2
SUT VIC AB 3-0 SH 27X BRD (SUTURE) IMPLANT
SUT VIC AB 4-0 PS2 27 (SUTURE) ×2 IMPLANT
SYR BULB IRRIGATION 50ML (SYRINGE) ×2 IMPLANT
TRAY ENT MC OR (CUSTOM PROCEDURE TRAY) ×3 IMPLANT

## 2019-05-11 NOTE — Anesthesia Procedure Notes (Signed)
Procedure Name: Intubation Date/Time: 05/11/2019 7:36 AM Performed by: Alain Marion, CRNA Pre-anesthesia Checklist: Patient identified, Emergency Drugs available, Suction available and Patient being monitored Patient Re-evaluated:Patient Re-evaluated prior to induction Oxygen Delivery Method: Circle System Utilized Preoxygenation: Pre-oxygenation with 100% oxygen Induction Type: IV induction and Rapid sequence Laryngoscope Size: Miller and 2 Grade View: Grade I Tube type: Oral Tube size: 7.0 mm Number of attempts: 1 Airway Equipment and Method: Stylet Placement Confirmation: ETT inserted through vocal cords under direct vision,  positive ETCO2 and breath sounds checked- equal and bilateral Secured at: 22 cm Tube secured with: Tape Dental Injury: Teeth and Oropharynx as per pre-operative assessment

## 2019-05-11 NOTE — Anesthesia Preprocedure Evaluation (Signed)
Anesthesia Evaluation  Patient identified by MRN, date of birth, ID band Patient awake    Reviewed: Allergy & Precautions, H&P , NPO status , Patient's Chart, lab work & pertinent test results  Airway Mallampati: II   Neck ROM: full    Dental   Pulmonary asthma , Current Smoker,    breath sounds clear to auscultation       Cardiovascular negative cardio ROS   Rhythm:regular Rate:Normal     Neuro/Psych    GI/Hepatic   Endo/Other    Renal/GU      Musculoskeletal   Abdominal   Peds  Hematology   Anesthesia Other Findings   Reproductive/Obstetrics                             Anesthesia Physical Anesthesia Plan  ASA: II  Anesthesia Plan: General   Post-op Pain Management:    Induction: Intravenous  PONV Risk Score and Plan: 2 and Ondansetron, Dexamethasone, Midazolam and Treatment may vary due to age or medical condition  Airway Management Planned: Oral ETT  Additional Equipment:   Intra-op Plan:   Post-operative Plan: Extubation in OR  Informed Consent: I have reviewed the patients History and Physical, chart, labs and discussed the procedure including the risks, benefits and alternatives for the proposed anesthesia with the patient or authorized representative who has indicated his/her understanding and acceptance.       Plan Discussed with: CRNA, Anesthesiologist and Surgeon  Anesthesia Plan Comments:         Anesthesia Quick Evaluation

## 2019-05-11 NOTE — Op Note (Signed)
NAMEALBANY, KOSAR MEDICAL RECORD [id number] ACCOUNT [id number] DATE OF BIRTH:December 14, 1990 FACILITY: [facility] LOCATION: [facility] PHYSICIAN:Avayah Raffety Guido Sander, MD  OPERATIVE REPORT  DATE OF PROCEDURE:  05/11/2019  PREOPERATIVE DIAGNOSIS:  Cervical lymphadenopathy.  POSTOPERATIVE DIAGNOSIS:  Cervical lymphadenopathy.  PROCEDURE:  Excisional biopsy of right cervical lymph nodes.  SURGEON:  Melida Quitter, MD  ANESTHESIA:  General endotracheal anesthesia.  COMPLICATIONS:  None.  INDICATIONS:  The patient is a 28 year old female who has noticed enlarged lymph nodes in the right neck since May.  Around that time, she had some dental work.  With resolution of the dental problems, the nodes have not gone down.  She presents to the  operating room for excisional biopsy.  FINDINGS:  There was a sizable lymph node found in the right zone 4 region just posterolateral to the internal jugular vein that was removed and sent for lymphoma protocol.  DESCRIPTION OF PROCEDURE:  The patient was identified in the holding room, informed consent having been obtained including discussion of risks, benefits and alternatives, the patient was brought to the operative suite and put the operative table in  supine position.  Anesthesia was induced and the patient was intubated by the anesthesia team without difficulty.  The patient was given intravenous antibiotics during the case.  The eyes were taped closed and a shoulder roll was placed.  The patient was  given intravenous antibiotics during the case.  Incision was marked with a marking pen and injected with 1% lidocaine with 1:100,000 epinephrine.  The neck was prepped and draped in sterile fashion.  The incision was made with a 15 blade scalpel and  extended through the subcutaneous and platysmal layers using Bovie electrocautery.  Blunt dissection was then performed deeply through some of the strap muscles down to the internal jugular vein, which was elevated and  retracted anteriorly.  The node was  immediately adjacent and was then carefully dissected from surrounding tissues using Bovie electrocautery and retraction.  Ultimately, the node was able to be removed and passed to nursing for pathology.  The wound was then copiously irrigated with  saline.  Bleeding was controlled with Bovie electrocautery.  The platysmal layer was closed with 3-0 Vicryl suture in a simple interrupted fashion and the skin was closed in the subcutaneous layer using 4-0 Vicryl suture in a simple interrupted fashion  and Dermabond on the skin level.  Drapes were removed.  The patient was cleaned off.  She was returned to Anesthesia for wakeup.  The patient was extubated in the recovery room in stable condition.  TN/NUANCE  D:05/11/2019 T:05/11/2019 JOB:[id number]

## 2019-05-11 NOTE — H&P (Signed)
Faith Pope is an 28 y.o. female.   Chief Complaint: Cervical lymphadenopathy HPI: 28 year old female with enlarged right-sided cervical nodes since May that followed dental work.  The nodes did not resolve with improvement of her tooth problem.  She presents for excisional biopsy.  Past Medical History:  Diagnosis Date  . Asthma     Past Surgical History:  Procedure Laterality Date  . TONSILLECTOMY      History reviewed. No pertinent family history. Social History:  reports that she has been smoking cigars. She has been smoking about 1.00 pack per day. She has never used smokeless tobacco. She reports current alcohol use. She reports that she does not use drugs.  Allergies:  Allergies  Allergen Reactions  . Shellfish Allergy Other (See Comments)    Mouth burns and tongue numb  . Amoxicillin Rash  . Penicillins Rash    Has patient had a PCN reaction causing immediate rash, facial/tongue/throat swelling, SOB or lightheadedness with hypotension: rash Has patient had a PCN reaction causing severe rash involving mucus membranes or skin necrosis: No Has patient had a PCN reaction that required hospitalization: No Has patient had a PCN reaction occurring within the last 10 years: No If all of the above answers are "NO", then may proceed with Cephalosporin use.    Medications Prior to Admission  Medication Sig Dispense Refill  . albuterol (VENTOLIN HFA) 108 (90 Base) MCG/ACT inhaler Inhale 1-2 puffs into the lungs every 6 (six) hours as needed for wheezing or shortness of breath.    Marland Kitchen aspirin-acetaminophen-caffeine (EXCEDRIN MIGRAINE) 250-250-65 MG tablet Take 2 tablets by mouth every 6 (six) hours as needed for headache.    . cyclobenzaprine (FLEXERIL) 10 MG tablet Take 1 tablet (10 mg total) by mouth 2 (two) times daily as needed for muscle spasms. (Patient not taking: Reported on 04/29/2019) 20 tablet 0  . fluticasone (FLONASE) 50 MCG/ACT nasal spray Place 1 spray into both nostrils  daily. (Patient not taking: Reported on 04/29/2019) 16 g 2  . ibuprofen (ADVIL,MOTRIN) 800 MG tablet Take 1 tablet (800 mg total) by mouth every 8 (eight) hours as needed. (Patient not taking: Reported on 04/29/2019) 21 tablet 0  . trimethoprim-polymyxin b (POLYTRIM) ophthalmic solution Place 1 drop into both eyes every 6 (six) hours. X 5 days (Patient not taking: Reported on 04/29/2019) 10 mL 0    Results for orders placed or performed during the hospital encounter of 05/11/19 (from the past 48 hour(s))  Hemoglobin     Status: None   Collection Time: 05/11/19  6:25 AM  Result Value Ref Range   Hemoglobin 12.8 12.0 - 15.0 g/dL    Comment: Performed at [facility], [place] [address] [place], [place] [address]  Pregnancy, urine POC     Status: None   Collection Time: 05/11/19  6:31 AM  Result Value Ref Range   Preg Test, Ur NEGATIVE NEGATIVE    Comment:        THE SENSITIVITY OF THIS METHODOLOGY IS >24 mIU/mL    No results found.  Review of Systems  All other systems reviewed and are negative.   Blood pressure (!) 141/75, pulse 93, temperature 98.6 F (37 C), temperature source Oral, resp. rate 20, height 5\' 2"  (1.575 m), weight 80.3 kg, last menstrual period 04/21/2019, SpO2 100 %. Physical Exam  Constitutional: She is oriented to person, place, and time. She appears well-developed and well-nourished. No distress.  HENT:  Head: Normocephalic and atraumatic.  Right  Ear: External ear normal.  Left Ear: External ear normal.  Nose: Nose normal.  Mouth/Throat: Oropharynx is clear and moist.  Eyes: Pupils are equal, round, and reactive to light. Conjunctivae and EOM are normal.  Neck: Normal range of motion. Neck supple.  3 cm easily palpable node in right lower neck.  Cardiovascular: Normal rate.  Respiratory: Effort normal.  Neurological: She is alert and oriented to person, place, and time. No cranial nerve deficit.  Skin: Skin is warm and dry.  Psychiatric: She has a normal  mood and affect. Her behavior is normal. Judgment and thought content normal.     Assessment/Plan Cervical lymphadenopathy  To OR for excisional biopsy of right cervical node.  Melida Quitter, MD 05/11/2019, 7:22 AM

## 2019-05-11 NOTE — Brief Op Note (Signed)
05/11/2019  8:18 AM  PATIENT:  Faith Pope  28 y.o. female  PRE-OPERATIVE DIAGNOSIS:  R59.1 Lymphadenopathy  POST-OPERATIVE DIAGNOSIS:  R59.1 Lymphadenopathy  PROCEDURE:  Procedure(s): EXCISIONAL BIOPSY OF RIGHT CERVICAL LYMPH NODE (Right)  SURGEON:  Surgeon(s) and Role:    Melida Quitter, MD - Primary  PHYSICIAN ASSISTANT:   ASSISTANTS: none   ANESTHESIA:   general  EBL: Minimal  BLOOD ADMINISTERED:none  DRAINS: none   LOCAL MEDICATIONS USED:  LIDOCAINE   SPECIMEN:  Source of Specimen:  Right zone 4 node  DISPOSITION OF SPECIMEN:  PATHOLOGY  COUNTS:  YES  TOURNIQUET:  * No tourniquets in log *  DICTATION: .Other Dictation: Dictation Number [id number]  PLAN OF CARE: Discharge to home after PACU  PATIENT DISPOSITION:  PACU - hemodynamically stable.   Delay start of Pharmacological VTE agent (>24hrs) due to surgical blood loss or risk of bleeding: no

## 2019-05-11 NOTE — Anesthesia Postprocedure Evaluation (Signed)
Anesthesia Post Note  Patient: Faith Pope  Procedure(s) Performed: EXCISIONAL BIOPSY OF RIGHT CERVICAL LYMPH NODE (Right Neck)     Patient location during evaluation: PACU Anesthesia Type: General Level of consciousness: awake and alert Pain management: pain level controlled Vital Signs Assessment: post-procedure vital signs reviewed and stable Respiratory status: spontaneous breathing, nonlabored ventilation, respiratory function stable and patient connected to nasal cannula oxygen Cardiovascular status: blood pressure returned to baseline and stable Postop Assessment: no apparent nausea or vomiting Anesthetic complications: no    Last Vitals:  Vitals:   05/11/19 0900 05/11/19 0915  BP: (!) 140/97 124/61  Pulse: (!) 121 97  Resp: (!) 37 (!) 22  Temp:  36.8 C  SpO2: 94% 97%    Last Pain:  Vitals:   05/11/19 0639  TempSrc:   PainSc: 0-No pain                 Barbara Ahart S

## 2019-05-11 NOTE — Transfer of Care (Signed)
Immediate Anesthesia Transfer of Care Note  Patient: Faith Pope  Procedure(s) Performed: EXCISIONAL BIOPSY OF RIGHT CERVICAL LYMPH NODE (Right Neck)  Patient Location: PACU  Anesthesia Type:General  Level of Consciousness: awake, alert  and oriented  Airway & Oxygen Therapy: Patient Spontanous Breathing and Patient connected to face mask oxygen  Post-op Assessment: Report given to RN and Post -op Vital signs reviewed and stable  Post vital signs: Reviewed and stable  Last Vitals:  Vitals Value Taken Time  BP 124/61 05/11/19 0849  Temp 36.4 C 05/11/19 0849  Pulse 121 05/11/19 0859  Resp 37 05/11/19 0857  SpO2 94 % 05/11/19 0859  Vitals shown include unvalidated device data.  Last Pain:  Vitals:   05/11/19 0639  TempSrc:   PainSc: 0-No pain      Patients Stated Pain Goal: 3 ([id number])  Complications: No apparent anesthesia complications

## 2019-05-12 ENCOUNTER — Other Ambulatory Visit: Payer: Self-pay

## 2019-05-12 ENCOUNTER — Encounter (HOSPITAL_COMMUNITY): Payer: Self-pay | Admitting: Otolaryngology

## 2019-05-13 LAB — SURGICAL PATHOLOGY

## 2019-05-28 ENCOUNTER — Other Ambulatory Visit: Payer: Self-pay | Admitting: *Deleted

## 2019-05-28 DIAGNOSIS — C8191 Hodgkin lymphoma, unspecified, lymph nodes of head, face, and neck: Secondary | ICD-10-CM

## 2019-05-28 DIAGNOSIS — C8591 Non-Hodgkin lymphoma, unspecified, lymph nodes of head, face, and neck: Secondary | ICD-10-CM

## 2019-05-31 ENCOUNTER — Telehealth: Payer: Self-pay | Admitting: Physician Assistant

## 2019-05-31 NOTE — Telephone Encounter (Signed)
Received a new patient referral from Dr. Melida Quitter for NHL. Ms. Biondo has been cld and scheduled to see Cassie on on 10/7 at 930am. She's been made aware to arrive 20 minutes early.

## 2019-06-02 ENCOUNTER — Inpatient Hospital Stay: Payer: Self-pay | Attending: Physician Assistant | Admitting: Physician Assistant

## 2019-06-02 ENCOUNTER — Other Ambulatory Visit: Payer: Self-pay | Admitting: Medical Oncology

## 2019-06-02 ENCOUNTER — Telehealth: Payer: Self-pay | Admitting: Internal Medicine

## 2019-06-02 ENCOUNTER — Telehealth: Payer: Self-pay

## 2019-06-02 ENCOUNTER — Other Ambulatory Visit: Payer: Self-pay

## 2019-06-02 ENCOUNTER — Encounter: Payer: Self-pay | Admitting: Physician Assistant

## 2019-06-02 ENCOUNTER — Inpatient Hospital Stay: Payer: Self-pay

## 2019-06-02 VITALS — BP 127/73 | HR 75 | Temp 98.5°F | Resp 18 | Ht 62.0 in | Wt 184.8 lb

## 2019-06-02 DIAGNOSIS — F17210 Nicotine dependence, cigarettes, uncomplicated: Secondary | ICD-10-CM | POA: Insufficient documentation

## 2019-06-02 DIAGNOSIS — F17290 Nicotine dependence, other tobacco product, uncomplicated: Secondary | ICD-10-CM | POA: Insufficient documentation

## 2019-06-02 DIAGNOSIS — C8191 Hodgkin lymphoma, unspecified, lymph nodes of head, face, and neck: Secondary | ICD-10-CM

## 2019-06-02 DIAGNOSIS — C8111 Nodular sclerosis classical Hodgkin lymphoma, lymph nodes of head, face, and neck: Secondary | ICD-10-CM | POA: Insufficient documentation

## 2019-06-02 DIAGNOSIS — J45909 Unspecified asthma, uncomplicated: Secondary | ICD-10-CM

## 2019-06-02 DIAGNOSIS — C8190 Hodgkin lymphoma, unspecified, unspecified site: Secondary | ICD-10-CM | POA: Insufficient documentation

## 2019-06-02 DIAGNOSIS — Z807 Family history of other malignant neoplasms of lymphoid, hematopoietic and related tissues: Secondary | ICD-10-CM

## 2019-06-02 DIAGNOSIS — Z5111 Encounter for antineoplastic chemotherapy: Secondary | ICD-10-CM | POA: Insufficient documentation

## 2019-06-02 LAB — HEPATITIS PANEL, ACUTE
HCV Ab: NONREACTIVE
Hep A IgM: NONREACTIVE
Hep B C IgM: NONREACTIVE
Hepatitis B Surface Ag: NONREACTIVE

## 2019-06-02 LAB — CBC WITH DIFFERENTIAL (CANCER CENTER ONLY)
Abs Immature Granulocytes: 0.02 10*3/uL (ref 0.00–0.07)
Basophils Absolute: 0 10*3/uL (ref 0.0–0.1)
Basophils Relative: 0 %
Eosinophils Absolute: 0.4 10*3/uL (ref 0.0–0.5)
Eosinophils Relative: 4 %
HCT: 36 % (ref 36.0–46.0)
Hemoglobin: 11.9 g/dL — ABNORMAL LOW (ref 12.0–15.0)
Immature Granulocytes: 0 %
Lymphocytes Relative: 25 %
Lymphs Abs: 2.3 10*3/uL (ref 0.7–4.0)
MCH: 28.6 pg (ref 26.0–34.0)
MCHC: 33.1 g/dL (ref 30.0–36.0)
MCV: 86.5 fL (ref 80.0–100.0)
Monocytes Absolute: 0.7 10*3/uL (ref 0.1–1.0)
Monocytes Relative: 8 %
Neutro Abs: 5.6 10*3/uL (ref 1.7–7.7)
Neutrophils Relative %: 63 %
Platelet Count: 359 10*3/uL (ref 150–400)
RBC: 4.16 MIL/uL (ref 3.87–5.11)
RDW: 13.2 % (ref 11.5–15.5)
WBC Count: 9 10*3/uL (ref 4.0–10.5)
nRBC: 0 % (ref 0.0–0.2)

## 2019-06-02 LAB — CMP (CANCER CENTER ONLY)
ALT: 15 U/L (ref 0–44)
AST: 16 U/L (ref 15–41)
Albumin: 4.1 g/dL (ref 3.5–5.0)
Alkaline Phosphatase: 89 U/L (ref 38–126)
Anion gap: 9 (ref 5–15)
BUN: 12 mg/dL (ref 6–20)
CO2: 25 mmol/L (ref 22–32)
Calcium: 9.6 mg/dL (ref 8.9–10.3)
Chloride: 105 mmol/L (ref 98–111)
Creatinine: 0.67 mg/dL (ref 0.44–1.00)
GFR, Est AFR Am: >60 mL/min (ref >60)
GFR, Estimated: >60 mL/min (ref >60)
Glucose, Bld: 95 mg/dL (ref 70–99)
Potassium: 4.2 mmol/L (ref 3.5–5.1)
Sodium: 139 mmol/L (ref 135–145)
Total Bilirubin: 0.4 mg/dL (ref 0.3–1.2)
Total Protein: 7.8 g/dL (ref 6.5–8.1)

## 2019-06-02 LAB — SEDIMENTATION RATE: Sed Rate: 38 mm/hr — ABNORMAL HIGH (ref 0–22)

## 2019-06-02 LAB — URIC ACID: Uric Acid, Serum: 4.7 mg/dL (ref 2.5–7.1)

## 2019-06-02 LAB — HIV ANTIBODY (ROUTINE TESTING W REFLEX): HIV Screen 4th Generation wRfx: NONREACTIVE

## 2019-06-02 LAB — LACTATE DEHYDROGENASE: LDH: 272 U/L — ABNORMAL HIGH (ref 98–192)

## 2019-06-02 NOTE — Telephone Encounter (Signed)
Spoke with patient to schedule [facility] with [facility]/NP for 10/14 at 7:30 am.  Schedule message sent.  Spoke with Estill Bamberg in cytometry to schedule lab at 8 am.

## 2019-06-02 NOTE — Telephone Encounter (Signed)
Scheduled appt per 10/6 los - pt to get an updated schedule at [facility] appt

## 2019-06-02 NOTE — Progress Notes (Signed)
[place] [phone]   [phone]  CONSULT NOTE  REFERRING PHYSICIAN: Dr. Melida Quitter MD  REASON FOR CONSULTATION:  Hodgkin's Lymphoma  HPI Shalynn Jorstad is a 28 y.o. female without any significant past medical history except for cigar smoking and asthma.  The patient presented to the emergency room on 12/20/2018 for the chief complaint of a sore throat and a neck lump. She presented again to the emergency room on 01/22/19 due to having no resolution of her symptoms. A CT scan of the neck was performed at that time in the emergency room which showed abnormal bulky enlarged lymphadenopathy in the right supraclavicular fossa and upper mediastinum concerning for possible lymphoproliferative disorder.  The patient was then referred to ENT with Dr. Redmond Baseman who recently performed an excisional biopsy of the right cervical lymph node on 05/11/2019. The pathology of this lymph node was consistent with Hodgkin's lymphoma.  The patient was referred to our clinic today for evaluation and treatment recommendations regarding her condition.  Today, the patient is feeling well without any concerning complaints.  She denies any fever, chills, night sweats, or recent weight loss.  She endorses palpable right supraclavicular lymphadenopathy. She denies any chest pain, shortness of breath, or hemoptysis.  She reports her baseline cough and wheezing which occurs primarily at nighttime. The patient has asthma and is prescribed an inhaler for this concern. The patient denies any abdominal pain, diarrhea, constipation, nausea, vomiting, or abdominal fullness.  She denies any recent infections.  She denies any bleeding or bruising including epistaxis, gingival bleeding, hematuria, hematochezia, or hematuria. The patient states that she has been having some "red spots" on her skin that have appeared after itching the last few weeks but does not have any at this time. She also reports  numbness of both of her big toes which has been present for some time.   Her family history significant for a grandmother who also was diagnosed with Hodgkin's lymphoma in her 43s.  The patient's mother's past medical history significant for diabetes, hypertension, cardiomyopathy, and kidney disease.  The patient's father is negative for any known medical problems. The patient works at [employer].  She is single and does not have any children.  The patient has a history of cigarette smoking for which she smoked approximately 2 packs a day for about 5-6 years.  She quit in 2018 and started smoking cigars. She states that she smokes approximately 5-8 cigars daily.  She denies any current or prior history of drug abuse.  She denies any alcohol use.  HPI  Past Medical History:  Diagnosis Date   Asthma     Past Surgical History:  Procedure Laterality Date   MASS BIOPSY Right 05/11/2019   Procedure: EXCISIONAL BIOPSY OF RIGHT CERVICAL LYMPH NODE;  Surgeon: Melida Quitter, MD;  Location: [facility];  Service: ENT;  Laterality: Right;   TONSILLECTOMY      History reviewed. No pertinent family history.  Social History Social History   Tobacco Use   Smoking status: Current Every Day Smoker    Packs/day: 1.00    Types: Cigars   Smokeless tobacco: Never Used   Tobacco comment: former cigarette smoker  Substance Use Topics   Alcohol use: Yes    Comment: rare on special occasions   Drug use: No    Allergies  Allergen Reactions   Shellfish Allergy Other (See Comments)    Mouth burns and tongue numb   Amoxicillin Rash  Penicillins Rash    Has patient had a PCN reaction causing immediate rash, facial/tongue/throat swelling, SOB or lightheadedness with hypotension: rash Has patient had a PCN reaction causing severe rash involving mucus membranes or skin necrosis: No Has patient had a PCN reaction that required hospitalization: No Has patient had a PCN reaction occurring within the last  10 years: No If all of the above answers are "NO", then may proceed with Cephalosporin use.    Current Outpatient Medications  Medication Sig Dispense Refill   albuterol (VENTOLIN HFA) 108 (90 Base) MCG/ACT inhaler Inhale 1-2 puffs into the lungs every 6 (six) hours as needed for wheezing or shortness of breath.     aspirin-acetaminophen-caffeine (EXCEDRIN MIGRAINE) 250-250-65 MG tablet Take 2 tablets by mouth every 6 (six) hours as needed for headache.     No current facility-administered medications for this visit.     Review of Systems Review of Systems  Constitutional: Negative for fatigue, appetite change, chills, fever and unexpected weight change.  HENT: Negative for mouth sores, nosebleeds, sore throat and trouble swallowing.  Eyes: Negative for eye problems and icterus.  Respiratory:Positive for baseline cough and wheezing espectially at night. Negative for shortness of breath, hemoptysis, and wheezing.  Cardiovascular:Negative for chest pain or leg swelling.  Gastrointestinal:Negative for diarrhea, nausea, constipation, and vomiting.  Genitourinary: Negative for bladder incontinence, difficulty urinating, dysuria, frequency and hematuria.  Musculoskeletal: Negative for back pain, gait problem, neck pain and neck stiffness.  Skin: Positive for occasional "red spots on skin" (none present at this time). Negative for itching and rash.  Neurological: Negative for dizziness, extremity weakness, gait problem, headaches, light-headedness and seizures.  Hematological: Negative for adenopathy. Does not bruise/bleed easily.  Psychiatric/Behavioral: Negative for confusion, depression and sleep disturbance. The patient is not nervous/anxious.  Physical Exam Physical Exam Constitutional: Oriented to person, place, and time andthin appearing maleand in no distress.  HENT:  Head: Normocephalic and atraumatic.  Mouth/Throat: Oropharynx is clear and moist. No oropharyngeal  exudate.  Eyes: Conjunctivae are normal. Right eye exhibits no discharge. Left eye exhibits no discharge. No scleral icterus.  Neck: Normal range of motion. Neck supple.  Cardiovascular: Normal rate, regular rhythm, normal heart sounds and intact distal pulses.  Pulmonary/Chest: Effort normal and breath sounds normal. No respiratory distress. No wheezes. No rales.  Abdominal: Soft and non-tender.Exhibits no distension and no mass.  Musculoskeletal: Normal range of motion. Exhibits no edema.  Lymphadenopathy:  Palpable right cervical and supraclavicular lymphadenopathy Neurological: Alert and oriented to person, place, and time. Exhibits normal muscle tone. Gait normal. Coordination normal.  Skin: Skin is warm and dry. No rash noted. Not diaphoretic. No erythema. No pallor.  Psychiatric: Mood, memory and judgment normal.  Vitals reviewed.  PERFORMANCE STATUS: ECOG 0  LABORATORY DATA: Lab Results  Component Value Date   WBC 9.0 06/02/2019   HGB 11.9 (L) 06/02/2019   HCT 36.0 06/02/2019   MCV 86.5 06/02/2019   PLT 359 06/02/2019      Chemistry      Component Value Date/Time   NA 139 06/02/2019 1042   K 4.2 06/02/2019 1042   CL 105 06/02/2019 1042   CO2 25 06/02/2019 1042   BUN 12 06/02/2019 1042   CREATININE 0.67 06/02/2019 1042      Component Value Date/Time   CALCIUM 9.6 06/02/2019 1042   ALKPHOS 89 06/02/2019 1042   AST 16 06/02/2019 1042   ALT 15 06/02/2019 1042   BILITOT 0.4 06/02/2019 1042  RADIOGRAPHIC STUDIES: No results found.  ASSESSMENT: This is a very pleasant 28 year old African-American female recently diagnosed with classical Hodgkin's lymphoma, nodular sclerosing subtype.  She was diagnosed in September 2020.   PLAN: The patient was seen with Dr. Julien Nordmann today.  Dr. Julien Nordmann had a lengthy discussion with the patient today about her current condition and treatment options.  Dr. Julien Nordmann recommends that the patient undergo further staging of her  disease.   I will arrange for the patient to have a PET scan performed. I will also arrange for the patient to have a bone marrow biopsy and aspirate performed to rule out any bone marrow involvement.  We will arrange for the patient to have a 2D echocardiogram to evaluate for her cardiac function as well as pulmonary function test for her pulmonary function and lung capacity before starting chemotherapy.  We have ordered several studies today including a CBC, CMP, LDH, uric acid, hepatitis panel, HIV testing, Epstein-Barr serology, beta-2 microglobulin and ESR.  We will see the patient back for follow-up visit in 2 weeks for evaluation and a more detailed discussion about systemic chemotherapy options after completing the staging work-up and blood work.  The patient noted some concerns regarding the financial burden of treatment. I have placed a referral for social work to reach out to the patient to further explore further psychosocial needs. I also have reached out to the financial resource specialist who will be in touch with the patient once her care plan and treatment has been established.   I spent some time counseling the patient on the importance of smoking cessation. She is interested in smoking but has not committed to quitting just yet. She was given information regarding smoking cessation to read. She states she may be interested in a formal smoking cessation counseling session in the near future.    The patient voices understanding of current disease status and treatment options and is in agreement with the current care plan.  All questions were answered. The patient knows to call the clinic with any problems, questions or concerns. We can certainly see the patient much sooner if necessary.  Thank you so much for allowing me to participate in the care of [facility]. I will continue to follow up the patient with you and assist in her care.  I spent 40 minutes counseling the patient  face to face. The total time spent in the appointment was 60 minutes.  Disclaimer: This note was dictated with voice recognition software. Similar sounding words can inadvertently be transcribed and may not be corrected upon review.   Kynesha Guerin L Lafawn Lenoir June 02, 2019, 1:03 PM  ADDENDUM: Hematology/Oncology Attending: I had a face-to-face encounter with the patient today.  I recommended her care plan.  This is a very pleasant 28 years old African-American female who was recently diagnosed with Hodgkin lymphoma, classical nodular sclerosing subtype presented with right neck lymphadenopathy. I had a lengthy discussion with the patient today about her current condition and further investigation for restaging work-up as well as bleed treatment planning. I recommended for the patient to have a PET scan performed next week. I will also arrange for the patient to have pulmonary function test as well as 2D echo for evaluation of her lung and cardiac function before treatment. We will order several studies including hepatitis panel, ESR, HIV, EBV, CBC, comprehensive metabolic panel, uric acid as well as LDH today. We will arrange for the patient to have a bone marrow biopsy and aspirate to  rule out any spread to her bone marrow. The patient will come back for follow-up visit in less than 2 weeks for evaluation and more detailed discussion of her treatment options based on the final staging work-up. She was advised to call immediately if she has any concerning symptoms in the interval.  Disclaimer: This note was dictated with voice recognition software. Similar sounding words can inadvertently be transcribed and may be missed upon review. Eilleen Kempf, MD 06/02/19

## 2019-06-02 NOTE — Patient Instructions (Addendum)
-The biopsy is consistent with a type of lymphoma called Hodgkin's Lymphoma.  -We did a bunch of lab work on you today which we will discuss next time once all the results are back.  -We still need a few studies performed. We need to send you to get your lung tested to check their function before you start chemotherapy. It is so we have a baseline function of your lungs -We also need a PET scan. A PET scan lets [place] see what other areas may be involved with the cancer -We will arrange for a bone marrow biopsy just to make sure that the bone marrow is not involved. A nurse practitioner named Mendel Ryder, who is our colleague here at the cancer center is excellent and will performed the bone marrow biopsy. I will come to that morning to help.  -We need to check your heart to see where it is functioning before your start chemo so we have something to compare it to when we check again after chemotherapy -We will see you in two weeks and discuss in more details the treatment. We are anticipating giving you a treatment called ABVD (Doxorubicin, Bleomycin, and Vinblastine, and Dacarbazine) depending on all the imaging studies and labs.  -I will reach out to the financial aid group here at the cancer center. They will be in touch with you.     Hodgkin Lymphoma, Adult  Hodgkin lymphoma, also called Hodgkin disease, is a cancer that affects the lymphatic system. The lymphatic system is part of the body's defense system (immune system), which protects the body from infections, germs, and diseases. Hodgkin lymphoma often affects white blood cells and the lymph nodes. Hodgkin lymphoma can spread from lymph node to lymph node and to areas of the body where there is lymph tissue, including to the center of the bones (bone marrow). Advanced Hodgkin lymphoma can spread into blood vessels and be carried almost anywhere in the body. There are two types of Hodgkin lymphoma:  Classical Hodgkin lymphoma.  Nodular  lymphocyte-predominant Hodgkin lymphoma (NLPHL). What are the causes? The cause is not known. What increases the risk? You may be more likely to develop Hodgkin lymphoma if:  You are female.  You have been infected with the virus that causes mononucleosis (Epstein-Barr virus).  You have a brother or sister with Hodgkin lymphoma.  You have a weakened immune system.  You have HIV.  You have a personal or family history of autoimmune conditions such as rheumatoid arthritis, systemic lupus erythematosus (SLE), or sarcoidosis. What are the signs or symptoms? The first sign of Hodgkin lymphoma is often a painless swelling in a lymph node. The swelling may be felt in the neck, under the arm, or in the groin. Other symptoms include:  Fever.  Drenching night sweats.  Feeling tired all the time.  Cough.  Shortness of breath.  Itchy skin.  Loss of appetite.  Weight loss.  Pain in the lymph nodes after drinking alcohol. How is this diagnosed? This condition may be diagnosed based on your medical history and symptoms, a physical exam, and a procedure in which a tissue sample is removed from a lymph node and then examined under a microscope (biopsy). You may also have other tests to find out how advanced the cancer is and whether it has spread. This is called staging. These tests may include:  Blood tests.  Imaging tests, such as: ? A chest X-ray. ? A CT scan. ? An MRI. ? A PET scan.  A bone marrow biopsy to see if the disease has spread to the bone marrow. How is this treated? Your treatment will depend on the type of Hodgkin lymphoma you have, the stage of your cancer, your age, and your overall health. Treatment usually starts with one of the following:  Chemotherapy. Chemotherapy is the use of medicines to stop or slow the growth of cancer cells.  Radiation therapy. Radiation therapy is the use of high-energy X-rays to kill cancer cells.  A combination of chemotherapy and  radiation therapy. If chemotherapy and radiation therapy are not completely successful, you may have treatment with:  Targeted therapy. This treatment targets specific parts of cancer cells and the area around them to block the growth and spread of cancer.  Very high doses of chemotherapy followed by a stem cell transplant. Stem cells are cells that help your body develop new healthy blood cells after chemotherapy. Follow these instructions at home:  Eating and drinking  Do not take dietary supplements or herbal medicines unless your health care provider tells you to take them. Some supplements can interfere with how well the treatment works.  Try to eat regular, healthy meals. Some of your treatments might affect your appetite. Lifestyle   Make sure you are getting enough sleep on a regular basis. Most adults need 6-8 hours of sleep each night. During treatment, you may need more sleep.  Consider joining a cancer support group. Ask your health care provider for more information about local and online support groups.  Do not use any products that contain nicotine or tobacco, such as cigarettes, e-cigarettes, and chewing tobacco. If you need help quitting, ask your health care provider. General instructions  Take over-the-counter and prescription medicines only as directed by your health care provider.  Keep all follow-up visits as told by your health care provider. This is important during and after treatment.  Cancer treatment may increase your risk for other cancers and infections. After treatment: ? Make sure you get all vaccinations as recommended by your health care provider. ? Have regular cancer screenings as recommended by your health care provider. Where to find more information  American Cancer Society (ACS): www.cancer.org  Leukemia and Lymphoma Society (LLS): PreviewPal.pl  National Cancer Institute ([place]): www.cancer.gov Contact a health care provider if:  You have a  fever or other signs of infection.  You develop new symptoms or your old symptoms come back. Get help right away if:  You have chest pain.  You have trouble breathing. Summary  Hodgkin lymphoma, also called Hodgkin disease, is a cancer that affects the lymphatic system.  The first sign of Hodgkin lymphoma is often a painless swelling in a lymph node. The swelling may be felt in the neck, under the arm, or in the groin.  Your treatment will depend on the type of cancer cells you have, the stage of your cancer, your age, and your overall health. Treatment may include a combination of chemotherapy and radiation therapy.  Keep all follow-up visits as told by your health care provider. This is important during and after treatment. This information is not intended to replace advice given to you by your health care provider. Make sure you discuss any questions you have with your health care provider. Document Released: 11/19/2007 Document Revised: 05/28/2018 Document Reviewed: 05/28/2018 Elsevier Patient Education  2020 [employer].  Smoking Tobacco Information, Adult Smoking tobacco can be harmful to your health. Tobacco contains a poisonous (toxic), colorless chemical called nicotine. Nicotine is addictive.  It changes the brain and can make it hard to stop smoking. Tobacco also has other toxic chemicals that can hurt your body and raise your risk of many cancers. How can smoking tobacco affect me? Smoking tobacco puts you at risk for:  Cancer. Smoking is most commonly associated with lung cancer, but can also lead to cancer in other parts of the body.  Chronic obstructive pulmonary disease (COPD). This is a long-term lung condition that makes it hard to breathe. It also gets worse over time.  High blood pressure (hypertension), heart disease, stroke, or heart attack.  Lung infections, such as pneumonia.  Cataracts. This is when the lenses in the eyes become clouded.  Digestive  problems. This may include peptic ulcers, heartburn, and gastroesophageal reflux disease (GERD).  Oral health problems, such as gum disease and tooth loss.  Loss of taste and smell. Smoking can affect your appearance by causing:  Wrinkles.  Yellow or stained teeth, fingers, and fingernails. Smoking tobacco can also affect your social life, because:  It may be challenging to find places to smoke when away from home. Many workplaces, [employer], hotels, and public places are tobacco-free.  Smoking is expensive. This is due to the cost of tobacco and the long-term costs of treating health problems from smoking.  Secondhand smoke may affect those around you. Secondhand smoke can cause lung cancer, breathing problems, and heart disease. Children of smokers have a higher risk for: ? Sudden infant death syndrome (SIDS). ? Ear infections. ? Lung infections. If you currently smoke tobacco, quitting now can help you:  Lead a longer and healthier life.  Look, smell, breathe, and feel better over time.  Save money.  Protect others from the harms of secondhand smoke. What actions can I take to prevent health problems? Quit smoking   Do not start smoking. Quit if you already do.  Make a plan to quit smoking and commit to it. Look for programs to help you and ask your health care provider for recommendations and ideas.  Set a date and write down all the reasons you want to quit.  Let your friends and family know you are quitting so they can help and support you. Consider finding friends who also want to quit. It can be easier to quit with someone else, so that you can support each other.  Talk with your health care provider about using nicotine replacement medicines to help you quit, such as gum, lozenges, patches, sprays, or pills.  Do not replace cigarette smoking with electronic cigarettes, which are commonly called e-cigarettes. The safety of e-cigarettes is not known, and some may  contain harmful chemicals.  If you try to quit but return to smoking, stay positive. It is common to slip up when you first quit, so take it one day at a time.  Be prepared for cravings. When you feel the urge to smoke, chew gum or suck on hard candy. Lifestyle  Stay busy and take care of your body.  Drink enough fluid to keep your urine pale yellow.  Get plenty of exercise and eat a healthy diet. This can help prevent weight gain after quitting.  Monitor your eating habits. Quitting smoking can cause you to have a larger appetite than when you smoke.  Find ways to relax. Go out with friends or family to a movie or a restaurant where people do not smoke.  Ask your health care provider about having regular tests (screenings) to check for cancer. This may include  blood tests, imaging tests, and other tests.  Find ways to manage your stress, such as meditation, yoga, or exercise. Where to find support To get support to quit smoking, consider:  Asking your health care provider for more information and resources.  Taking classes to learn more about quitting smoking.  Looking for local organizations that offer resources about quitting smoking.  Joining a support group for people who want to quit smoking in your local community.  Calling the smokefree.gov counselor helpline: 1-800-Quit-Now 603-629-4174) Where to find more information You may find more information about quitting smoking from:  HelpGuide.org: www.helpguide.org  https://hall.com/: smokefree.gov  American Lung Association: www.lung.org Contact a health care provider if you:  Have problems breathing.  Notice that your lips, nose, or fingers turn blue.  Have chest pain.  Are coughing up blood.  Feel faint or you pass out.  Have other health changes that cause you to worry. Summary  Smoking tobacco can negatively affect your health, the health of those around you, your finances, and your social life.  Do not  start smoking. Quit if you already do. If you need help quitting, ask your health care provider.  Think about joining a support group for people who want to quit smoking in your local community. There are many effective programs that will help you to quit this behavior. This information is not intended to replace advice given to you by your health care provider. Make sure you discuss any questions you have with your health care provider. Document Released: 08/27/2016 Document Revised: 10/01/2017 Document Reviewed: 08/27/2016 Elsevier Patient Education  2020 [employer].

## 2019-06-03 LAB — BETA 2 MICROGLOBULIN, SERUM: Beta-2 Microglobulin: 1.4 mg/L (ref 0.6–2.4)

## 2019-06-04 ENCOUNTER — Other Ambulatory Visit: Payer: Self-pay | Admitting: Physician Assistant

## 2019-06-04 ENCOUNTER — Telehealth: Payer: Self-pay | Admitting: Physician Assistant

## 2019-06-04 ENCOUNTER — Telehealth: Payer: Self-pay | Admitting: *Deleted

## 2019-06-04 DIAGNOSIS — C8111 Nodular sclerosis classical Hodgkin lymphoma, lymph nodes of head, face, and neck: Secondary | ICD-10-CM

## 2019-06-04 MED ORDER — LIDOCAINE-PRILOCAINE 2.5-2.5 % EX CREA: 1.0000 "application " | TOPICAL_CREAM | CUTANEOUS | 0 refills | Status: DC | PRN

## 2019-06-04 NOTE — Telephone Encounter (Signed)
Called pt regarding BMBX, pt advised she wants to be put to sleep as she is "scared to death and my BP has been elevated the last 2 days." Explained to pt I will s/w MD/Cassie, PA and advise above information. Instructed pt she may receive a call from scheduling with a new appt date/time since the procedure will be done in IR. No further concerns. Message forward to provider

## 2019-06-04 NOTE — Telephone Encounter (Signed)
Spoke to the patient and informed her that I put in a referral to interventional radiology for a port-a-cath placement. I also sent in a prescription for EMLA cream to the patient's pharacy.

## 2019-06-05 LAB — EPSTEIN BARR VRS(EBV DNA BY PCR)
EBV DNA QN by PCR: NEGATIVE copies/mL
log10 EBV DNA Qn PCR: UNDETERMINED log10 copy/mL

## 2019-06-07 ENCOUNTER — Telehealth: Payer: Self-pay | Admitting: *Deleted

## 2019-06-07 NOTE — Telephone Encounter (Signed)
[place] Work  Clinical Social Work was referred by medical oncology PA for assessment of psychosocial needs.  Clinical Social Worker contacted patient by phone  to offer support and assess for needs.  Patient shared she was working full-time and hours have been cut due to multiple medical appointments.  Patient is in need of financial resources.  CSW will explore community resources and patient plans to meet with financial resource specialist.  CSW explored patient's coping skills, strengths, and external support.  CSW scheduled counseling session with patient following medical oncologist appointment next week.     Gwinda Maine, LCSW  Clinical Social Worker [facility]

## 2019-06-08 ENCOUNTER — Other Ambulatory Visit: Payer: Self-pay | Admitting: Physician Assistant

## 2019-06-08 DIAGNOSIS — C8111 Nodular sclerosis classical Hodgkin lymphoma, lymph nodes of head, face, and neck: Secondary | ICD-10-CM

## 2019-06-09 ENCOUNTER — Other Ambulatory Visit: Payer: Self-pay | Admitting: Medical Oncology

## 2019-06-09 ENCOUNTER — Telehealth: Payer: Self-pay | Admitting: Medical Oncology

## 2019-06-09 NOTE — Telephone Encounter (Signed)
Concerned her cancer is growing and asking to move up appts so she can start treatment.  I told her she needs to complete the scheduled tests before starting treatment and she will get her treatment soon.  I alleviated her fears and she appreciated the information.

## 2019-06-10 ENCOUNTER — Inpatient Hospital Stay: Payer: Self-pay

## 2019-06-11 ENCOUNTER — Other Ambulatory Visit: Payer: Self-pay

## 2019-06-11 ENCOUNTER — Ambulatory Visit (HOSPITAL_COMMUNITY)
Admission: RE | Admit: 2019-06-11 | Discharge: 2019-06-11 | Disposition: A | Discharge status: Home / Self Care | Payer: Self-pay | Source: Ambulatory Visit | Attending: Physician Assistant | Admitting: Physician Assistant

## 2019-06-11 ENCOUNTER — Other Ambulatory Visit (HOSPITAL_COMMUNITY)
Admission: RE | Admit: 2019-06-11 | Discharge: 2019-06-11 | Disposition: A | Discharge status: Home / Self Care | Payer: Self-pay | Source: Ambulatory Visit | Attending: Physician Assistant | Admitting: Physician Assistant

## 2019-06-11 DIAGNOSIS — C8191 Hodgkin lymphoma, unspecified, lymph nodes of head, face, and neck: Secondary | ICD-10-CM | POA: Insufficient documentation

## 2019-06-11 DIAGNOSIS — Z20828 Contact with and (suspected) exposure to other viral communicable diseases: Secondary | ICD-10-CM | POA: Insufficient documentation

## 2019-06-11 LAB — GLUCOSE, CAPILLARY: Glucose-Capillary: 102 mg/dL — ABNORMAL HIGH (ref 70–99)

## 2019-06-11 MED ORDER — FLUDEOXYGLUCOSE F - 18 (FDG) INJECTION
9.0700 | Freq: Once | INTRAVENOUS | Status: AC | PRN
  Administered 2019-06-11: 9.07 via INTRAVENOUS

## 2019-06-12 LAB — NOVEL CORONAVIRUS, NAA (HOSP ORDER, SEND-OUT TO REF LAB; TAT 18-24 HRS): SARS-CoV-2, NAA: NOT DETECTED

## 2019-06-14 ENCOUNTER — Other Ambulatory Visit: Payer: Self-pay

## 2019-06-14 ENCOUNTER — Ambulatory Visit (HOSPITAL_BASED_OUTPATIENT_CLINIC_OR_DEPARTMENT_OTHER)
Admission: RE | Admit: 2019-06-14 | Discharge: 2019-06-14 | Disposition: A | Discharge status: Home / Self Care | Payer: Self-pay | Source: Ambulatory Visit | Attending: Physician Assistant | Admitting: Physician Assistant

## 2019-06-14 ENCOUNTER — Ambulatory Visit (HOSPITAL_COMMUNITY)
Admission: RE | Admit: 2019-06-14 | Discharge: 2019-06-14 | Disposition: A | Discharge status: Home / Self Care | Payer: Self-pay | Source: Ambulatory Visit | Attending: Physician Assistant | Admitting: Physician Assistant

## 2019-06-14 DIAGNOSIS — C8191 Hodgkin lymphoma, unspecified, lymph nodes of head, face, and neck: Secondary | ICD-10-CM | POA: Insufficient documentation

## 2019-06-14 LAB — PULMONARY FUNCTION TEST
DL/VA % pred: 114 %
DL/VA: 5.43 ml/min/mmHg/L
DLCO cor % pred: 77 %
DLCO cor: 16.17 ml/min/mmHg
DLCO unc % pred: 73 %
DLCO unc: 15.37 ml/min/mmHg
FEF 25-75 Post: 1.62 L/sec
FEF 25-75 Pre: 1.78 L/sec
FEF2575-%Change-Post: -8 %
FEF2575-%Pred-Post: 50 %
FEF2575-%Pred-Pre: 55 %
FEV1-%Change-Post: -3 %
FEV1-%Pred-Post: 66 %
FEV1-%Pred-Pre: 69 %
FEV1-Post: 1.76 L
FEV1-Pre: 1.82 L
FEV1FVC-%Change-Post: -6 %
FEV1FVC-%Pred-Pre: 94 %
FEV6-%Change-Post: 2 %
FEV6-%Pred-Post: 76 %
FEV6-%Pred-Pre: 74 %
FEV6-Post: 2.3 L
FEV6-Pre: 2.24 L
FEV6FVC-%Pred-Post: 100 %
FEV6FVC-%Pred-Pre: 100 %
FVC-%Change-Post: 2 %
FVC-%Pred-Post: 75 %
FVC-%Pred-Pre: 73 %
FVC-Post: 2.3 L
FVC-Pre: 2.24 L
Post FEV1/FVC ratio: 77 %
Post FEV6/FVC ratio: 100 %
Pre FEV1/FVC ratio: 81 %
Pre FEV6/FVC Ratio: 100 %
RV % pred: 141 %
RV: 1.7 L
TLC % pred: 82 %
TLC: 3.92 L

## 2019-06-14 MED ORDER — ALBUTEROL SULFATE (2.5 MG/3ML) 0.083% IN NEBU
2.5000 mg | INHALATION_SOLUTION | Freq: Once | RESPIRATORY_TRACT | Status: AC
  Administered 2019-06-14: 2.5 mg via RESPIRATORY_TRACT

## 2019-06-14 NOTE — Progress Notes (Signed)
*  PRELIMINARY RESULTS* Echocardiogram 2D Echocardiogram has been performed.  [name] 06/14/2019, 11:34 AM

## 2019-06-16 ENCOUNTER — Inpatient Hospital Stay: Payer: Self-pay | Admitting: *Deleted

## 2019-06-16 ENCOUNTER — Inpatient Hospital Stay (HOSPITAL_BASED_OUTPATIENT_CLINIC_OR_DEPARTMENT_OTHER): Payer: Self-pay | Admitting: Internal Medicine

## 2019-06-16 ENCOUNTER — Other Ambulatory Visit: Payer: Self-pay

## 2019-06-16 ENCOUNTER — Encounter: Payer: Self-pay | Admitting: Internal Medicine

## 2019-06-16 VITALS — BP 118/74 | HR 83 | Temp 98.5°F | Resp 16 | Ht 62.0 in | Wt 183.7 lb

## 2019-06-16 DIAGNOSIS — C8111 Nodular sclerosis classical Hodgkin lymphoma, lymph nodes of head, face, and neck: Secondary | ICD-10-CM

## 2019-06-16 DIAGNOSIS — Z7189 Other specified counseling: Secondary | ICD-10-CM

## 2019-06-16 DIAGNOSIS — Z5111 Encounter for antineoplastic chemotherapy: Secondary | ICD-10-CM

## 2019-06-16 MED ORDER — ALLOPURINOL 100 MG PO TABS: 100.0000 mg | ORAL_TABLET | Freq: Every day | ORAL | 2 refills | Status: DC

## 2019-06-16 MED ORDER — PROCHLORPERAZINE MALEATE 10 MG PO TABS: 10.0000 mg | ORAL_TABLET | Freq: Four times a day (QID) | ORAL | 0 refills | Status: DC | PRN

## 2019-06-16 NOTE — Progress Notes (Signed)
START ON PATHWAY REGIMEN - Lymphoma and CLL     A cycle is every 28 days:     Doxorubicin      Dacarbazine      Vinblastine      Bleomycin   **Always confirm dose/schedule in your pharmacy ordering system**  Patient Characteristics: Classical Hodgkin Lymphoma, First Line, Stage I / II, Early Unfavorable with  Risk Factors Other  Than Bulky Mediastinal Disease, Age < 60 Disease Type: Not Applicable Disease Type: Not Applicable Disease Type: Classical Hodgkin Lymphoma Line of therapy: First Line [place] Stage: IIA First Line, Stage I/II Disease Characteristics: Early Unfavorable with Risk Factors Other Than Bulky Mediastinal Disease Age: < 60 Intent of Therapy: Curative Intent, Discussed with Patient 

## 2019-06-16 NOTE — Progress Notes (Signed)
Met with patient to introduce myself as Arboriculturist and to offer available resources.  Discussed one-time $42 Engineer, drilling to assist with personal expenses while going through treatment. Patient was able to provide income information via email.   Approved for one-time grant. She has a copy of the approval letter as well as the expense sheet along with the Outpatient pharmacy information.  Gave her my card for any additional financial questions or concerns.

## 2019-06-16 NOTE — Progress Notes (Signed)
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 ° °PRIOR THERAPY: None ° °CURRENT THERAPY: Systemic chemotherapy with ABVD on days 1 and 15 every 4 weeks.  First dose June 23, 2019. ° °INTERVAL HISTORY: °Faith Pope 28 y.o. female returns to the clinic today for follow-up visit.  The patient is feeling fine today with no concerning complaints except for pain on the right neck area and occasional shortness of breath with exertion and fatigue.  She denied having any current chest pain, cough or hemoptysis.  She denied having any recent weight loss or night sweats.  She has no nausea, vomiting, diarrhea or constipation.  She has no headache or visual changes.  She had a PET scan performed recently for evaluation of her disease and that showed the right cervical and supraclavicular lymphadenopathy in addition to large mediastinal lymphadenopathy.  The patient is scheduled to have bone marrow biopsy and aspirate next week.  She is also scheduled for a Port-A-Cath placement next week.  She is here today for evaluation and discussion of her treatment options. ° °MEDICAL HISTORY: °Past Medical History:  °Diagnosis Date  °• Asthma   ° ° °ALLERGIES:  is allergic to shellfish allergy; amoxicillin; and penicillins. ° °MEDICATIONS:  °Current Outpatient Medications  °Medication Sig Dispense Refill  °• albuterol (VENTOLIN HFA) 108 (90 Base) MCG/ACT inhaler Inhale 1-2 puffs into the lungs every 6 (six) hours as needed for wheezing or shortness of breath.    °• aspirin-acetaminophen-caffeine (EXCEDRIN MIGRAINE) 250-250-65 MG tablet Take 2 tablets by mouth every 6 (six) hours as needed for headache.    °• lidocaine-prilocaine (EMLA) cream Apply 1 application topically as needed. 30 g 0  ° °No current facility-administered medications for  this visit.   ° ° °SURGICAL HISTORY:  °Past Surgical History:  °Procedure Laterality Date  °• MASS BIOPSY Right 05/11/2019  ° Procedure: EXCISIONAL BIOPSY OF RIGHT CERVICAL LYMPH NODE;  Surgeon: Bates, Dwight, MD;  Location: [facility] OR;  Service: ENT;  Laterality: Right;  °• TONSILLECTOMY    ° ° °REVIEW OF SYSTEMS:  Constitutional: positive for fatigue °Eyes: negative °Ears, nose, mouth, throat, and face: negative °Respiratory: negative °Cardiovascular: negative °Gastrointestinal: negative °Genitourinary:negative °Integument/breast: negative °Hematologic/lymphatic: negative °Musculoskeletal:positive for neck pain °Neurological: negative °Behavioral/Psych: negative °Endocrine: negative °Allergic/Immunologic: negative  ° °PHYSICAL EXAMINATION: General appearance: alert, cooperative and no distress °Head: Normocephalic, without obvious abnormality, atraumatic °Neck: moderate anterior cervical adenopathy, no carotid bruit, no JVD, supple, symmetrical, trachea midline and thyroid not enlarged, symmetric, no tenderness/mass/nodules °Lymph nodes: Cervical adenopathy: Moderate °Resp: clear to auscultation bilaterally and normal percussion bilaterally °Back: negative, no kyphosis present, symmetric, no curvature. ROM normal. No CVA tenderness. °Cardio: regular rate and rhythm, S1, S2 normal, no murmur, click, rub or gallop °GI: soft, non-tender; bowel sounds normal; no masses,  no organomegaly °Genitalia: defer exam °Extremities: extremities normal, atraumatic, no cyanosis or edema °Neurologic: Alert and oriented X 3, normal strength and tone. Normal symmetric reflexes. Normal coordination and gait ° °ECOG PERFORMANCE STATUS: 1 - Symptomatic but completely ambulatory ° °Blood pressure 118/74, pulse 83, temperature 98.5 °F (36.9 °C), temperature source Temporal, resp. rate 16, height 5' 2" (1.575 m), weight 183 lb 11.2 oz (83.3 kg), SpO2 100 %. ° °LABORATORY DATA: °Lab Results  °Component Value Date  ° WBC 9.0 06/02/2019  ° HGB  11.9 (  11.9 (L) 06/02/2019   HCT 36.0 06/02/2019   MCV 86.5 06/02/2019   PLT 359 06/02/2019      Chemistry      Component Value Date/Time   NA 139 06/02/2019 1042   K 4.2 06/02/2019 1042   CL 105 06/02/2019 1042   CO2 25 06/02/2019 1042   BUN 12 06/02/2019 1042   CREATININE 0.67 06/02/2019 1042      Component Value Date/Time   CALCIUM 9.6 06/02/2019 1042   ALKPHOS 89 06/02/2019 1042   AST 16 06/02/2019 1042   ALT 15 06/02/2019 1042   BILITOT 0.4 06/02/2019 1042       RADIOGRAPHIC STUDIES: Nm Pet Image Initial (pi) Skull Base To Thigh  Result Date: 06/11/2019 CLINICAL DATA:  Initial treatment strategy for staging of Hodgkin's lymphoma. EXAM: NUCLEAR MEDICINE PET SKULL BASE TO THIGH TECHNIQUE: 9.1 mCi F-18 FDG was injected intravenously. Full-ring PET imaging was performed from the skull base to thigh after the radiotracer. CT data was obtained and used for attenuation correction and anatomic localization. Fasting blood glucose: 102 mg/dl COMPARISON:  Neck CT 01/22/2019.  Chest radiograph 02/12/2017. FINDINGS: Mediastinal blood pool activity: SUV max 1.8 Liver activity: SUV max 3.1 NECK: Right supraclavicular hypermetabolic nodes. Example at 1.1 cm and a S.U.V. max of 7.3 on 33/4. There is also a least 1 small hypermetabolic low right jugular node. Incidental CT findings: No other cervical adenopathy. CHEST: Right subpectoral hypermetabolic nodes, including at 9 mm and a S.U.V. max of 7.2 on 44/4. Nodal conglomerate within the mediastinum, with a subcarinal component measuring 2.5 x 4.5 cm and a S.U.V. max of 15.5 on 59/4. Right cardiophrenic angle mass measures 5.1 x 3.5 cm and a S.U.V. max of 9.6 on 62/4. More inferior right cardiophrenic angle hypermetabolic nodes, including at 8 mm and a S.U.V. max of 4.8. Incidental CT findings: Nodules lymph nodes, including at 3 mm on 30/8. Right lower lobe scarring and likely post infectious/inflammatory focal bronchiectasis medially. Example image 47/8.  ABDOMEN/PELVIS: No abdominopelvic parenchymal or nodal hypermetabolism. Incidental CT findings: Normal adrenal glands. No ascites. Trace free pelvic fluid is likely physiologic. SKELETON: Mild diffuse marrow hypermetabolism, without focal abnormality. Incidental CT findings: none IMPRESSION: 1. Active lymphoma within the neck and chest, as detailed above. ([place]) 4. 2. Mild hypermetabolism diffusely throughout the marrow space. Indeterminate. Although this could be physiologic, marrow involvement cannot be excluded. Electronically Signed   By: Abigail Miyamoto M.D.   On: 06/11/2019 09:55    ASSESSMENT AND PLAN: This is a very pleasant 28 years old African-American female recently diagnosed with a stage IIA Hodgkin lymphoma, nodular sclerosing subtype diagnosed in October 2020 and presented with right cervical and supraclavicular lymphadenopathy as well as right mediastinal lymphadenopathy.  The patient has no evidence of disease below the diaphragm. She does not have a bulky disease and she has no significant unfavorable risk factors. She is scheduled for a bone marrow biopsy and aspirate next week in addition to Port-A-Cath placement. I had a lengthy discussion with the patient today about her condition and treatment options.  I discussed with the patient treatment with systemic chemotherapy with ABVD.  I discussed with the patient the adverse effect of this treatment including but not limited to alopecia, myelosuppression, nausea and vomiting, peripheral neuropathy, pulmonary or cardiac dysfunction. She is expected to start the first cycle of this treatment next week. She will have a chemotherapy education class before the first dose of her treatment. I will send prescription for Compazine  10 mg p.o. every 6 hours as needed for nausea in addition to allopurinol 100 mg p.o. twice daily to her pharmacy. She will come back for follow-up visit with the start of day 15 of the first cycle. I strongly  recommend for the patient to take all the precautions necessary to avoid any chance for pregnancy during her treatment.  She is currently abstaining from any sexual activity.  We will check her pregnancy test before every treatment. She was also advised in the past about infertility issues with her treatment and she declined egg preservation. The patient was advised to call immediately if she has any concerning symptoms in the interval. The patient voices understanding of current disease status and treatment options and is in agreement with the current care plan.  All questions were answered. The patient knows to call the clinic with any problems, questions or concerns. We can certainly see the patient much sooner if necessary.  I spent 15 minutes counseling the patient face to face. The total time spent in the appointment was 25 minutes.  Disclaimer: This note was dictated with voice recognition software. Similar sounding words can inadvertently be transcribed and may not be corrected upon review.

## 2019-06-16 NOTE — Progress Notes (Signed)
[facility] Clinical Social Work  Clinical Social Work met with patient in CSW office following medical oncology appointment. Patient had shared how she was "feeling low and negative" prior to today's visit, but also feels at peace with her diagnosis.  She indicated feeling scared, but hopeful after meeting with oncologist. CSW normalized feelings of anxiety and fear and discussed strategies for coping with cancer.  Patient plans to follow up with CSW as needed for emotional support.  CSW referred patient to Living Well with Uncertainty class that starts today.  Patient registered and plans to complete program.  CSW provided patient with one-time [employer] gift card to assist with food/gas.   Lauren Somers, LCSW  Clinical Social Worker [facility]         

## 2019-06-17 ENCOUNTER — Telehealth: Payer: Self-pay | Admitting: Internal Medicine

## 2019-06-17 NOTE — Telephone Encounter (Signed)
Scheduled appt per 10/21 los.  Left a detailed VM of the appt date and time.

## 2019-06-21 ENCOUNTER — Other Ambulatory Visit: Payer: Self-pay | Admitting: Radiology

## 2019-06-21 ENCOUNTER — Other Ambulatory Visit: Payer: Self-pay | Admitting: Student

## 2019-06-21 ENCOUNTER — Encounter (HOSPITAL_COMMUNITY): Payer: Self-pay | Admitting: Interventional Radiology

## 2019-06-21 ENCOUNTER — Other Ambulatory Visit: Payer: Self-pay | Admitting: Physician Assistant

## 2019-06-21 ENCOUNTER — Other Ambulatory Visit: Payer: Self-pay

## 2019-06-21 ENCOUNTER — Ambulatory Visit (HOSPITAL_COMMUNITY)
Admission: RE | Admit: 2019-06-21 | Discharge: 2019-06-21 | Disposition: A | Discharge status: Home / Self Care | Payer: Self-pay | Source: Ambulatory Visit | Attending: Physician Assistant | Admitting: Physician Assistant

## 2019-06-21 ENCOUNTER — Ambulatory Visit (HOSPITAL_COMMUNITY)
Admission: RE | Admit: 2019-06-21 | Discharge: 2019-06-21 | Disposition: A | Discharge status: Home / Self Care | Payer: Self-pay | Source: Ambulatory Visit | Attending: Internal Medicine | Admitting: Internal Medicine

## 2019-06-21 DIAGNOSIS — C8111 Nodular sclerosis classical Hodgkin lymphoma, lymph nodes of head, face, and neck: Secondary | ICD-10-CM

## 2019-06-21 DIAGNOSIS — R7989 Other specified abnormal findings of blood chemistry: Secondary | ICD-10-CM | POA: Insufficient documentation

## 2019-06-21 DIAGNOSIS — Z880 Allergy status to penicillin: Secondary | ICD-10-CM | POA: Insufficient documentation

## 2019-06-21 DIAGNOSIS — F17210 Nicotine dependence, cigarettes, uncomplicated: Secondary | ICD-10-CM | POA: Insufficient documentation

## 2019-06-21 DIAGNOSIS — Z79899 Other long term (current) drug therapy: Secondary | ICD-10-CM | POA: Insufficient documentation

## 2019-06-21 DIAGNOSIS — J45909 Unspecified asthma, uncomplicated: Secondary | ICD-10-CM | POA: Insufficient documentation

## 2019-06-21 HISTORY — PX: IR BONE MARROW BIOPSY & ASPIRATION: IMG5727

## 2019-06-21 HISTORY — PX: IR IMAGING GUIDED PORT INSERTION: IMG5740

## 2019-06-21 LAB — PROTIME-INR
INR: 1 (ref 0.8–1.2)
Prothrombin Time: 12.6 seconds (ref 11.4–15.2)

## 2019-06-21 LAB — CBC WITH DIFFERENTIAL/PLATELET
Abs Immature Granulocytes: 0.02 10*3/uL (ref 0.00–0.07)
Basophils Absolute: 0 10*3/uL (ref 0.0–0.1)
Basophils Relative: 1 %
Eosinophils Absolute: 0.4 10*3/uL (ref 0.0–0.5)
Eosinophils Relative: 6 %
HCT: 37 % (ref 36.0–46.0)
Hemoglobin: 11.9 g/dL — ABNORMAL LOW (ref 12.0–15.0)
Immature Granulocytes: 0 %
Lymphocytes Relative: 38 %
Lymphs Abs: 2.8 10*3/uL (ref 0.7–4.0)
MCH: 28.3 pg (ref 26.0–34.0)
MCHC: 32.2 g/dL (ref 30.0–36.0)
MCV: 88.1 fL (ref 80.0–100.0)
Monocytes Absolute: 0.8 10*3/uL (ref 0.1–1.0)
Monocytes Relative: 12 %
Neutro Abs: 3.1 10*3/uL (ref 1.7–7.7)
Neutrophils Relative %: 43 %
Platelets: 429 10*3/uL — ABNORMAL HIGH (ref 150–400)
RBC: 4.2 MIL/uL (ref 3.87–5.11)
RDW: 13.1 % (ref 11.5–15.5)
WBC: 7.2 10*3/uL (ref 4.0–10.5)
nRBC: 0 % (ref 0.0–0.2)

## 2019-06-21 LAB — BASIC METABOLIC PANEL
Anion gap: 10 (ref 5–15)
BUN: 14 mg/dL (ref 6–20)
CO2: 23 mmol/L (ref 22–32)
Calcium: 9.1 mg/dL (ref 8.9–10.3)
Chloride: 105 mmol/L (ref 98–111)
Creatinine, Ser: 0.73 mg/dL (ref 0.44–1.00)
GFR calc Af Amer: >60 mL/min (ref >60)
GFR calc non Af Amer: >60 mL/min (ref >60)
Glucose, Bld: 104 mg/dL — ABNORMAL HIGH (ref 70–99)
Potassium: 3.9 mmol/L (ref 3.5–5.1)
Sodium: 138 mmol/L (ref 135–145)

## 2019-06-21 MED ORDER — DIPHENHYDRAMINE HCL 50 MG/ML IJ SOLN
INTRAMUSCULAR | Status: AC | PRN
  Administered 2019-06-21: 25 mg via INTRAVENOUS

## 2019-06-21 MED ORDER — LIDOCAINE HCL (PF) 1 % IJ SOLN
INTRAMUSCULAR | Status: AC | PRN
  Administered 2019-06-21 (×3): 10 mL

## 2019-06-21 MED ORDER — MIDAZOLAM HCL 2 MG/2ML IJ SOLN
INTRAMUSCULAR | Status: AC | PRN
  Administered 2019-06-21: 2 mg via INTRAVENOUS
  Administered 2019-06-21 (×2): 1 mg via INTRAVENOUS

## 2019-06-21 MED ORDER — SODIUM CHLORIDE 0.9 % IV SOLN
INTRAVENOUS | Status: DC
  Administered 2019-06-21: 09:00:00 via INTRAVENOUS

## 2019-06-21 MED ORDER — MIDAZOLAM HCL 2 MG/2ML IJ SOLN
INTRAMUSCULAR | Status: AC
  Filled 2019-06-21: qty 2

## 2019-06-21 MED ORDER — HEPARIN SOD (PORK) LOCK FLUSH 100 UNIT/ML IV SOLN
INTRAVENOUS | Status: AC | PRN
  Administered 2019-06-21: 500 [IU] via INTRAVENOUS

## 2019-06-21 MED ORDER — HEPARIN SOD (PORK) LOCK FLUSH 100 UNIT/ML IV SOLN
INTRAVENOUS | Status: AC
  Filled 2019-06-21: qty 5

## 2019-06-21 MED ORDER — DIPHENHYDRAMINE HCL 50 MG/ML IJ SOLN
INTRAMUSCULAR | Status: AC
  Filled 2019-06-21: qty 1

## 2019-06-21 MED ORDER — CLINDAMYCIN PHOSPHATE 900 MG/50ML IV SOLN
900.0000 mg | Freq: Once | INTRAVENOUS | Status: AC
  Administered 2019-06-21: 900 mg via INTRAVENOUS

## 2019-06-21 MED ORDER — LIDOCAINE HCL 1 % IJ SOLN
INTRAMUSCULAR | Status: AC
  Filled 2019-06-21: qty 20

## 2019-06-21 MED ORDER — FENTANYL CITRATE (PF) 100 MCG/2ML IJ SOLN
INTRAMUSCULAR | Status: AC
  Filled 2019-06-21: qty 4

## 2019-06-21 MED ORDER — LIDOCAINE HCL 1 % IJ SOLN
INTRAMUSCULAR | Status: AC
  Filled 2019-06-21: qty 40

## 2019-06-21 MED ORDER — CLINDAMYCIN PHOSPHATE 900 MG/50ML IV SOLN
INTRAVENOUS | Status: AC
  Filled 2019-06-21: qty 50

## 2019-06-21 MED ORDER — FENTANYL CITRATE (PF) 100 MCG/2ML IJ SOLN
INTRAMUSCULAR | Status: AC | PRN
  Administered 2019-06-21 (×3): 50 ug via INTRAVENOUS

## 2019-06-21 MED ORDER — MIDAZOLAM HCL 2 MG/2ML IJ SOLN
INTRAMUSCULAR | Status: AC
  Filled 2019-06-21: qty 4

## 2019-06-21 NOTE — Procedures (Signed)
Interventional Radiology Procedure Note  Procedure: Placement of a right IJ approach single lumen PowerPort.  Tip is positioned at the superior cavoatrial junction and catheter is ready for immediate use.  Complications: None Recommendations:  - Ok to shower tomorrow - Do not submerge for 7 days - Routine line care   Signed,  Jerrid Forgette S. Clebert Wenger, DO   

## 2019-06-21 NOTE — Procedures (Signed)
Interventional Radiology Procedure Note  Procedure: Fluoro- guided aspirate and core biopsy of left posterior iliac bone Complications: None Recommendations: - Will proceed with port now - OTC's PRN  Pain - Follow biopsy results  Signed,  Dulcy Fanny. Earleen Newport, DO

## 2019-06-21 NOTE — Discharge Instructions (Addendum)
Leave dressing to right upper chest in place for 24 hours.  Leave bandaid to upper buttocks area for 24 hours. After 24 hours, you may remove both dressing and the bandaid.  You do not have to place another dressing over the sites.   You may shower after the dressings are removed.  Do not get them wet until the dressings have been removed.   Check both areas for signs and symptoms of infection: fever over 100.6 orally, swelling at the site, drainage from the site, redness at the site, site is hot or warm to touch.  If these occur, please call your doctor at the cancer center and alert them of the noted symptoms.   Do not use the numbing cream on your port site for at least 2 weeks.  The site needs to heal first.  The cream may cause the surgical glue to come off to early.    Implanted Port Insertion, Care After This sheet gives you information about how to care for yourself after your procedure. Your health care provider may also give you more specific instructions. If you have problems or questions, contact your health care provider. What can I expect after the procedure? After the procedure, it is common to have:  Discomfort at the port insertion site.  Bruising on the skin over the port. This should improve over 3-4 days. Follow these instructions at home: [facility] care  After your port is placed, you will get a manufacturer's information card. The card has information about your port. Keep this card with you at all times.  Take care of the port as told by your health care provider. Ask your health care provider if you or a family member can get training for taking care of the port at home. A home health care nurse may also take care of the port.  Make sure to remember what type of port you have. Incision care      Follow instructions from your health care provider about how to take care of your port insertion site. Make sure you: ? Wash your hands with soap and water before and after you  change your bandage (dressing). If soap and water are not available, use hand sanitizer. ? Change your dressing as told by your health care provider. ? Leave stitches (sutures), skin glue, or adhesive strips in place. These skin closures may need to stay in place for 2 weeks or longer. If adhesive strip edges start to loosen and curl up, you may trim the loose edges. Do not remove adhesive strips completely unless your health care provider tells you to do that.  Check your port insertion site every day for signs of infection. Check for: ? Redness, swelling, or pain. ? Fluid or blood. ? Warmth. ? Pus or a bad smell. Activity  Return to your normal activities as told by your health care provider. Ask your health care provider what activities are safe for you.  Do not lift anything that is heavier than 10 lb (4.5 kg), or the limit that you are told, until your health care provider says that it is safe. General instructions  Take over-the-counter and prescription medicines only as told by your health care provider.  Do not take baths, swim, or use a hot tub until your health care provider approves. Ask your health care provider if you may take showers. You may only be allowed to take sponge baths.  Do not drive for 24 hours if you were given  a sedative during your procedure.  Wear a medical alert bracelet in case of an emergency. This will tell any health care providers that you have a port.  Keep all follow-up visits as told by your health care provider. This is important. Contact a health care provider if:  You cannot flush your port with saline as directed, or you cannot draw blood from the port.  You have a fever or chills.  You have redness, swelling, or pain around your port insertion site.  You have fluid or blood coming from your port insertion site.  Your port insertion site feels warm to the touch.  You have pus or a bad smell coming from the port insertion site. Get help  right away if:  You have chest pain or shortness of breath.  You have bleeding from your port that you cannot control. Summary  Take care of the port as told by your health care provider. Keep the manufacturer's information card with you at all times.  Change your dressing as told by your health care provider.  Contact a health care provider if you have a fever or chills or if you have redness, swelling, or pain around your port insertion site.  Keep all follow-up visits as told by your health care provider. This information is not intended to replace advice given to you by your health care provider. Make sure you discuss any questions you have with your health care provider. Document Released: 06/02/2013 Document Revised: 03/10/2018 Document Reviewed: 03/10/2018 Elsevier Patient Education  [place].  Bone Marrow Aspiration and Bone Marrow Biopsy, Adult, Care After This sheet gives you information about how to care for yourself after your procedure. Your health care provider may also give you more specific instructions. If you have problems or questions, contact your health care provider. What can I expect after the procedure? After the procedure, it is common to have:  Mild pain and tenderness.  Swelling.  Bruising. Follow these instructions at home: Puncture site care      Follow instructions from your health care provider about how to take care of the puncture site. Make sure you: ? Wash your hands with soap and water before you change your bandage (dressing). If soap and water are not available, use hand sanitizer. ? Change your dressing as told by your health care provider.  Check your puncture siteevery day for signs of infection. Check for: ? More redness, swelling, or pain. ? More fluid or blood. ? Warmth. ? Pus or a bad smell. General instructions  Take over-the-counter and prescription medicines only as told by your health care provider.  Do not  take baths, swim, or use a hot tub until your health care provider approves. Ask if you can take a shower or have a sponge bath.  Return to your normal activities as told by your health care provider. Ask your health care provider what activities are safe for you.  Do not drive for 24 hours if you were given a medicine to help you relax (sedative) during your procedure.  Keep all follow-up visits as told by your health care provider. This is important. Contact a health care provider if:  Your pain is not controlled with medicine. Get help right away if:  You have a fever.  You have more redness, swelling, or pain around the puncture site.  You have more fluid or blood coming from the puncture site.  Your puncture site feels warm to the touch.  You  have pus or a bad smell coming from the puncture site. These symptoms may represent a serious problem that is an emergency. Do not wait to see if the symptoms will go away. Get medical help right away. Call your local emergency services (911 in the [place] Do not drive yourself to the hospital. Summary  After the procedure, it is common to have mild pain, tenderness, swelling, and bruising.  Follow instructions from your health care provider about how to take care of the puncture site.  Get help right away if you have any symptoms of infection or if you have more blood or fluid coming from the puncture site. This information is not intended to replace advice given to you by your health care provider. Make sure you discuss any questions you have with your health care provider. Document Released: 03/01/2005 Document Revised: 11/25/2017 Document Reviewed: 01/24/2016 Elsevier Patient Education  [place]. Moderate Conscious Sedation, Adult, Care After These instructions provide you with information about caring for yourself after your procedure. Your health care provider may also give you more specific instructions. Your treatment has  been planned according to current medical practices, but problems sometimes occur. Call your health care provider if you have any problems or questions after your procedure. What can I expect after the procedure? After your procedure, it is common:  To feel sleepy for several hours.  To feel clumsy and have poor balance for several hours.  To have poor judgment for several hours.  To vomit if you eat too soon. Follow these instructions at home: For at least 24 hours after the procedure:   Do not: ? Participate in activities where you could fall or become injured. ? Drive. ? Use heavy machinery. ? Drink alcohol. ? Take sleeping pills or medicines that cause drowsiness. ? Make important decisions or sign legal documents. ? Take care of children on your own.  Rest. Eating and drinking  Follow the diet recommended by your health care provider.  If you vomit: ? Drink water, juice, or soup when you can drink without vomiting. ? Make sure you have little or no nausea before eating solid foods. General instructions  Have a responsible adult stay with you until you are awake and alert.  Take over-the-counter and prescription medicines only as told by your health care provider.  If you smoke, do not smoke without supervision.  Keep all follow-up visits as told by your health care provider. This is important. Contact a health care provider if:  You keep feeling nauseous or you keep vomiting.  You feel light-headed.  You develop a rash.  You have a fever. Get help right away if:  You have trouble breathing. This information is not intended to replace advice given to you by your health care provider. Make sure you discuss any questions you have with your health care provider. Document Released: 06/02/2013 Document Revised: 07/25/2017 Document Reviewed: 12/02/2015 Elsevier Patient Education  2020 [employer].

## 2019-06-21 NOTE — Sedation Documentation (Signed)
Bone marrow Bx completed. Patient prepped for [facility] a Cath placement.

## 2019-06-21 NOTE — Progress Notes (Signed)
Spoke with pt's ride, Talyah.  Ride was confirmed.  Updated Talyah on d/c times.

## 2019-06-21 NOTE — Sedation Documentation (Signed)
Patient c/o itching all over face. No hives noted. No difficulty breathing. VSS. 25 mg Diphenhydramine IV given as per Dr Earleen Newport.

## 2019-06-21 NOTE — Consult Note (Signed)
Chief Complaint: Patient was seen in consultation today for CT-guided bone marrow biopsy and Port-A-Cath placement  Referring Physician(s): Mohamed,M  Supervising Physician: Corrie Mckusick  Patient Status: [facility] - Out-pt  History of Present Illness: Faith Pope is a 28 y.o. female smoker with history of newly diagnosed Hodgkin's lymphoma who presents today for CT-guided bone marrow biopsy for staging purposes and Port-A-Cath placement for chemotherapy.  Past Medical History:  Diagnosis Date  . Asthma     Past Surgical History:  Procedure Laterality Date  . MASS BIOPSY Right 05/11/2019   Procedure: EXCISIONAL BIOPSY OF RIGHT CERVICAL LYMPH NODE;  Surgeon: Melida Quitter, MD;  Location: [place];  Service: ENT;  Laterality: Right;  . TONSILLECTOMY      Allergies: Other, Shellfish allergy, Amoxicillin, and Penicillins  Medications: Prior to Admission medications   Medication Sig Start Date End Date Taking? Authorizing Provider  albuterol (VENTOLIN HFA) 108 (90 Base) MCG/ACT inhaler Inhale 1-2 puffs into the lungs every 6 (six) hours as needed for wheezing or shortness of breath.   Yes [provider]  allopurinol (ZYLOPRIM) 100 MG tablet Take 1 tablet (100 mg total) by mouth daily. 06/16/19   Curt Bears, MD  aspirin-acetaminophen-caffeine [facility] MIGRAINE) [id number] MG tablet Take 2 tablets by mouth every 6 (six) hours as needed for headache.    [provider]  lidocaine-prilocaine (EMLA) cream Apply 1 application topically as needed. Patient not taking: Reported on 06/16/2019 06/04/19   Heilingoetter, Cassandra L, PA-C  prochlorperazine (COMPAZINE) 10 MG tablet Take 1 tablet (10 mg total) by mouth every 6 (six) hours as needed for nausea or vomiting. 06/16/19   Curt Bears, MD     No family history on file.  Social History   Socioeconomic History  . Marital status: Single    Spouse name: Not on file  . Number of children: Not on file  .  Years of education: Not on file  . Highest education level: Not on file  Occupational History  . Not on file  Social Needs  . Financial resource strain: Not on file  . Food insecurity    Worry: Not on file    Inability: Not on file  . Transportation needs    Medical: Not on file    Non-medical: Not on file  Tobacco Use  . Smoking status: Current Every Day Smoker    Packs/day: 1.00    Types: Cigars  . Smokeless tobacco: Never Used  . Tobacco comment: former cigarette smoker  Substance and Sexual Activity  . Alcohol use: Yes    Comment: rare on special occasions  . Drug use: No  . Sexual activity: Not Currently  Lifestyle  . Physical activity    Days per week: Not on file    Minutes per session: Not on file  . Stress: Not on file  Relationships  . Social Herbalist on phone: Not on file    Gets together: Not on file    Attends religious service: Not on file    Active member of club or organization: Not on file    Attends meetings of clubs or organizations: Not on file    Relationship status: Not on file  Other Topics Concern  . Not on file  Social History Narrative  . Not on file      Review of Systems currently denies fever, headache, chest pain, dyspnea, cough, abdominal/back pain, nausea, vomiting or bleeding  Vital Signs: BP (!) 127/54 (BP Location: Right  Arm)   Pulse 70   Temp 98.1 F (36.7 C) (Oral)   Resp 16   LMP 06/16/2019 (Exact Date)   SpO2 99%   Physical Exam awake, alert.  Chest clear to auscultation bilaterally.  Heart with regular rate and rhythm.  Abdomen soft, positive bowel sounds, nontender.  No lower extremity edema.  Mild right supraclavicular adenopathy  Imaging: Nm Pet Image Initial (pi) Skull Base To Thigh  Result Date: 06/11/2019 CLINICAL DATA:  Initial treatment strategy for staging of Hodgkin's lymphoma. EXAM: NUCLEAR MEDICINE PET SKULL BASE TO THIGH TECHNIQUE: 9.1 mCi F-18 FDG was injected intravenously. Full-ring PET  imaging was performed from the skull base to thigh after the radiotracer. CT data was obtained and used for attenuation correction and anatomic localization. Fasting blood glucose: 102 mg/dl COMPARISON:  Neck CT 01/22/2019.  Chest radiograph 02/12/2017. FINDINGS: Mediastinal blood pool activity: SUV max 1.8 Liver activity: SUV max 3.1 NECK: Right supraclavicular hypermetabolic nodes. Example at 1.1 cm and a S.U.V. max of 7.3 on 33/4. There is also a least 1 small hypermetabolic low right jugular node. Incidental CT findings: No other cervical adenopathy. CHEST: Right subpectoral hypermetabolic nodes, including at 9 mm and a S.U.V. max of 7.2 on 44/4. Nodal conglomerate within the mediastinum, with a subcarinal component measuring 2.5 x 4.5 cm and a S.U.V. max of 15.5 on 59/4. Right cardiophrenic angle mass measures 5.1 x 3.5 cm and a S.U.V. max of 9.6 on 62/4. More inferior right cardiophrenic angle hypermetabolic nodes, including at 8 mm and a S.U.V. max of 4.8. Incidental CT findings: Nodules along the right minor fissure are likely subpleural lymph nodes, including at 3 mm on 30/8. Right lower lobe scarring and likely post infectious/inflammatory focal bronchiectasis medially. Example image 47/8. ABDOMEN/PELVIS: No abdominopelvic parenchymal or nodal hypermetabolism. Incidental CT findings: Normal adrenal glands. No ascites. Trace free pelvic fluid is likely physiologic. SKELETON: Mild diffuse marrow hypermetabolism, without focal abnormality. Incidental CT findings: none IMPRESSION: 1. Active lymphoma within the neck and chest, as detailed above. ([place]) 4. 2. Mild hypermetabolism diffusely throughout the marrow space. Indeterminate. Although this could be physiologic, marrow involvement cannot be excluded. Electronically Signed   By: Abigail Miyamoto M.D.   On: 06/11/2019 09:55    Labs:  CBC: Recent Labs    01/22/19 2113 05/11/19 0625 06/02/19 1042 06/21/19 0911  WBC 8.8  --  9.0 7.2  HGB 12.2 12.8  11.9* 11.9*  HCT 38.8  --  36.0 37.0  PLT 390  --  359 429*    COAGS: Recent Labs    06/21/19 0911  INR 1.0    BMP: Recent Labs    01/22/19 2113 06/02/19 1042 06/21/19 0911  NA 139 139 138  K 3.5 4.2 3.9  CL 108 105 105  CO2 22 25 23   GLUCOSE 89 95 104*  BUN 20 12 14   CALCIUM 9.3 9.6 9.1  CREATININE 0.67 0.67 0.73  GFRNONAA >60 >60 >60  GFRAA >60 >60 >60    LIVER FUNCTION TESTS: Recent Labs    06/02/19 1042  BILITOT 0.4  AST 16  ALT 15  ALKPHOS 89  PROT 7.8  ALBUMIN 4.1    TUMOR MARKERS: No results for input(s): AFPTM, CEA, CA199, CHROMGRNA in the last 8760 hours.  Assessment and Plan: 28 y.o. female smoker with history of newly diagnosed Hodgkin's lymphoma who presents today for CT-guided bone marrow biopsy for staging purposes and Port-A-Cath placement for chemotherapy.  Details/risks of procedures, including but not limited  to, internal bleeding, infection, injury to adjacent structures discussed with patient with her understanding and consent.   Thank you for this interesting consult.  I greatly enjoyed meeting Faith Pope and look forward to participating in their care.  A copy of this report was sent to the requesting provider on this date.  Electronically Signed: D. Rowe Robert, PA-C 06/21/2019, 10:28 AM   I spent a total of 25 minutes in face to face in clinical consultation, greater than 50% of which was counseling/coordinating care for image guided bone marrow biopsy and Port-A-Cath placement

## 2019-06-21 NOTE — Sedation Documentation (Signed)
Patient no longer itching.

## 2019-06-22 ENCOUNTER — Other Ambulatory Visit: Payer: Self-pay | Admitting: Physician Assistant

## 2019-06-22 ENCOUNTER — Encounter (HOSPITAL_COMMUNITY): Payer: Self-pay | Admitting: *Deleted

## 2019-06-22 DIAGNOSIS — C8111 Nodular sclerosis classical Hodgkin lymphoma, lymph nodes of head, face, and neck: Secondary | ICD-10-CM

## 2019-06-22 LAB — SURGICAL PATHOLOGY

## 2019-06-23 ENCOUNTER — Other Ambulatory Visit: Payer: Self-pay | Admitting: Medical Oncology

## 2019-06-23 ENCOUNTER — Inpatient Hospital Stay: Payer: Self-pay

## 2019-06-23 ENCOUNTER — Other Ambulatory Visit: Payer: Self-pay

## 2019-06-23 DIAGNOSIS — C8111 Nodular sclerosis classical Hodgkin lymphoma, lymph nodes of head, face, and neck: Secondary | ICD-10-CM

## 2019-06-23 MED ORDER — LIDOCAINE-PRILOCAINE 2.5-2.5 % EX CREA: 1.0000 "application " | TOPICAL_CREAM | CUTANEOUS | 0 refills | Status: DC | PRN

## 2019-06-23 MED ORDER — ALLOPURINOL 100 MG PO TABS: 100.0000 mg | ORAL_TABLET | Freq: Every day | ORAL | 2 refills | Status: DC

## 2019-06-23 MED ORDER — PROCHLORPERAZINE MALEATE 10 MG PO TABS: 10.0000 mg | ORAL_TABLET | Freq: Four times a day (QID) | ORAL | 0 refills | Status: DC | PRN

## 2019-06-23 MED FILL — ALLOPURINOL 100 MG TABS: 100 | 60 days supply | Qty: 60 | Fill #0

## 2019-06-23 MED FILL — PROCHLORPERAZINE 10 MG TAB: 10 | 7 days supply | Qty: 30 | Fill #0

## 2019-06-23 MED FILL — LIDOCAINE-PRILOCAINE CREAM: 2.5-2.5 | 15 days supply | Qty: 30 | Fill #0

## 2019-06-23 NOTE — Progress Notes (Signed)
Pt wants rx to go to [facility] . I cancelled rx at [facility].

## 2019-06-24 ENCOUNTER — Inpatient Hospital Stay: Payer: Self-pay

## 2019-06-24 ENCOUNTER — Telehealth: Payer: Self-pay

## 2019-06-24 ENCOUNTER — Other Ambulatory Visit: Payer: Self-pay

## 2019-06-24 ENCOUNTER — Inpatient Hospital Stay (HOSPITAL_BASED_OUTPATIENT_CLINIC_OR_DEPARTMENT_OTHER): Payer: Self-pay | Admitting: Medical

## 2019-06-24 ENCOUNTER — Other Ambulatory Visit: Payer: Self-pay | Admitting: Medical

## 2019-06-24 VITALS — BP 120/74 | HR 77 | Temp 98.2°F | Resp 19

## 2019-06-24 DIAGNOSIS — Z95828 Presence of other vascular implants and grafts: Secondary | ICD-10-CM

## 2019-06-24 DIAGNOSIS — C8111 Nodular sclerosis classical Hodgkin lymphoma, lymph nodes of head, face, and neck: Secondary | ICD-10-CM

## 2019-06-24 LAB — CBC WITH DIFFERENTIAL (CANCER CENTER ONLY)
Abs Immature Granulocytes: 0.01 10*3/uL (ref 0.00–0.07)
Basophils Absolute: 0 10*3/uL (ref 0.0–0.1)
Basophils Relative: 0 %
Eosinophils Absolute: 0.4 10*3/uL (ref 0.0–0.5)
Eosinophils Relative: 6 %
HCT: 36 % (ref 36.0–46.0)
Hemoglobin: 12 g/dL (ref 12.0–15.0)
Immature Granulocytes: 0 %
Lymphocytes Relative: 37 %
Lymphs Abs: 2.5 10*3/uL (ref 0.7–4.0)
MCH: 28.7 pg (ref 26.0–34.0)
MCHC: 33.3 g/dL (ref 30.0–36.0)
MCV: 86.1 fL (ref 80.0–100.0)
Monocytes Absolute: 0.5 10*3/uL (ref 0.1–1.0)
Monocytes Relative: 8 %
Neutro Abs: 3.3 10*3/uL (ref 1.7–7.7)
Neutrophils Relative %: 49 %
Platelet Count: 372 10*3/uL (ref 150–400)
RBC: 4.18 MIL/uL (ref 3.87–5.11)
RDW: 13.2 % (ref 11.5–15.5)
WBC Count: 6.7 10*3/uL (ref 4.0–10.5)
nRBC: 0 % (ref 0.0–0.2)

## 2019-06-24 LAB — CMP (CANCER CENTER ONLY)
ALT: 17 U/L (ref 0–44)
AST: 18 U/L (ref 15–41)
Albumin: 3.8 g/dL (ref 3.5–5.0)
Alkaline Phosphatase: 82 U/L (ref 38–126)
Anion gap: 10 (ref 5–15)
BUN: 10 mg/dL (ref 6–20)
CO2: 24 mmol/L (ref 22–32)
Calcium: 9.1 mg/dL (ref 8.9–10.3)
Chloride: 105 mmol/L (ref 98–111)
Creatinine: 0.67 mg/dL (ref 0.44–1.00)
GFR, Est AFR Am: >60 mL/min (ref >60)
GFR, Estimated: >60 mL/min (ref >60)
Glucose, Bld: 107 mg/dL — ABNORMAL HIGH (ref 70–99)
Potassium: 3.7 mmol/L (ref 3.5–5.1)
Sodium: 139 mmol/L (ref 135–145)
Total Bilirubin: 0.4 mg/dL (ref 0.3–1.2)
Total Protein: 7.4 g/dL (ref 6.5–8.1)

## 2019-06-24 LAB — PREGNANCY, URINE: Preg Test, Ur: NEGATIVE

## 2019-06-24 MED ORDER — HEPARIN SOD (PORK) LOCK FLUSH 100 UNIT/ML IV SOLN
500.0000 [IU] | Freq: Once | INTRAVENOUS | Status: DC
  Filled 2019-06-24: qty 5

## 2019-06-24 MED ORDER — SODIUM CHLORIDE 0.9% FLUSH
10.0000 mL | INTRAVENOUS | Status: DC | PRN
  Administered 2019-06-24: 10 mL
  Filled 2019-06-24: qty 10

## 2019-06-24 MED ORDER — SODIUM CHLORIDE 0.9 % IV SOLN
375.0000 mg/m2 | Freq: Once | INTRAVENOUS | Status: AC
  Administered 2019-06-24: 17:00:00 720 mg via INTRAVENOUS
  Filled 2019-06-24: qty 72

## 2019-06-24 MED ORDER — PALONOSETRON HCL INJECTION 0.25 MG/5ML
0.2500 mg | Freq: Once | INTRAVENOUS | Status: AC
  Administered 2019-06-24: 15:00:00 0.25 mg via INTRAVENOUS

## 2019-06-24 MED ORDER — HEPARIN SOD (PORK) LOCK FLUSH 100 UNIT/ML IV SOLN
500.0000 [IU] | Freq: Once | INTRAVENOUS | Status: AC | PRN
  Administered 2019-06-24: 18:00:00 500 [IU]
  Filled 2019-06-24: qty 5

## 2019-06-24 MED ORDER — SODIUM CHLORIDE 0.9 % IV SOLN
Freq: Once | INTRAVENOUS | Status: AC
  Administered 2019-06-24: 15:00:00 via INTRAVENOUS
  Filled 2019-06-24: qty 5

## 2019-06-24 MED ORDER — SODIUM CHLORIDE 0.9 % IV SOLN
Freq: Once | INTRAVENOUS | Status: AC
  Administered 2019-06-24: 15:00:00 via INTRAVENOUS
  Filled 2019-06-24: qty 250

## 2019-06-24 MED ORDER — PALONOSETRON HCL INJECTION 0.25 MG/5ML
INTRAVENOUS | Status: AC
  Filled 2019-06-24: qty 5

## 2019-06-24 MED ORDER — SODIUM CHLORIDE 0.9 % IV SOLN
10.0000 [IU]/m2 | Freq: Once | INTRAVENOUS | Status: AC
  Administered 2019-06-24: 19 [IU] via INTRAVENOUS
  Filled 2019-06-24: qty 6.33

## 2019-06-24 MED ORDER — SULFAMETHOXAZOLE-TRIMETHOPRIM 800-160 MG PO TABS: 1.0000 | ORAL_TABLET | Freq: Two times a day (BID) | ORAL | 0 refills | Status: DC

## 2019-06-24 MED ORDER — SODIUM CHLORIDE 0.9% FLUSH
10.0000 mL | INTRAVENOUS | Status: DC | PRN
  Administered 2019-06-24: 13:00:00 10 mL via INTRAVENOUS
  Filled 2019-06-24: qty 10

## 2019-06-24 MED ORDER — VINBLASTINE SULFATE CHEMO INJECTION 1 MG/ML
5.7800 mg/m2 | Freq: Once | INTRAVENOUS | Status: AC
  Administered 2019-06-24: 11 mg via INTRAVENOUS
  Filled 2019-06-24: qty 11

## 2019-06-24 MED ORDER — DOXORUBICIN HCL CHEMO IV INJECTION 2 MG/ML
25.0000 mg/m2 | Freq: Once | INTRAVENOUS | Status: AC
  Administered 2019-06-24: 48 mg via INTRAVENOUS
  Filled 2019-06-24: qty 24

## 2019-06-24 MED FILL — SULFAMETHOXAZOLE-TMP DS TAB: 800-160 | 7 days supply | Qty: 14 | Fill #0

## 2019-06-24 NOTE — Patient Instructions (Addendum)
[facility] Discharge Instructions for Patients Receiving Chemotherapy  Today you received the following chemotherapy agents: Doxorubicin, Vinblastine, Bleomycin, Dacarbazine  To help prevent nausea and vomiting after your treatment, we encourage you to take your nausea medication as directed.    If you develop nausea and vomiting that is not controlled by your nausea medication, call the clinic.   BELOW ARE SYMPTOMS THAT SHOULD BE REPORTED IMMEDIATELY:  *FEVER GREATER THAN 100.5 F  *CHILLS WITH OR WITHOUT FEVER  NAUSEA AND VOMITING THAT IS NOT CONTROLLED WITH YOUR NAUSEA MEDICATION  *UNUSUAL SHORTNESS OF BREATH  *UNUSUAL BRUISING OR BLEEDING  TENDERNESS IN MOUTH AND THROAT WITH OR WITHOUT PRESENCE OF ULCERS  *URINARY PROBLEMS  *BOWEL PROBLEMS  UNUSUAL RASH Items with * indicate a potential emergency and should be followed up as soon as possible.  Feel free to call the clinic should you have any questions or concerns. The clinic phone number is [phone].  Please show the [facility] at check-in to the Emergency Department and triage nurse.  Doxorubicin injection What is this medicine? DOXORUBICIN (dox oh ROO bi sin) is a chemotherapy drug. It is used to treat many kinds of cancer like leukemia, lymphoma, neuroblastoma, sarcoma, and Wilms' tumor. It is also used to treat bladder cancer, breast cancer, lung cancer, ovarian cancer, stomach cancer, and thyroid cancer. This medicine may be used for other purposes; ask your health care provider or pharmacist if you have questions. COMMON BRAND NAME(S): Adriamycin, Adriamycin PFS, Adriamycin RDF, Rubex What should I tell my health care provider before I take this medicine? They need to know if you have any of these conditions:  heart disease  history of low blood counts caused by a medicine  liver disease  recent or ongoing radiation therapy  an unusual or allergic reaction to doxorubicin, other  chemotherapy agents, other medicines, foods, dyes, or preservatives  pregnant or trying to get pregnant  breast-feeding How should I use this medicine? This drug is given as an infusion into a vein. It is administered in a hospital or clinic by a specially trained health care professional. If you have pain, swelling, burning or any unusual feeling around the site of your injection, tell your health care professional right away. Talk to your pediatrician regarding the use of this medicine in children. Special care may be needed. Overdosage: If you think you have taken too much of this medicine contact a poison control center or emergency room at once. NOTE: This medicine is only for you. Do not share this medicine with others. What if I miss a dose? It is important not to miss your dose. Call your doctor or health care professional if you are unable to keep an appointment. What may interact with this medicine? This medicine may interact with the following medications:  6-mercaptopurine  paclitaxel  phenytoin  St. John's Wort  trastuzumab  verapamil This list may not describe all possible interactions. Give your health care provider a list of all the medicines, herbs, non-prescription drugs, or dietary supplements you use. Also tell them if you smoke, drink alcohol, or use illegal drugs. Some items may interact with your medicine. What should I watch for while using this medicine? This drug may make you feel generally unwell. This is not uncommon, as chemotherapy can affect healthy cells as well as cancer cells. Report any side effects. Continue your course of treatment even though you feel ill unless your doctor tells you to stop. There is a  maximum amount of this medicine you should receive throughout your life. The amount depends on the medical condition being treated and your overall health. Your doctor will watch how much of this medicine you receive in your lifetime. Tell your doctor  if you have taken this medicine before. You may need blood work done while you are taking this medicine. Your urine may turn red for a few days after your dose. This is not blood. If your urine is dark or brown, call your doctor. In some cases, you may be given additional medicines to help with side effects. Follow all directions for their use. Call your doctor or health care professional for advice if you get a fever, chills or sore throat, or other symptoms of a cold or flu. Do not treat yourself. This drug decreases your body's ability to fight infections. Try to avoid being around people who are sick. This medicine may increase your risk to bruise or bleed. Call your doctor or health care professional if you notice any unusual bleeding. Talk to your doctor about your risk of cancer. You may be more at risk for certain types of cancers if you take this medicine. Do not become pregnant while taking this medicine or for 6 months after stopping it. Women should inform their doctor if they wish to become pregnant or think they might be pregnant. Men should not father a child while taking this medicine and for 6 months after stopping it. There is a potential for serious side effects to an unborn child. Talk to your health care professional or pharmacist for more information. Do not breast-feed an infant while taking this medicine. This medicine has caused ovarian failure in some women and reduced sperm counts in some men This medicine may interfere with the ability to have a child. Talk with your doctor or health care professional if you are concerned about your fertility. This medicine may cause a decrease in Co-Enzyme Q-10. You should make sure that you get enough Co-Enzyme Q-10 while you are taking this medicine. Discuss the foods you eat and the vitamins you take with your health care professional. What side effects may I notice from receiving this medicine? Side effects that you should report to your  doctor or health care professional as soon as possible:  allergic reactions like skin rash, itching or hives, swelling of the face, lips, or tongue  breathing problems  chest pain  fast or irregular heartbeat  low blood counts - this medicine may decrease the number of white blood cells, red blood cells and platelets. You may be at increased risk for infections and bleeding.  pain, redness, or irritation at site where injected  signs of infection - fever or chills, cough, sore throat, pain or difficulty passing urine  signs of decreased platelets or bleeding - bruising, pinpoint red spots on the skin, black, tarry stools, blood in the urine  swelling of the ankles, feet, hands  tiredness  weakness Side effects that usually do not require medical attention (report to your doctor or health care professional if they continue or are bothersome):  diarrhea  hair loss  mouth sores  nail discoloration or damage  nausea  red colored urine  vomiting This list may not describe all possible side effects. Call your doctor for medical advice about side effects. You may report side effects to FDA at 1-800-FDA-1088. Where should I keep my medicine? This drug is given in a hospital or clinic and will not be stored  at home. NOTE: This sheet is a summary. It may not cover all possible information. If you have questions about this medicine, talk to your doctor, pharmacist, or health care provider.  2020 Elsevier/Gold Standard (2017-03-26 11:01:26)  Vinblastine injection What is this medicine? VINBLASTINE (vin BLAS teen) is a chemotherapy drug. It slows the growth of cancer cells. This medicine is used to treat many types of cancer like breast cancer, testicular cancer, Hodgkin's disease, non-Hodgkin's lymphoma, and sarcoma. This medicine may be used for other purposes; ask your health care provider or pharmacist if you have questions. COMMON BRAND NAME(S): Velban What should I tell my  health care provider before I take this medicine? They need to know if you have any of these conditions:  blood disorders  dental disease  gout  infection (especially a virus infection such as chickenpox, cold sores, or herpes)  liver disease  lung disease  nervous system disease  recent or ongoing radiation therapy  an unusual or allergic reaction to vinblastine, other chemotherapy agents, other medicines, foods, dyes, or preservatives  pregnant or trying to get pregnant  breast-feeding How should I use this medicine? This drug is given as an infusion into a vein. It is administered in a hospital or clinic by a specially trained health care professional. If you have pain, swelling, burning or any unusual feeling around the site of your injection, tell your health care professional right away. Talk to your pediatrician regarding the use of this medicine in children. While this drug may be prescribed for selected conditions, precautions do apply. Overdosage: If you think you have taken too much of this medicine contact a poison control center or emergency room at once. NOTE: This medicine is only for you. Do not share this medicine with others. What if I miss a dose? It is important not to miss your dose. Call your doctor or health care professional if you are unable to keep an appointment. What may interact with this medicine? Do not take this medicine with any of the following medications:  erythromycin  itraconazole  mibefradil  voriconazole This medicine may also interact with the following medications:  cyclosporine  fluconazole  ketoconazole  medicines for seizures like phenytoin  medicines to increase blood counts like filgrastim, pegfilgrastim, sargramostim  vaccines  verapamil Talk to your doctor or health care professional before taking any of these medicines:  acetaminophen  aspirin  ibuprofen  ketoprofen  naproxen This list may not  describe all possible interactions. Give your health care provider a list of all the medicines, herbs, non-prescription drugs, or dietary supplements you use. Also tell them if you smoke, drink alcohol, or use illegal drugs. Some items may interact with your medicine. What should I watch for while using this medicine? Your condition will be monitored carefully while you are receiving this medicine. You will need important blood work done while you are taking this medicine. This drug may make you feel generally unwell. This is not uncommon, as chemotherapy can affect healthy cells as well as cancer cells. Report any side effects. Continue your course of treatment even though you feel ill unless your doctor tells you to stop. In some cases, you may be given additional medicines to help with side effects. Follow all directions for their use. Call your doctor or health care professional for advice if you get a fever, chills or sore throat, or other symptoms of a cold or flu. Do not treat yourself. This drug decreases your body's ability to  fight infections. Try to avoid being around people who are sick. This medicine may increase your risk to bruise or bleed. Call your doctor or health care professional if you notice any unusual bleeding. Be careful brushing and flossing your teeth or using a toothpick because you may get an infection or bleed more easily. If you have any dental work done, tell your dentist you are receiving this medicine. Avoid taking products that contain aspirin, acetaminophen, ibuprofen, naproxen, or ketoprofen unless instructed by your doctor. These medicines may hide a fever. Do not become pregnant while taking this medicine. Women should inform their doctor if they wish to become pregnant or think they might be pregnant. There is a potential for serious side effects to an unborn child. Talk to your health care professional or pharmacist for more information. Do not breast-feed an infant  while taking this medicine. Men may have a lower sperm count while taking this medicine. Talk to your doctor if you plan to father a child. What side effects may I notice from receiving this medicine? Side effects that you should report to your doctor or health care professional as soon as possible:  allergic reactions like skin rash, itching or hives, swelling of the face, lips, or tongue  low blood counts - This drug may decrease the number of white blood cells, red blood cells and platelets. You may be at increased risk for infections and bleeding.  signs of infection - fever or chills, cough, sore throat, pain or difficulty passing urine  signs of decreased platelets or bleeding - bruising, pinpoint red spots on the skin, black, tarry stools, nosebleeds  signs of decreased red blood cells - unusually weak or tired, fainting spells, lightheadedness  breathing problems  changes in hearing  change in the amount of urine  chest pain  high blood pressure  mouth sores  nausea and vomiting  pain, swelling, redness or irritation at the injection site  pain, tingling, numbness in the hands or feet  problems with balance, dizziness  seizures Side effects that usually do not require medical attention (report to your doctor or health care professional if they continue or are bothersome):  constipation  hair loss  jaw pain  loss of appetite  sensitivity to light  stomach pain  tumor pain This list may not describe all possible side effects. Call your doctor for medical advice about side effects. You may report side effects to FDA at 1-800-FDA-1088. Where should I keep my medicine? This drug is given in a hospital or clinic and will not be stored at home. NOTE: This sheet is a summary. It may not cover all possible information. If you have questions about this medicine, talk to your doctor, pharmacist, or health care provider.  2020 Elsevier/Gold Standard (2008-05-09  17:15:59)  Bleomycin injection What is this medicine? BLEOMYCIN (blee oh MYE sin) is a chemotherapy drug. It is used to treat many kinds of cancer like lymphoma, cervical cancer, head and neck cancer, and testicular cancer. It is also used to prevent and to treat fluid build-up around the lungs caused by some cancers. This medicine may be used for other purposes; ask your health care provider or pharmacist if you have questions. COMMON BRAND NAME(S): Blenoxane What should I tell my health care provider before I take this medicine? They need to know if you have any of these conditions:  cigarette smoker  kidney disease  lung disease  recent or ongoing radiation therapy  an unusual or allergic  reaction to bleomycin, other chemotherapy agents, other medicines, foods, dyes, or preservatives  pregnant or trying to get pregnant  breast-feeding How should I use this medicine? This drug is given as an infusion into a vein or a body cavity. It can also be given as an injection into a muscle or under the skin. It is administered in a hospital or clinic by a specially trained health care professional. Talk to your pediatrician regarding the use of this medicine in children. Special care may be needed. Overdosage: If you think you have taken too much of this medicine contact a poison control center or emergency room at once. NOTE: This medicine is only for you. Do not share this medicine with others. What if I miss a dose? It is important not to miss your dose. Call your doctor or health care professional if you are unable to keep an appointment. What may interact with this medicine?  certain antibiotics given by injection  cisplatin  cyclosporine  diuretics  foscarnet  medicines to increase blood counts like filgrastim, pegfilgrastim, sargramostim  vaccines This list may not describe all possible interactions. Give your health care provider a list of all the medicines, herbs,  non-prescription drugs, or dietary supplements you use. Also tell them if you smoke, drink alcohol, or use illegal drugs. Some items may interact with your medicine. What should I watch for while using this medicine? Visit your doctor for checks on your progress. This drug may make you feel generally unwell. This is not uncommon, as chemotherapy can affect healthy cells as well as cancer cells. Report any side effects. Continue your course of treatment even though you feel ill unless your doctor tells you to stop. Call your doctor or health care professional for advice if you get a fever, chills or sore throat, or other symptoms of a cold or flu. Do not treat yourself. This drug decreases your body's ability to fight infections. Try to avoid being around people who are sick. Avoid taking products that contain aspirin, acetaminophen, ibuprofen, naproxen, or ketoprofen unless instructed by your doctor. These medicines may hide a fever. Do not become pregnant while taking this medicine. Women should inform their doctor if they wish to become pregnant or think they might be pregnant. There is a potential for serious side effects to an unborn child. Talk to your health care professional or pharmacist for more information. Do not breast-feed an infant while taking this medicine. There is a maximum amount of this medicine you should receive throughout your life. The amount depends on the medical condition being treated and your overall health. Your doctor will watch how much of this medicine you receive in your lifetime. Tell your doctor if you have taken this medicine before. What side effects may I notice from receiving this medicine? Side effects that you should report to your doctor or health care professional as soon as possible:  allergic reactions like skin rash, itching or hives, swelling of the face, lips, or tongue  breathing problems  chest pain  confusion  cough  fast, irregular  heartbeat  feeling faint or lightheaded, falls  fever or chills  mouth sores  pain, tingling, numbness in the hands or feet  trouble passing urine or change in the amount of urine  yellowing of the eyes or skin Side effects that usually do not require medical attention (report to your doctor or health care professional if they continue or are bothersome):  darker skin color  hair loss  irritation at site where injected  loss of appetite  nail changes  nausea and vomiting  weight loss This list may not describe all possible side effects. Call your doctor for medical advice about side effects. You may report side effects to FDA at 1-800-FDA-1088. Where should I keep my medicine? This drug is given in a hospital or clinic and will not be stored at home. NOTE: This sheet is a summary. It may not cover all possible information. If you have questions about this medicine, talk to your doctor, pharmacist, or health care provider.  2020 Elsevier/Gold Standard (2012-12-08 09:36:48)  Dacarbazine, DTIC injection What is this medicine? DACARBAZINE (da KAR ba zeen) is a chemotherapy drug. This medicine is used to treat skin cancer. It is also used with other medicines to treat Hodgkin's disease. This medicine may be used for other purposes; ask your health care provider or pharmacist if you have questions. COMMON BRAND NAME(S): DTIC-Dome What should I tell my health care provider before I take this medicine? They need to know if you have any of these conditions:  infection (especially virus infection such as chickenpox, cold sores, or herpes)  kidney disease  liver disease  low blood counts like low platelets, red blood cells, white blood cells  recent radiation therapy  an unusual or allergic reaction to dacarbazine, other chemotherapy agents, other medicines, foods, dyes, or preservatives  pregnant or trying to get pregnant  breast-feeding How should I use this  medicine? This drug is given as an injection or infusion into a vein. It is administered in a hospital or clinic by a specially trained health care professional. Talk to your pediatrician regarding the use of this medicine in children. While this drug may be prescribed for selected conditions, precautions do apply. Overdosage: If you think you have taken too much of this medicine contact a poison control center or emergency room at once. NOTE: This medicine is only for you. Do not share this medicine with others. What if I miss a dose? It is important not to miss your dose. Call your doctor or health care professional if you are unable to keep an appointment. What may interact with this medicine?  medicines to increase blood counts like filgrastim, pegfilgrastim, sargramostim  vaccines This list may not describe all possible interactions. Give your health care provider a list of all the medicines, herbs, non-prescription drugs, or dietary supplements you use. Also tell them if you smoke, drink alcohol, or use illegal drugs. Some items may interact with your medicine. What should I watch for while using this medicine? Your condition will be monitored carefully while you are receiving this medicine. You will need important blood work done while you are taking this medicine. This drug may make you feel generally unwell. This is not uncommon, as chemotherapy can affect healthy cells as well as cancer cells. Report any side effects. Continue your course of treatment even though you feel ill unless your doctor tells you to stop. Call your doctor or health care professional for advice if you get a fever, chills or sore throat, or other symptoms of a cold or flu. Do not treat yourself. This drug decreases your body's ability to fight infections. Try to avoid being around people who are sick. This medicine may increase your risk to bruise or bleed. Call your doctor or health care professional if you notice  any unusual bleeding. Talk to your doctor about your risk of cancer. You may be more at risk for  certain types of cancers if you take this medicine. Do not become pregnant while taking this medicine. Women should inform their doctor if they wish to become pregnant or think they might be pregnant. There is a potential for serious side effects to an unborn child. Talk to your health care professional or pharmacist for more information. Do not breast-feed an infant while taking this medicine. What side effects may I notice from receiving this medicine? Side effects that you should report to your doctor or health care professional as soon as possible:  allergic reactions like skin rash, itching or hives, swelling of the face, lips, or tongue  low blood counts - this medicine may decrease the number of white blood cells, red blood cells and platelets. You may be at increased risk for infections and bleeding.  signs of infection - fever or chills, cough, sore throat, pain or difficulty passing urine  signs of decreased platelets or bleeding - bruising, pinpoint red spots on the skin, black, tarry stools, blood in the urine  signs of decreased red blood cells - unusually weak or tired, fainting spells, lightheadedness  breathing problems  muscle pains  pain at site where injected  trouble passing urine or change in the amount of urine  vomiting  yellowing of the eyes or skin Side effects that usually do not require medical attention (report to your doctor or health care professional if they continue or are bothersome):  diarrhea  hair loss  loss of appetite  nausea  skin more sensitive to sun or ultraviolet light  stomach upset This list may not describe all possible side effects. Call your doctor for medical advice about side effects. You may report side effects to FDA at 1-800-FDA-1088. Where should I keep my medicine? This drug is given in a hospital or clinic and will not be  stored at home. NOTE: This sheet is a summary. It may not cover all possible information. If you have questions about this medicine, talk to your doctor, pharmacist, or health care provider.  2020 Elsevier/Gold Standard (2015-10-13 15:17:39)

## 2019-06-24 NOTE — Progress Notes (Signed)
Patient complained of feeling" lightheaded" during dacarbazine infusion. Denied any further symptoms. Infusion was stopped. Vital signs obtained, BP-132/85, P-71, O2-100%. Sandi Mealy PA notified. Pt assessed by Lucianne Lei. Patient was observed, and within 5-10 minutes, patient reported improvement of symptoms. Instructed by PA to resume infusion, but to run IV fluids concurrently. Patient stable at this time in NAD with dacarbazine infusing.

## 2019-06-24 NOTE — Telephone Encounter (Signed)
Pt came in today for first treatment. Port placed on Monday 06/21/19 Pt stated that bandage fell off of incision had blood and clear drainage. Removed bandage, the end of incision is a little open with some blood and clear drainage. Faith Pope looked and incision and antibiotic called in to pharmacy band-aid placed.Ok to use port

## 2019-06-25 NOTE — Progress Notes (Signed)
This patient was seen in the infusion room as she was receiving DTIC.  She had completed Adriamycin, vincristine, and bleomycin.  She reports that she was having some dizziness while receiving  DTIC.  She had been premedicated with Aloxi, Emend, and Decadron.  She had normal saline started and given concurrently with DTIC.  She was able to complete her infusion without any additional issues of concern.  Sandi Mealy, MHS, PA-C Physician Assistant

## 2019-06-28 ENCOUNTER — Encounter (HOSPITAL_COMMUNITY): Payer: Self-pay | Admitting: Internal Medicine

## 2019-06-28 ENCOUNTER — Telehealth: Payer: Self-pay | Admitting: Emergency Medicine

## 2019-06-28 ENCOUNTER — Inpatient Hospital Stay: Payer: Self-pay | Attending: Physician Assistant | Admitting: Medical

## 2019-06-28 ENCOUNTER — Other Ambulatory Visit: Payer: Self-pay

## 2019-06-28 VITALS — BP 109/92 | HR 82 | Temp 98.5°F | Resp 17 | Ht 62.0 in | Wt 185.7 lb

## 2019-06-28 DIAGNOSIS — F17210 Nicotine dependence, cigarettes, uncomplicated: Secondary | ICD-10-CM | POA: Insufficient documentation

## 2019-06-28 DIAGNOSIS — Z5111 Encounter for antineoplastic chemotherapy: Secondary | ICD-10-CM | POA: Insufficient documentation

## 2019-06-28 DIAGNOSIS — C8111 Nodular sclerosis classical Hodgkin lymphoma, lymph nodes of head, face, and neck: Secondary | ICD-10-CM

## 2019-06-28 DIAGNOSIS — Z452 Encounter for adjustment and management of vascular access device: Secondary | ICD-10-CM

## 2019-06-28 NOTE — Patient Instructions (Signed)
COVID-19: How to Protect Yourself and Others Know how it spreads  There is currently no vaccine to prevent coronavirus disease 2019 (COVID-19).  The best way to prevent illness is to avoid being exposed to this virus.  The virus is thought to spread mainly from person-to-person. ? Between people who are in close contact with one another (within about 6 feet). ? Through respiratory droplets produced when an infected person coughs, sneezes or talks. ? These droplets can land in the mouths or noses of people who are nearby or possibly be inhaled into the lungs. ? Some recent studies have suggested that COVID-19 may be spread by people who are not showing symptoms. Everyone should Clean your hands often  Wash your hands often with soap and water for at least 20 seconds especially after you have been in a public place, or after blowing your nose, coughing, or sneezing.  If soap and water are not readily available, use a hand sanitizer that contains at least 60% alcohol. Cover all surfaces of your hands and rub them together until they feel dry.  Avoid touching your eyes, nose, and mouth with unwashed hands. Avoid close contact  Stay home if you are sick.  Avoid close contact with people who are sick.  Put distance between yourself and other people. ? Remember that some people without symptoms may be able to spread virus. ? This is especially important for people who are at higher risk of getting very sick.www.cdc.gov/coronavirus/2019-ncov/need-extra-precautions/people-at-higher-risk.html Cover your mouth and nose with a cloth face cover when around others  You could spread COVID-19 to others even if you do not feel sick.  Everyone should wear a cloth face cover when they have to go out in public, for example to the grocery store or to pick up other necessities. ? Cloth face coverings should not be placed on young children under age 2, anyone who has trouble breathing, or is unconscious,  incapacitated or otherwise unable to remove the mask without assistance.  The cloth face cover is meant to protect other people in case you are infected.  Do NOT use a facemask meant for a healthcare worker.  Continue to keep about 6 feet between yourself and others. The cloth face cover is not a substitute for social distancing. Cover coughs and sneezes  If you are in a private setting and do not have on your cloth face covering, remember to always cover your mouth and nose with a tissue when you cough or sneeze or use the inside of your elbow.  Throw used tissues in the trash.  Immediately wash your hands with soap and water for at least 20 seconds. If soap and water are not readily available, clean your hands with a hand sanitizer that contains at least 60% alcohol. Clean and disinfect  Clean AND disinfect frequently touched surfaces daily. This includes tables, doorknobs, light switches, countertops, handles, desks, phones, keyboards, toilets, faucets, and sinks. www.cdc.gov/coronavirus/2019-ncov/prevent-getting-sick/disinfecting-your-home.html  If surfaces are dirty, clean them: Use detergent or soap and water prior to disinfection.  Then, use a household disinfectant. You can see a list of EPA-registered household disinfectants here. cdc.gov/coronavirus 12/29/2018 This information is not intended to replace advice given to you by your health care provider. Make sure you discuss any questions you have with your health care provider. Document Released: 12/08/2018 Document Revised: 01/06/2019 Document Reviewed: 12/08/2018 Elsevier Patient Education  2020 Elsevier Inc.  

## 2019-06-28 NOTE — Progress Notes (Signed)
[facility] Progress Note   Carlise Stofer [id number] 13-Apr-1991 28 y.o.  Akylah Hascall is managed by Dr. Fanny Bien. 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  The patient was told that the sensation that she has felt in her right chest is likely due to postoperative changes after having her port placed.  She was reassured that her port looks fine with no evidence of infection, induration, erythema, or other issues of concern.  Nodular sclerosing Hodgkin's lymphoma: The patient continues to be managed by Dr. Julien Nordmann and is status post cycle 1 of ABVD which was dosed on 06/24/2019.  She is scheduled to be seen in follow-up on 07/07/2019.  Please see After Visit Summary for patient specific instructions.  Future Appointments  Date Time Provider [place]  06/28/2019 12:30 PM Harle Stanford., PA-C [facility] None  07/07/2019 11:00 AM [facility] LAB 6 [facility] None  07/07/2019 11:15 AM [facility] [place] FLUSH [facility] None  07/07/2019 11:45 AM Curt Bears, MD [facility] None  07/07/2019 12:15 PM [facility] INFUSION [facility] None  07/21/2019 10:00 AM [facility] LAB 2 [facility] None  07/21/2019 10:15 AM [facility] [place] FLUSH [facility] None  07/21/2019 10:30 AM Heilingoetter, Cassandra L, PA-C [facility] None  07/21/2019 11:15 AM [facility] INFUSION [facility] None  08/04/2019 10:45 AM [facility] LAB 4 [facility] None  08/04/2019 11:00 AM [facility] [place] FLUSH [facility] None  08/04/2019 11:30 AM Curt Bears, MD [facility] None  08/04/2019 12:15 PM [facility] INFUSION  [facility] None    No orders of the defined types were placed in this encounter.      Subjective:   ID:  Gicela Schwarting is a 28 y.o. (DOB [birth year]) female.  Chief Complaint: No chief complaint on file.  HPI Carleena Mires  Is a 28 y.o. female with a diagnosis of a  nodular sclerosing Hodgkin's lymphoma. She is followed by Dr. Julien Nordmann and is status post cycle 1 of ABVD which was dosed on 06/24/2019.  She was seen in the function last week when she reported that she had a small opening in her port incision.  There had been scant purulent material draining from the area.  There was no evidence of material draining from the area when she was seen last week.  She was given a prescription for Bactrim DS p.o. twice daily for 7 days.  She called her office earlier today stating that she was having swelling, tenderness, and redness at her port site.  She reports that she had mild anorexia and nausea this weekend along with decreased taste of food and mild fatigue after having chemotherapy last week.  Medications: I have reviewed the patient's current medications.  Allergies:  Allergies  Allergen Reactions   Other Other (See Comments)    Mouth burns and tongue numb   Shellfish Allergy Other (See Comments)    Mouth burns and tongue numb   Amoxicillin Rash   Penicillins Rash    Has patient had a PCN reaction causing immediate rash, facial/tongue/throat swelling, SOB or lightheadedness with hypotension: rash Has patient had a PCN reaction causing severe rash involving mucus membranes or skin necrosis: No Has patient had a PCN reaction that required hospitalization: No Has patient  had a PCN reaction occurring within the last 10 years: No If all of the above answers are "NO", then may proceed with Cephalosporin use.    Past Medical History:  Diagnosis Date   Asthma     Past Surgical History:  Procedure Laterality Date   IR BONE MARROW BIOPSY & ASPIRATION  06/21/2019   IR IMAGING GUIDED  PORT INSERTION  06/21/2019   MASS BIOPSY Right 05/11/2019   Procedure: EXCISIONAL BIOPSY OF RIGHT CERVICAL LYMPH NODE;  Surgeon: Melida Quitter, MD;  Location: [facility];  Service: ENT;  Laterality: Right;   TONSILLECTOMY      No family history on file.  Social History   Socioeconomic History   Marital status: Single    Spouse name: Not on file   Number of children: Not on file   Years of education: Not on file   Highest education level: Not on file  Occupational History   Not on file  Social Needs   Financial resource strain: Not on file   Food insecurity    Worry: Not on file    Inability: Not on file   Transportation needs    Medical: Not on file    Non-medical: Not on file  Tobacco Use   Smoking status: Current Every Day Smoker    Packs/day: 1.00    Types: Cigars   Smokeless tobacco: Never Used   Tobacco comment: former cigarette smoker  Substance and Sexual Activity   Alcohol use: Yes    Comment: rare on special occasions   Drug use: No   Sexual activity: Not Currently  Lifestyle   Physical activity    Days per week: Not on file    Minutes per session: Not on file   Stress: Not on file  Relationships   Social connections    Talks on phone: Not on file    Gets together: Not on file    Attends religious service: Not on file    Active member of club or organization: Not on file    Attends meetings of clubs or organizations: Not on file    Relationship status: Not on file   Intimate partner violence    Fear of current or ex partner: Not on file    Emotionally abused: Not on file    Physically abused: Not on file    Forced sexual activity: Not on file  Other Topics Concern   Not on file  Social History Narrative   Not on file    Past Medical History, Surgical history, Social history, and Family history were reviewed and updated as appropriate.   Please see review of systems for further details on the patient's review from today.    Review of Systems:  Review of Systems  Constitutional: Negative for chills, diaphoresis and fever.  HENT: Negative for trouble swallowing and voice change.   Respiratory: Negative for cough, chest tightness, shortness of breath and wheezing.   Cardiovascular: Negative for chest pain and palpitations.  Gastrointestinal: Negative for abdominal pain, constipation, diarrhea, nausea and vomiting.  Musculoskeletal: Negative for back pain and myalgias.  Skin:       Swelling, tenderness, and erythema at a right chest wall Port-A-Cath insertion site.  Neurological: Negative for dizziness, light-headedness and headaches.    Objective:   Physical Exam:  LMP 06/16/2019 (Exact Date)  ECOG: 0  Physical Exam Constitutional:      General: She is not in acute distress.    Appearance: Normal appearance. She is not  ill-appearing, toxic-appearing or diaphoretic.  HENT:     Head: Normocephalic and atraumatic.  Skin:      Neurological:     Mental Status: She is alert.     Coordination: Coordination normal.     Gait: Gait normal.  Psychiatric:        Mood and Affect: Mood normal.        Behavior: Behavior normal.        Thought Content: Thought content normal.        Judgment: Judgment normal.     Lab Review:     Component Value Date/Time   NA 139 06/24/2019 1329   K 3.7 06/24/2019 1329   CL 105 06/24/2019 1329   CO2 24 06/24/2019 1329   GLUCOSE 107 (H) 06/24/2019 1329   BUN 10 06/24/2019 1329   CREATININE 0.67 06/24/2019 1329   CALCIUM 9.1 06/24/2019 1329   PROT 7.4 06/24/2019 1329   ALBUMIN 3.8 06/24/2019 1329   AST 18 06/24/2019 1329   ALT 17 06/24/2019 1329   ALKPHOS 82 06/24/2019 1329   BILITOT 0.4 06/24/2019 1329   GFRNONAA >60 06/24/2019 1329   GFRAA >60 06/24/2019 1329       Component Value Date/Time   WBC 6.7 06/24/2019 1329   WBC 7.2 06/21/2019 0911   RBC 4.18 06/24/2019 1329   HGB 12.0 06/24/2019 1329   HCT 36.0 06/24/2019 1329   PLT 372 06/24/2019 1329    MCV 86.1 06/24/2019 1329   MCH 28.7 06/24/2019 1329   MCHC 33.3 06/24/2019 1329   RDW 13.2 06/24/2019 1329   LYMPHSABS 2.5 06/24/2019 1329   MONOABS 0.5 06/24/2019 1329   EOSABS 0.4 06/24/2019 1329   BASOSABS 0.0 06/24/2019 1329   -------------------------------  Imaging from last 24 hours (if applicable):  Radiology interpretation: Nm Pet Image Initial (pi) Skull Base To Thigh  Result Date: 06/11/2019 CLINICAL DATA:  Initial treatment strategy for staging of Hodgkin's lymphoma. EXAM: NUCLEAR MEDICINE PET SKULL BASE TO THIGH TECHNIQUE: 9.1 mCi F-18 FDG was injected intravenously. Full-ring PET imaging was performed from the skull base to thigh after the radiotracer. CT data was obtained and used for attenuation correction and anatomic localization. Fasting blood glucose: 102 mg/dl COMPARISON:  Neck CT 01/22/2019.  Chest radiograph 02/12/2017. FINDINGS: Mediastinal blood pool activity: SUV max 1.8 Liver activity: SUV max 3.1 NECK: Right supraclavicular hypermetabolic nodes. Example at 1.1 cm and a S.U.V. max of 7.3 on 33/4. There is also a least 1 small hypermetabolic low right jugular node. Incidental CT findings: No other cervical adenopathy. CHEST: Right subpectoral hypermetabolic nodes, including at 9 mm and a S.U.V. max of 7.2 on 44/4. Nodal conglomerate within the mediastinum, with a subcarinal component measuring 2.5 x 4.5 cm and a S.U.V. max of 15.5 on 59/4. Right cardiophrenic angle mass measures 5.1 x 3.5 cm and a S.U.V. max of 9.6 on 62/4. More inferior right cardiophrenic angle hypermetabolic nodes, including at 8 mm and a S.U.V. max of 4.8. Incidental CT findings: Nodules along the right minor fissure are likely subpleural lymph nodes, including at 3 mm on 30/8. Right lower lobe scarring and likely post infectious/inflammatory focal bronchiectasis medially. Example image 47/8. ABDOMEN/PELVIS: No abdominopelvic parenchymal or nodal hypermetabolism. Incidental CT findings: Normal adrenal  glands. No ascites. Trace free pelvic fluid is likely physiologic. SKELETON: Mild diffuse marrow hypermetabolism, without focal abnormality. Incidental CT findings: none IMPRESSION: 1. Active lymphoma within the neck and chest, as detailed above. ([place]) 4. 2. Mild hypermetabolism diffusely throughout the  marrow space. Indeterminate. Although this could be physiologic, marrow involvement cannot be excluded. Electronically Signed   By: Abigail Miyamoto M.D.   On: 06/11/2019 09:55   Ir Bone Marrow Biopsy & Aspiration  Result Date: 06/21/2019 INDICATION: 28 year old female with a history of Hodgkin lymphoma, referred for bone marrow biopsy EXAM: IMAGE GUIDED BONE MARROW BIOPSY MEDICATIONS: None. ANESTHESIA/SEDATION: Moderate (conscious) sedation was employed during this procedure. A total of Versed 4.0 mg and Fentanyl 150 mcg was administered intravenously. Moderate Sedation Time: 15 minutes. The patient's level of consciousness and vital signs were monitored continuously by radiology nursing throughout the procedure under my direct supervision. FLUOROSCOPY TIME:  Fluoroscopy Time: 0 minutes 30 seconds COMPLICATIONS: None PROCEDURE: The procedure risks, benefits, and alternatives were explained to the patient. Questions regarding the procedure were encouraged and answered. The patient understands and consents to the procedure. Scout x-ray of the pelvis was performed for surgical planning purposes. The posterior pelvis was prepped with Chlorhexidine in a sterile fashion, and a sterile drape was applied covering the operative field. A sterile gown and sterile gloves were used for the procedure. Local anesthesia was provided with 1% Lidocaine. Left posterior iliac bone was targeted for biopsy. The skin and subcutaneous tissues were infiltrated with 1% lidocaine without epinephrine. A small stab incision was made with an 11 blade scalpel, and an 11 gauge Murphy needle was advanced with fluoroscopic guidance to the  posterior cortex. Manual forced was used to advance the needle through the posterior cortex and the stylet was removed. A bone marrow aspirate was retrieved and passed to a cytotechnologist in the room. The Murphy needle was then advanced without the stylet for a core biopsy. The core biopsy was retrieved and also passed to a cytotechnologist. Manual pressure was used for hemostasis and a sterile dressing was placed. No complications were encountered no significant blood loss was encountered. Patient tolerated the procedure well and remained hemodynamically stable throughout. IMPRESSION: Status post image guided bone marrow biopsy, with tissue specimen sent to pathology for complete histopathologic analysis Signed, Dulcy Fanny. Earleen Newport, DO [facility] Radiology Electronically Signed   By: Corrie Mckusick D.O.   On: 06/21/2019 13:11   Ir Imaging Guided Port Insertion  Result Date: 06/21/2019 INDICATION: 28 year old female referred for port catheter placed EXAM: IMAGE GUIDED PORT CATHETER PLACEMENT MEDICATIONS: 900 mg Cleocin; The antibiotic was administered within an appropriate time interval prior to skin puncture. ANESTHESIA/SEDATION: Moderate (conscious) sedation was employed during this procedure. A total of Versed 1.0 mg and Fentanyl 50 mcg was administered intravenously. Moderate Sedation Time: 20 minutes. The patient's level of consciousness and vital signs were monitored continuously by radiology nursing throughout the procedure under my direct supervision. FLUOROSCOPY TIME:  Fluoroscopy Time: 0 minutes 12 seconds (2 mGy). COMPLICATIONS: None PROCEDURE: The procedure, risks, benefits, and alternatives were explained to the patient. Questions regarding the procedure were encouraged and answered. The patient understands and consents to the procedure. Ultrasound survey was performed with images stored and sent to PACs. The right neck and chest was prepped with  chlorhexidine, and draped in the usual sterile fashion using maximum barrier technique (cap and mask, sterile gown, sterile gloves, large sterile sheet, hand hygiene and cutaneous antiseptic). Antibiotic prophylaxis was provided with 900g Cleocin administered IV prior to skin incision. Local anesthesia was attained by infiltration with 1% lidocaine without epinephrine. Ultrasound demonstrated patency of the right internal jugular vein, and this was documented with an image. Under real-time ultrasound guidance, this vein  was accessed with a 21 gauge micropuncture needle and image documentation was performed. A small dermatotomy was made at the access site with an 11 scalpel. A 0.018" wire was advanced into the SVC and used to estimate the length of the internal catheter. The access needle exchanged for a 36F micropuncture vascular sheath. The 0.018" wire was then removed and a 0.035" wire advanced into the IVC. An appropriate location for the subcutaneous reservoir was selected below the clavicle and an incision was made through the skin and underlying soft tissues. The subcutaneous tissues were then dissected using a combination of blunt and sharp surgical technique and a pocket was formed. A single lumen power injectable portacatheter was then tunneled through the subcutaneous tissues from the pocket to the dermatotomy and the port reservoir placed within the subcutaneous pocket. The venous access site was then serially dilated and a peel away vascular sheath placed over the wire. The wire was removed and the port catheter advanced into position under fluoroscopic guidance. The catheter tip is positioned in the cavoatrial junction. This was documented with a spot image. The portacatheter was then tested and found to flush and aspirate well. The port was flushed with saline followed by 100 units/mL heparinized saline. The pocket was then closed in two layers using first subdermal inverted interrupted absorbable  sutures followed by a running subcuticular suture. The epidermis was then sealed with Dermabond. The dermatotomy at the venous access site was also seal with Dermabond. Patient tolerated the procedure well and remained hemodynamically stable throughout. No complications encountered and no significant blood loss encountered IMPRESSION: Status post right IJ port catheter placement. Signed, Dulcy Fanny. Dellia Nims, RPVI [facility] Radiology Electronically Signed   By: Corrie Mckusick D.O.   On: 06/21/2019 13:13

## 2019-06-28 NOTE — Telephone Encounter (Signed)
Pt called reporting swelling, tenderness, redness, and recent rapid removal of adhesive over port insertion site.  Denies fever/chills or CP/SOB.  Pt will be seen in [facility] today around 1230-1300, pt verbalized understanding.

## 2019-06-30 ENCOUNTER — Encounter: Payer: Self-pay | Admitting: *Deleted

## 2019-06-30 NOTE — Progress Notes (Signed)
[place] Work  Clinical Social Work spoke with patient by phone.  Patient reported on her first chemotherapy treatment.  Faith Pope shared she was scared prior to treatment, but it went well overall and she has had minimal side effects.  CSW and patient discussed 'sense of control' and coping through cancer.  Patient reported feeling strong and positive after first treatment.  CSW leaving [employer] card at front desk for patient to pick up.  Gwinda Maine, LCSW  Clinical Social Worker [facility]

## 2019-07-01 ENCOUNTER — Encounter: Payer: Self-pay | Admitting: Internal Medicine

## 2019-07-01 NOTE — Progress Notes (Signed)
Applied on patient's behalf for Urgent Need, Pediatric and [place] through [employer] due to reduction income treatment-related.  Patient approved for annual one-time $500 award. She will receive a check in the mail in 5-7 business days. She will receive a copy of the approval letter as well.  Called patient to advise. She was very Patent attorney.  She has my card for any additional financial questions or concerns.

## 2019-07-07 ENCOUNTER — Inpatient Hospital Stay (HOSPITAL_BASED_OUTPATIENT_CLINIC_OR_DEPARTMENT_OTHER): Payer: Self-pay | Admitting: Internal Medicine

## 2019-07-07 ENCOUNTER — Inpatient Hospital Stay: Payer: Self-pay

## 2019-07-07 ENCOUNTER — Encounter: Payer: Self-pay | Admitting: Internal Medicine

## 2019-07-07 ENCOUNTER — Other Ambulatory Visit: Payer: Self-pay

## 2019-07-07 VITALS — BP 123/63 | HR 87 | Temp 98.0°F | Resp 18 | Ht 62.0 in | Wt 186.7 lb

## 2019-07-07 DIAGNOSIS — Z5111 Encounter for antineoplastic chemotherapy: Secondary | ICD-10-CM

## 2019-07-07 DIAGNOSIS — Z95828 Presence of other vascular implants and grafts: Secondary | ICD-10-CM

## 2019-07-07 DIAGNOSIS — C8111 Nodular sclerosis classical Hodgkin lymphoma, lymph nodes of head, face, and neck: Secondary | ICD-10-CM

## 2019-07-07 LAB — CMP (CANCER CENTER ONLY)
ALT: 30 U/L (ref 0–44)
AST: 16 U/L (ref 15–41)
Albumin: 3.9 g/dL (ref 3.5–5.0)
Alkaline Phosphatase: 67 U/L (ref 38–126)
Anion gap: 9 (ref 5–15)
BUN: 15 mg/dL (ref 6–20)
CO2: 25 mmol/L (ref 22–32)
Calcium: 8.9 mg/dL (ref 8.9–10.3)
Chloride: 106 mmol/L (ref 98–111)
Creatinine: 0.69 mg/dL (ref 0.44–1.00)
GFR, Est AFR Am: >60 mL/min (ref >60)
GFR, Estimated: >60 mL/min (ref >60)
Glucose, Bld: 133 mg/dL — ABNORMAL HIGH (ref 70–99)
Potassium: 3.8 mmol/L (ref 3.5–5.1)
Sodium: 140 mmol/L (ref 135–145)
Total Bilirubin: 0.3 mg/dL (ref 0.3–1.2)
Total Protein: 7.1 g/dL (ref 6.5–8.1)

## 2019-07-07 LAB — CBC WITH DIFFERENTIAL (CANCER CENTER ONLY)
Abs Immature Granulocytes: 0.01 10*3/uL (ref 0.00–0.07)
Basophils Absolute: 0 10*3/uL (ref 0.0–0.1)
Basophils Relative: 1 %
Eosinophils Absolute: 0.3 10*3/uL (ref 0.0–0.5)
Eosinophils Relative: 8 %
HCT: 33.7 % — ABNORMAL LOW (ref 36.0–46.0)
Hemoglobin: 11 g/dL — ABNORMAL LOW (ref 12.0–15.0)
Immature Granulocytes: 0 %
Lymphocytes Relative: 43 %
Lymphs Abs: 1.9 10*3/uL (ref 0.7–4.0)
MCH: 28.6 pg (ref 26.0–34.0)
MCHC: 32.6 g/dL (ref 30.0–36.0)
MCV: 87.5 fL (ref 80.0–100.0)
Monocytes Absolute: 0.4 10*3/uL (ref 0.1–1.0)
Monocytes Relative: 10 %
Neutro Abs: 1.6 10*3/uL — ABNORMAL LOW (ref 1.7–7.7)
Neutrophils Relative %: 38 %
Platelet Count: 315 10*3/uL (ref 150–400)
RBC: 3.85 MIL/uL — ABNORMAL LOW (ref 3.87–5.11)
RDW: 13.6 % (ref 11.5–15.5)
WBC Count: 4.3 10*3/uL (ref 4.0–10.5)
nRBC: 0 % (ref 0.0–0.2)

## 2019-07-07 LAB — PREGNANCY, URINE: Preg Test, Ur: NEGATIVE

## 2019-07-07 MED ORDER — DOXORUBICIN HCL CHEMO IV INJECTION 2 MG/ML
25.0000 mg/m2 | Freq: Once | INTRAVENOUS | Status: AC
  Administered 2019-07-07: 48 mg via INTRAVENOUS
  Filled 2019-07-07: qty 24

## 2019-07-07 MED ORDER — PALONOSETRON HCL INJECTION 0.25 MG/5ML
0.2500 mg | Freq: Once | INTRAVENOUS | Status: AC
  Administered 2019-07-07: 0.25 mg via INTRAVENOUS

## 2019-07-07 MED ORDER — SODIUM CHLORIDE 0.9 % IV SOLN
10.0000 [IU]/m2 | Freq: Once | INTRAVENOUS | Status: AC
  Administered 2019-07-07: 19 [IU] via INTRAVENOUS
  Filled 2019-07-07: qty 6.33

## 2019-07-07 MED ORDER — SODIUM CHLORIDE 0.9 % IV SOLN
Freq: Once | INTRAVENOUS | Status: AC
  Administered 2019-07-07: 13:00:00 via INTRAVENOUS
  Filled 2019-07-07: qty 5

## 2019-07-07 MED ORDER — SODIUM CHLORIDE 0.9% FLUSH
10.0000 mL | INTRAVENOUS | Status: DC | PRN
  Administered 2019-07-07: 10 mL via INTRAVENOUS
  Filled 2019-07-07: qty 10

## 2019-07-07 MED ORDER — SODIUM CHLORIDE 0.9 % IV SOLN
375.0000 mg/m2 | Freq: Once | INTRAVENOUS | Status: AC
  Administered 2019-07-07: 720 mg via INTRAVENOUS
  Filled 2019-07-07: qty 72

## 2019-07-07 MED ORDER — VINBLASTINE SULFATE CHEMO INJECTION 1 MG/ML
5.7800 mg/m2 | Freq: Once | INTRAVENOUS | Status: AC
  Administered 2019-07-07: 11 mg via INTRAVENOUS
  Filled 2019-07-07: qty 11

## 2019-07-07 MED ORDER — PALONOSETRON HCL INJECTION 0.25 MG/5ML
INTRAVENOUS | Status: AC
  Filled 2019-07-07: qty 5

## 2019-07-07 MED ORDER — SODIUM CHLORIDE 0.9% FLUSH
10.0000 mL | INTRAVENOUS | Status: DC | PRN
  Administered 2019-07-07: 10 mL
  Filled 2019-07-07: qty 10

## 2019-07-07 MED ORDER — HEPARIN SOD (PORK) LOCK FLUSH 100 UNIT/ML IV SOLN
500.0000 [IU] | Freq: Once | INTRAVENOUS | Status: AC | PRN
  Administered 2019-07-07: 500 [IU]
  Filled 2019-07-07: qty 5

## 2019-07-07 MED ORDER — SODIUM CHLORIDE 0.9 % IV SOLN
Freq: Once | INTRAVENOUS | Status: AC
  Administered 2019-07-07: 13:00:00 via INTRAVENOUS
  Filled 2019-07-07: qty 250

## 2019-07-07 NOTE — Progress Notes (Signed)
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  PRIOR THERAPY: None  CURRENT THERAPY: Systemic chemotherapy with ABVD on days 1 and 15 every 4 weeks.  First dose June 23, 2019.  INTERVAL HISTORY: Faith Pope 28 y.o. female returns to the clinic today for follow-up visit.  The patient tolerated the first dose of her treatment fairly well with no significant complaints except for 1 or 2 episodes of nausea.  She denied having any chest pain, shortness of breath, cough or hemoptysis.  She denied having any fever or chills.  She has no nausea, vomiting, diarrhea or constipation.  She has no recent weight loss or night sweats.  She is here today to start day 15 of cycle #2.  MEDICAL HISTORY: Past Medical History:  Diagnosis Date   Asthma     ALLERGIES:  is allergic to other; shellfish allergy; amoxicillin; and penicillins.  MEDICATIONS:  Current Outpatient Medications  Medication Sig Dispense Refill   albuterol (VENTOLIN HFA) 108 (90 Base) MCG/ACT inhaler Inhale 1-2 puffs into the lungs every 6 (six) hours as needed for wheezing or shortness of breath.     allopurinol (ZYLOPRIM) 100 MG tablet Take 1 tablet (100 mg total) by mouth daily. 60 tablet 2   aspirin-acetaminophen-caffeine (EXCEDRIN MIGRAINE) 250-250-65 MG tablet Take 2 tablets by mouth every 6 (six) hours as needed for headache.     lidocaine-prilocaine (EMLA) cream Apply 1 application topically as needed. 30 g 0   prochlorperazine (COMPAZINE) 10 MG tablet Take 1 tablet (10 mg total) by mouth every 6 (six) hours as needed for nausea or vomiting. 30 tablet 0   sulfamethoxazole-trimethoprim (BACTRIM DS) 800-160 MG tablet Take 1 tablet by mouth 2 (two) times daily. 14 tablet 0   No current facility-administered medications for this visit.      SURGICAL HISTORY:  Past Surgical History:  Procedure Laterality Date   IR BONE MARROW BIOPSY & ASPIRATION  06/21/2019   IR IMAGING GUIDED PORT INSERTION  06/21/2019   MASS BIOPSY Right 05/11/2019   Procedure: EXCISIONAL BIOPSY OF RIGHT CERVICAL LYMPH NODE;  Surgeon: Melida Quitter, MD;  Location: [facility];  Service: ENT;  Laterality: Right;   TONSILLECTOMY      REVIEW OF SYSTEMS:  A comprehensive review of systems was negative except for: Constitutional: positive for fatigue   PHYSICAL EXAMINATION: General appearance: alert, cooperative and no distress Head: Normocephalic, without obvious abnormality, atraumatic Neck: moderate anterior cervical adenopathy, no carotid bruit, no JVD, supple, symmetrical, trachea midline and thyroid not enlarged, symmetric, no tenderness/mass/nodules Lymph nodes: Cervical adenopathy: mild Resp: clear to auscultation bilaterally and normal percussion bilaterally Back: negative, no kyphosis present, symmetric, no curvature. ROM normal. No CVA tenderness. Cardio: regular rate and rhythm, S1, S2 normal, no murmur, click, rub or gallop GI: soft, non-tender; bowel sounds normal; no masses,  no organomegaly Extremities: extremities normal, atraumatic, no cyanosis or edema  ECOG PERFORMANCE STATUS: 1 - Symptomatic but completely ambulatory  Blood pressure 123/63, pulse 87, temperature 98 F (36.7 C), temperature source Temporal, resp. rate 18, height 5' 2"  (1.575 m), weight 186 lb 11.2 oz (84.7 kg), last menstrual period 06/16/2019, SpO2 100 %.  LABORATORY DATA: Lab Results  Component Value Date   WBC 4.3 07/07/2019   HGB 11.0 (L) 07/07/2019   HCT 33.7 (L) 07/07/2019  MCV 87.5 07/07/2019   PLT 315 07/07/2019      Chemistry      Component Value Date/Time   NA 139 06/24/2019 1329   K 3.7 06/24/2019 1329   CL 105 06/24/2019 1329   CO2 24 06/24/2019 1329   BUN 10 06/24/2019 1329   CREATININE 0.67 06/24/2019 1329      Component Value Date/Time     CALCIUM 9.1 06/24/2019 1329   ALKPHOS 82 06/24/2019 1329   AST 18 06/24/2019 1329   ALT 17 06/24/2019 1329   BILITOT 0.4 06/24/2019 1329       RADIOGRAPHIC STUDIES: Nm Pet Image Initial (pi) Skull Base To Thigh  Result Date: 06/11/2019 CLINICAL DATA:  Initial treatment strategy for staging of Hodgkin's lymphoma. EXAM: NUCLEAR MEDICINE PET SKULL BASE TO THIGH TECHNIQUE: 9.1 mCi F-18 FDG was injected intravenously. Full-ring PET imaging was performed from the skull base to thigh after the radiotracer. CT data was obtained and used for attenuation correction and anatomic localization. Fasting blood glucose: 102 mg/dl COMPARISON:  Neck CT 01/22/2019.  Chest radiograph 02/12/2017. FINDINGS: Mediastinal blood pool activity: SUV max 1.8 Liver activity: SUV max 3.1 NECK: Right supraclavicular hypermetabolic nodes. Example at 1.1 cm and a S.U.V. max of 7.3 on 33/4. There is also a least 1 small hypermetabolic low right jugular node. Incidental CT findings: No other cervical adenopathy. CHEST: Right subpectoral hypermetabolic nodes, including at 9 mm and a S.U.V. max of 7.2 on 44/4. Nodal conglomerate within the mediastinum, with a subcarinal component measuring 2.5 x 4.5 cm and a S.U.V. max of 15.5 on 59/4. Right cardiophrenic angle mass measures 5.1 x 3.5 cm and a S.U.V. max of 9.6 on 62/4. More inferior right cardiophrenic angle hypermetabolic nodes, including at 8 mm and a S.U.V. max of 4.8. Incidental CT findings: Nodules along the right minor fissure are likely subpleural lymph nodes, including at 3 mm on 30/8. Right lower lobe scarring and likely post infectious/inflammatory focal bronchiectasis medially. Example image 47/8. ABDOMEN/PELVIS: No abdominopelvic parenchymal or nodal hypermetabolism. Incidental CT findings: Normal adrenal glands. No ascites. Trace free pelvic fluid is likely physiologic. SKELETON: Mild diffuse marrow hypermetabolism, without focal abnormality. Incidental CT findings: none  IMPRESSION: 1. Active lymphoma within the neck and chest, as detailed above. ([place]) 4. 2. Mild hypermetabolism diffusely throughout the marrow space. Indeterminate. Although this could be physiologic, marrow involvement cannot be excluded. Electronically Signed   By: Abigail Miyamoto M.D.   On: 06/11/2019 09:55   Ir Bone Marrow Biopsy & Aspiration  Result Date: 06/21/2019 INDICATION: 28 year old female with a history of Hodgkin lymphoma, referred for bone marrow biopsy EXAM: IMAGE GUIDED BONE MARROW BIOPSY MEDICATIONS: None. ANESTHESIA/SEDATION: Moderate (conscious) sedation was employed during this procedure. A total of Versed 4.0 mg and Fentanyl 150 mcg was administered intravenously. Moderate Sedation Time: 15 minutes. The patient's level of consciousness and vital signs were monitored continuously by radiology nursing throughout the procedure under my direct supervision. FLUOROSCOPY TIME:  Fluoroscopy Time: 0 minutes 30 seconds COMPLICATIONS: None PROCEDURE: The procedure risks, benefits, and alternatives were explained to the patient. Questions regarding the procedure were encouraged and answered. The patient understands and consents to the procedure. Scout x-ray of the pelvis was performed for surgical planning purposes. The posterior pelvis was prepped with Chlorhexidine in a sterile fashion, and a sterile drape was applied covering the operative field. A sterile gown and sterile gloves were used for the procedure. Local anesthesia was provided with 1% Lidocaine. Left posterior iliac bone was  targeted for biopsy. The skin and subcutaneous tissues were infiltrated with 1% lidocaine without epinephrine. A small stab incision was made with an 11 blade scalpel, and an 11 gauge Murphy needle was advanced with fluoroscopic guidance to the posterior cortex. Manual forced was used to advance the needle through the posterior cortex and the stylet was removed. A bone marrow aspirate was retrieved and passed to a  cytotechnologist in the room. The Murphy needle was then advanced without the stylet for a core biopsy. The core biopsy was retrieved and also passed to a cytotechnologist. Manual pressure was used for hemostasis and a sterile dressing was placed. No complications were encountered no significant blood loss was encountered. Patient tolerated the procedure well and remained hemodynamically stable throughout. IMPRESSION: Status post image guided bone marrow biopsy, with tissue specimen sent to pathology for complete histopathologic analysis Signed, Dulcy Fanny. Earleen Newport, DO [facility] Radiology Electronically Signed   By: Corrie Mckusick D.O.   On: 06/21/2019 13:11   Ir Imaging Guided Port Insertion  Result Date: 06/21/2019 INDICATION: 28 year old female referred for port catheter placed EXAM: IMAGE GUIDED PORT CATHETER PLACEMENT MEDICATIONS: 900 mg Cleocin; The antibiotic was administered within an appropriate time interval prior to skin puncture. ANESTHESIA/SEDATION: Moderate (conscious) sedation was employed during this procedure. A total of Versed 1.0 mg and Fentanyl 50 mcg was administered intravenously. Moderate Sedation Time: 20 minutes. The patient's level of consciousness and vital signs were monitored continuously by radiology nursing throughout the procedure under my direct supervision. FLUOROSCOPY TIME:  Fluoroscopy Time: 0 minutes 12 seconds (2 mGy). COMPLICATIONS: None PROCEDURE: The procedure, risks, benefits, and alternatives were explained to the patient. Questions regarding the procedure were encouraged and answered. The patient understands and consents to the procedure. Ultrasound survey was performed with images stored and sent to PACs. The right neck and chest was prepped with chlorhexidine, and draped in the usual sterile fashion using maximum barrier technique (cap and mask, sterile gown, sterile gloves, large sterile sheet, hand hygiene and  cutaneous antiseptic). Antibiotic prophylaxis was provided with 900g Cleocin administered IV prior to skin incision. Local anesthesia was attained by infiltration with 1% lidocaine without epinephrine. Ultrasound demonstrated patency of the right internal jugular vein, and this was documented with an image. Under real-time ultrasound guidance, this vein was accessed with a 21 gauge micropuncture needle and image documentation was performed. A small dermatotomy was made at the access site with an 11 scalpel. A 0.018" wire was advanced into the SVC and used to estimate the length of the internal catheter. The access needle exchanged for a 30F micropuncture vascular sheath. The 0.018" wire was then removed and a 0.035" wire advanced into the IVC. An appropriate location for the subcutaneous reservoir was selected below the clavicle and an incision was made through the skin and underlying soft tissues. The subcutaneous tissues were then dissected using a combination of blunt and sharp surgical technique and a pocket was formed. A single lumen power injectable portacatheter was then tunneled through the subcutaneous tissues from the pocket to the dermatotomy and the port reservoir placed within the subcutaneous pocket. The venous access site was then serially dilated and a peel away vascular sheath placed over the wire. The wire was removed and the port catheter advanced into position under fluoroscopic guidance. The catheter tip is positioned in the cavoatrial junction. This was documented with a spot image. The portacatheter was then tested and found to flush and aspirate well. The port was  flushed with saline followed by 100 units/mL heparinized saline. The pocket was then closed in two layers using first subdermal inverted interrupted absorbable sutures followed by a running subcuticular suture. The epidermis was then sealed with Dermabond. The dermatotomy at the venous access site was also seal with Dermabond.  Patient tolerated the procedure well and remained hemodynamically stable throughout. No complications encountered and no significant blood loss encountered IMPRESSION: Status post right IJ port catheter placement. Signed, Dulcy Fanny. Dellia Nims, RPVI [facility] Radiology Electronically Signed   By: Corrie Mckusick D.O.   On: 06/21/2019 13:13    ASSESSMENT AND PLAN: This is a very pleasant 28 years old African-American female recently diagnosed with a stage IIA Hodgkin lymphoma, nodular sclerosing subtype diagnosed in October 2020 and presented with right cervical and supraclavicular lymphadenopathy as well as right mediastinal lymphadenopathy.  The patient has no evidence of disease below the diaphragm. She does not have a bulky disease and she has no significant unfavorable risk factors. She is currently undergoing systemic chemotherapy with ABVD status post day 1 of cycle #1.  The patient tolerated the first dose of her treatment well with no concerning adverse effects. I recommended for the patient to proceed with day 15 of cycle #1 today as planned. I will see her back for follow-up visit in 2 weeks for evaluation before the next cycle of her treatment. She was advised to call immediately if she has any concerning symptoms in the interval. The patient voices understanding of current disease status and treatment options and is in agreement with the current care plan.  All questions were answered. The patient knows to call the clinic with any problems, questions or concerns. We can certainly see the patient much sooner if necessary.  I spent 10 minutes counseling the patient face to face. The total time spent in the appointment was 15 minutes.  Disclaimer: This note was dictated with voice recognition software. Similar sounding words can inadvertently be transcribed and may not be corrected upon review.

## 2019-07-07 NOTE — Patient Instructions (Signed)
[facility] Discharge Instructions for Patients Receiving Chemotherapy  Today you received the following chemotherapy agents: Doxorubicin, Vinblastine, Bleomycin and Dacarbazine.  To help prevent nausea and vomiting after your treatment, we encourage you to take your nausea medication as directed.    If you develop nausea and vomiting that is not controlled by your nausea medication, call the clinic.   BELOW ARE SYMPTOMS THAT SHOULD BE REPORTED IMMEDIATELY:  *FEVER GREATER THAN 100.5 F  *CHILLS WITH OR WITHOUT FEVER  NAUSEA AND VOMITING THAT IS NOT CONTROLLED WITH YOUR NAUSEA MEDICATION  *UNUSUAL SHORTNESS OF BREATH  *UNUSUAL BRUISING OR BLEEDING  TENDERNESS IN MOUTH AND THROAT WITH OR WITHOUT PRESENCE OF ULCERS  *URINARY PROBLEMS  *BOWEL PROBLEMS  UNUSUAL RASH Items with * indicate a potential emergency and should be followed up as soon as possible.  Feel free to call the clinic should you have any questions or concerns. The clinic phone number is [phone].  Please show the [facility] at check-in to the Emergency Department and triage nurse.

## 2019-07-07 NOTE — Patient Instructions (Signed)
Coping with Quitting Smoking  Quitting smoking is a physical and mental challenge. You will face cravings, withdrawal symptoms, and temptation. Before quitting, work with your health care provider to make a plan that can help you cope. Preparation can help you quit and keep you from giving in. How can I cope with cravings? Cravings usually last for 5-10 minutes. If you get through it, the craving will pass. Consider taking the following actions to help you cope with cravings:  Keep your mouth busy: ? Chew sugar-free gum. ? Suck on hard candies or a straw. ? Brush your teeth.  Keep your hands and body busy: ? Immediately change to a different activity when you feel a craving. ? Squeeze or play with a ball. ? Do an activity or a hobby, like making bead jewelry, practicing needlepoint, or working with wood. ? Mix up your normal routine. ? Take a short exercise break. Go for a quick walk or run up and down stairs. ? Spend time in public places where smoking is not allowed.  Focus on doing something kind or helpful for someone else.  Call a friend or family member to talk during a craving.  Join a support group.  Call a quit line, such as 1-800-QUIT-NOW.  Talk with your health care provider about medicines that might help you cope with cravings and make quitting easier for you. How can I deal with withdrawal symptoms? Your body may experience negative effects as it tries to get used to not having nicotine in the system. These effects are called withdrawal symptoms. They may include:  Feeling hungrier than normal.  Trouble concentrating.  Irritability.  Trouble sleeping.  Feeling depressed.  Restlessness and agitation.  Craving a cigarette. To manage withdrawal symptoms:  Avoid places, people, and activities that trigger your cravings.  Remember why you want to quit.  Get plenty of sleep.  Avoid coffee and other caffeinated drinks. These may worsen some of your symptoms.  How can I handle social situations? Social situations can be difficult when you are quitting smoking, especially in the first few weeks. To manage this, you can:  Avoid parties, bars, and other social situations where people might be smoking.  Avoid alcohol.  Leave right away if you have the urge to smoke.  Explain to your family and friends that you are quitting smoking. Ask for understanding and support.  Plan activities with friends or family where smoking is not an option. What are some ways I can cope with stress? Wanting to smoke may cause stress, and stress can make you want to smoke. Find ways to manage your stress. Relaxation techniques can help. For example:  Breathe slowly and deeply, in through your nose and out through your mouth.  Listen to soothing, relaxing music.  Talk with a family member or friend about your stress.  Light a candle.  Soak in a bath or take a shower.  Think about a peaceful place. What are some ways I can prevent weight gain? Be aware that many people gain weight after they quit smoking. However, not everyone does. To keep from gaining weight, have a plan in place before you quit and stick to the plan after you quit. Your plan should include:  Having healthy snacks. When you have a craving, it may help to: ? Eat plain popcorn, crunchy carrots, celery, or other cut vegetables. ? Chew sugar-free gum.  Changing how you eat: ? Eat small portion sizes at meals. ? Eat 4-6 small meals   throughout the day instead of 1-2 large meals a day. ? Be mindful when you eat. Do not watch television or do other things that might distract you as you eat.  Exercising regularly: ? Make time to exercise each day. If you do not have time for a long workout, do short bouts of exercise for 5-10 minutes several times a day. ? Do some form of strengthening exercise, like weight lifting, and some form of aerobic exercise, like running or swimming.  Drinking plenty of  water or other low-calorie or no-calorie drinks. Drink 6-8 glasses of water daily, or as much as instructed by your health care provider. Summary  Quitting smoking is a physical and mental challenge. You will face cravings, withdrawal symptoms, and temptation to smoke again. Preparation can help you as you go through these challenges.  You can cope with cravings by keeping your mouth busy (such as by chewing gum), keeping your body and hands busy, and making calls to family, friends, or a helpline for people who want to quit smoking.  You can cope with withdrawal symptoms by avoiding places where people smoke, avoiding drinks with caffeine, and getting plenty of rest.  Ask your health care provider about the different ways to prevent weight gain, avoid stress, and handle social situations. This information is not intended to replace advice given to you by your health care provider. Make sure you discuss any questions you have with your health care provider. Document Released: 08/09/2016 Document Revised: 07/25/2017 Document Reviewed: 08/09/2016 Elsevier Patient Education  2020 [employer]  

## 2019-07-11 ENCOUNTER — Encounter: Payer: Self-pay | Admitting: Medical

## 2019-07-12 ENCOUNTER — Telehealth: Payer: Self-pay

## 2019-07-12 ENCOUNTER — Ambulatory Visit (HOSPITAL_BASED_OUTPATIENT_CLINIC_OR_DEPARTMENT_OTHER): Payer: Medicaid Other | Admitting: Medical

## 2019-07-12 ENCOUNTER — Other Ambulatory Visit: Payer: Self-pay | Admitting: Medical

## 2019-07-12 ENCOUNTER — Ambulatory Visit: Payer: Medicaid Other

## 2019-07-12 DIAGNOSIS — R109 Unspecified abdominal pain: Secondary | ICD-10-CM

## 2019-07-12 DIAGNOSIS — C8100 Nodular lymphocyte predominant Hodgkin lymphoma, unspecified site: Secondary | ICD-10-CM

## 2019-07-12 DIAGNOSIS — R1012 Left upper quadrant pain: Secondary | ICD-10-CM

## 2019-07-12 NOTE — Progress Notes (Signed)
The patient contacted our office stating that she believes that her abdominal cramping secondary to her beginning her.  She states that she is feeling better and does not need to be seen today.  She was told to return to be seen as needed.  Sandi Mealy, MHS, PA-C Physician Assistant

## 2019-07-12 NOTE — Telephone Encounter (Signed)
Pt called in reference to her appointment today. Informed that she was having abdominal pain and was concerned but she stated she saw blood today and figured it was her period. Asked Pt if she knew if the blood was from her rectum or vagina. Pt. Stated she didn't know but the blood was a brownish color. Informed Sandi Mealy PA he stated if Pt wanted to cancel her appointment, that was ok but if she should notice it's rectal bleeding to give him a call. relayed message to Pt. Pt. Verbalized understanding.

## 2019-07-21 ENCOUNTER — Inpatient Hospital Stay: Payer: Self-pay

## 2019-07-21 ENCOUNTER — Telehealth: Payer: Self-pay | Admitting: Emergency Medicine

## 2019-07-21 ENCOUNTER — Encounter: Payer: Self-pay | Admitting: Physician Assistant

## 2019-07-21 ENCOUNTER — Other Ambulatory Visit: Payer: Self-pay

## 2019-07-21 ENCOUNTER — Inpatient Hospital Stay (HOSPITAL_BASED_OUTPATIENT_CLINIC_OR_DEPARTMENT_OTHER): Payer: Self-pay | Admitting: Physician Assistant

## 2019-07-21 VITALS — BP 122/85 | HR 80 | Temp 98.2°F | Resp 18 | Ht 62.0 in | Wt 190.6 lb

## 2019-07-21 DIAGNOSIS — Z95828 Presence of other vascular implants and grafts: Secondary | ICD-10-CM

## 2019-07-21 DIAGNOSIS — C8111 Nodular sclerosis classical Hodgkin lymphoma, lymph nodes of head, face, and neck: Secondary | ICD-10-CM

## 2019-07-21 DIAGNOSIS — Z5111 Encounter for antineoplastic chemotherapy: Secondary | ICD-10-CM

## 2019-07-21 DIAGNOSIS — C8100 Nodular lymphocyte predominant Hodgkin lymphoma, unspecified site: Secondary | ICD-10-CM

## 2019-07-21 LAB — CMP (CANCER CENTER ONLY)
ALT: 29 U/L (ref 0–44)
AST: 22 U/L (ref 15–41)
Albumin: 3.7 g/dL (ref 3.5–5.0)
Alkaline Phosphatase: 69 U/L (ref 38–126)
Anion gap: 9 (ref 5–15)
BUN: 11 mg/dL (ref 6–20)
CO2: 24 mmol/L (ref 22–32)
Calcium: 9.1 mg/dL (ref 8.9–10.3)
Chloride: 107 mmol/L (ref 98–111)
Creatinine: 0.64 mg/dL (ref 0.44–1.00)
GFR, Est AFR Am: >60 mL/min (ref >60)
GFR, Estimated: >60 mL/min (ref >60)
Glucose, Bld: 97 mg/dL (ref 70–99)
Potassium: 3.9 mmol/L (ref 3.5–5.1)
Sodium: 140 mmol/L (ref 135–145)
Total Bilirubin: 0.3 mg/dL (ref 0.3–1.2)
Total Protein: 6.7 g/dL (ref 6.5–8.1)

## 2019-07-21 LAB — CBC WITH DIFFERENTIAL (CANCER CENTER ONLY)
Abs Immature Granulocytes: 0.02 10*3/uL (ref 0.00–0.07)
Basophils Absolute: 0 10*3/uL (ref 0.0–0.1)
Basophils Relative: 1 %
Eosinophils Absolute: 0.4 10*3/uL (ref 0.0–0.5)
Eosinophils Relative: 7 %
HCT: 33.2 % — ABNORMAL LOW (ref 36.0–46.0)
Hemoglobin: 11 g/dL — ABNORMAL LOW (ref 12.0–15.0)
Immature Granulocytes: 0 %
Lymphocytes Relative: 45 %
Lymphs Abs: 2.5 10*3/uL (ref 0.7–4.0)
MCH: 28.7 pg (ref 26.0–34.0)
MCHC: 33.1 g/dL (ref 30.0–36.0)
MCV: 86.7 fL (ref 80.0–100.0)
Monocytes Absolute: 0.7 10*3/uL (ref 0.1–1.0)
Monocytes Relative: 13 %
Neutro Abs: 1.9 10*3/uL (ref 1.7–7.7)
Neutrophils Relative %: 34 %
Platelet Count: 256 10*3/uL (ref 150–400)
RBC: 3.83 MIL/uL — ABNORMAL LOW (ref 3.87–5.11)
RDW: 14 % (ref 11.5–15.5)
WBC Count: 5.5 10*3/uL (ref 4.0–10.5)
nRBC: 0 % (ref 0.0–0.2)

## 2019-07-21 LAB — PREGNANCY, URINE: Preg Test, Ur: NEGATIVE

## 2019-07-21 MED ORDER — SODIUM CHLORIDE 0.9 % IV SOLN
10.0000 [IU]/m2 | Freq: Once | INTRAVENOUS | Status: AC
  Administered 2019-07-21: 14:00:00 19 [IU] via INTRAVENOUS
  Filled 2019-07-21: qty 6.33

## 2019-07-21 MED ORDER — VINBLASTINE SULFATE CHEMO INJECTION 1 MG/ML
6.0000 mg/m2 | Freq: Once | INTRAVENOUS | Status: AC
  Administered 2019-07-21: 11.5 mg via INTRAVENOUS
  Filled 2019-07-21: qty 11.5

## 2019-07-21 MED ORDER — DOXORUBICIN HCL CHEMO IV INJECTION 2 MG/ML
25.0000 mg/m2 | Freq: Once | INTRAVENOUS | Status: AC
  Administered 2019-07-21: 48 mg via INTRAVENOUS
  Filled 2019-07-21: qty 24

## 2019-07-21 MED ORDER — SODIUM CHLORIDE 0.9% FLUSH
10.0000 mL | INTRAVENOUS | Status: DC | PRN
  Administered 2019-07-21: 10 mL via INTRAVENOUS
  Filled 2019-07-21: qty 10

## 2019-07-21 MED ORDER — PALONOSETRON HCL INJECTION 0.25 MG/5ML
INTRAVENOUS | Status: AC
  Filled 2019-07-21: qty 5

## 2019-07-21 MED ORDER — SODIUM CHLORIDE 0.9% FLUSH
10.0000 mL | INTRAVENOUS | Status: DC | PRN
  Administered 2019-07-21: 10 mL
  Filled 2019-07-21: qty 10

## 2019-07-21 MED ORDER — SODIUM CHLORIDE 0.9 % IV SOLN
Freq: Once | INTRAVENOUS | Status: AC
  Administered 2019-07-21: 12:00:00 via INTRAVENOUS
  Filled 2019-07-21: qty 250

## 2019-07-21 MED ORDER — PALONOSETRON HCL INJECTION 0.25 MG/5ML
0.2500 mg | Freq: Once | INTRAVENOUS | Status: AC
  Administered 2019-07-21: 0.25 mg via INTRAVENOUS

## 2019-07-21 MED ORDER — HEPARIN SOD (PORK) LOCK FLUSH 100 UNIT/ML IV SOLN
500.0000 [IU] | Freq: Once | INTRAVENOUS | Status: AC | PRN
  Administered 2019-07-21: 500 [IU]
  Filled 2019-07-21: qty 5

## 2019-07-21 MED ORDER — SODIUM CHLORIDE 0.9 % IV SOLN
375.0000 mg/m2 | Freq: Once | INTRAVENOUS | Status: AC
  Administered 2019-07-21: 720 mg via INTRAVENOUS
  Filled 2019-07-21: qty 72

## 2019-07-21 MED ORDER — SODIUM CHLORIDE 0.9 % IV SOLN
Freq: Once | INTRAVENOUS | Status: AC
  Administered 2019-07-21: 13:00:00 via INTRAVENOUS
  Filled 2019-07-21: qty 5

## 2019-07-21 NOTE — Telephone Encounter (Signed)
Pt called reporting 'black tongue'/darkened pigment of tongue starting over last few days.  Denies any pain, oral swelling, injury, changes in diet, or any other issues/symptoms of changes in dentition.  MD [place] out of office at this time.  Spoke with PA Lucianne Lei who recommended watching for changes over next few days and to f/u as needed, and to use salt water rinses.  Pt verbalized understanding of instructions, denies any further questions/concerns at this time.

## 2019-07-21 NOTE — Progress Notes (Signed)
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  PRIOR THERAPY: None  CURRENT THERAPY: Systemic chemotherapy with ABVD on days 1 and 15 every 4 weeks.  First dose June 23, 2019. Status post 1 cycle.   INTERVAL HISTORY: Faith Pope 28 y.o. female returns to the clinic for a follow up visit.The patient is feeling well today without any concerning complaints. The patient continues to tolerate treatment with AVBD fairly well. The day after her last treatment, she noted some abdominal cramping/pain that she described as a twisting sensation. This lasted for about 3-4 days but she is unsure if it was related to her menses. Otherwise, she also noted a few small scattered bumps underneath her palms and soles. These are not pruritic and are only tender to touch. Denies any fever, chills, night sweats, or weight loss. She believes her right supraclavicular lymphadenopathy is decreasing in size. She reports her baseline cough and wheezing particularly at night time. Denies any chest pain or hemoptysis. Denies any nausea, vomiting, diarrhea, or constipation. Denies any headache or visual changes. She denies any unusual bleeding or bruising including melena, hematochezia, epistaxis, or gingival bleeding. The patient is here today for evaluation prior to starting cycle # 2   MEDICAL HISTORY: Past Medical History:  Diagnosis Date  . Asthma     ALLERGIES:  is allergic to other; shellfish allergy; amoxicillin; and penicillins.  MEDICATIONS:  Current Outpatient Medications  Medication Sig Dispense Refill  . albuterol (VENTOLIN HFA) 108 (90 Base) MCG/ACT inhaler Inhale 1-2 puffs into the lungs every 6 (six) hours as needed for wheezing or shortness of breath.    . allopurinol (ZYLOPRIM) 100 MG tablet Take 1 tablet (100 mg total) by mouth daily. 60 tablet 2  .  aspirin-acetaminophen-caffeine (EXCEDRIN MIGRAINE) 250-250-65 MG tablet Take 2 tablets by mouth every 6 (six) hours as needed for headache.    . lidocaine-prilocaine (EMLA) cream Apply 1 application topically as needed. 30 g 0  . prochlorperazine (COMPAZINE) 10 MG tablet Take 1 tablet (10 mg total) by mouth every 6 (six) hours as needed for nausea or vomiting. 30 tablet 0  . sulfamethoxazole-trimethoprim (BACTRIM DS) 800-160 MG tablet Take 1 tablet by mouth 2 (two) times daily. 14 tablet 0   No current facility-administered medications for this visit.    Facility-Administered Medications Ordered in Other Visits  Medication Dose Route Frequency Provider Last Rate Last Dose  . bleomycin (BLEOCIN) 19 Units in sodium chloride 0.9 % 50 mL chemo infusion  10 Units/m2 (Treatment Plan Recorded) Intravenous Once Curt Bears, MD      . dacarbazine (DTIC) 720 mg in sodium chloride 0.9 % 250 mL chemo infusion  375 mg/m2 (Treatment Plan Recorded) Intravenous Once Curt Bears, MD      . DOXOrubicin (ADRIAMYCIN) chemo injection 48 mg  25 mg/m2 (Treatment Plan Recorded) Intravenous Once Curt Bears, MD      . fosaprepitant (EMEND) 150 mg, dexamethasone (DECADRON) 12 mg in sodium chloride 0.9 % 145 mL IVPB   Intravenous Once Curt Bears, MD 454 mL/hr at 07/21/19 1247    . heparin lock flush 100 unit/mL  500 Units Intracatheter Once PRN Curt Bears, MD      . sodium chloride flush (NS) 0.9 % injection 10 mL  10 mL Intracatheter PRN Curt Bears, MD      . vinBLAStine (VELBAN) 11.5 mg in sodium chloride 0.9 %  50 mL chemo infusion  6 mg/m2 (Treatment Plan Recorded) Intravenous Once Curt Bears, MD        SURGICAL HISTORY:  Past Surgical History:  Procedure Laterality Date  . IR BONE MARROW BIOPSY & ASPIRATION  06/21/2019  . IR IMAGING GUIDED PORT INSERTION  06/21/2019  . MASS BIOPSY Right 05/11/2019   Procedure: EXCISIONAL BIOPSY OF RIGHT CERVICAL LYMPH NODE;  Surgeon: Melida Quitter, MD;  Location: [facility];  Service: ENT;  Laterality: Right;  . TONSILLECTOMY      REVIEW OF SYSTEMS:   Review of Systems  Constitutional: Negative for appetite change, chills, fatigue, fever and unexpected weight change.  HENT: Negative for mouth sores, nosebleeds, sore throat and trouble swallowing.   Eyes: Negative for eye problems and icterus.  Respiratory: Positive for baseline cough and wheezing espectially at night. Negative for hemoptysis and wheezing.   Cardiovascular: Negative for chest pain and leg swelling.  Gastrointestinal: Negative for abdominal pain, constipation, diarrhea, nausea and vomiting.  Genitourinary: Negative for bladder incontinence, difficulty urinating, dysuria, frequency and hematuria.   Musculoskeletal: Negative for back pain, gait problem, neck pain and neck stiffness.  Skin: Positive for occasional red bumps on her palms and soles. Negative for itching and rash.  Neurological: Negative for dizziness, extremity weakness, gait problem, headaches, light-headedness and seizures.  Hematological: Decreasing right cervical lymphadenopathy. Does not bruise/bleed easily.  Psychiatric/Behavioral: Negative for confusion, depression and sleep disturbance. The patient is not nervous/anxious.     PHYSICAL EXAMINATION:  Blood pressure 122/85, pulse 80, temperature 98.2 F (36.8 C), temperature source Oral, resp. rate 18, height 5' 2"  (1.575 m), weight 190 lb 9.6 oz (86.5 kg), SpO2 99 %.  ECOG PERFORMANCE STATUS: 1 - Symptomatic but completely ambulatory  Physical Exam  Constitutional: Oriented to person, place, and time and well-developed, well-nourished, and in no distress.  HENT:  Head: Normocephalic and atraumatic.  Mouth/Throat: Oropharynx is clear and moist. No oropharyngeal exudate.  Eyes: Conjunctivae are normal. Right eye exhibits no discharge. Left eye exhibits no discharge. No scleral icterus.  Neck: Normal range of motion. Neck supple.   Cardiovascular: Normal rate, regular rhythm, normal heart sounds and intact distal pulses.   Pulmonary/Chest: Effort normal and breath sounds normal. No respiratory distress. No wheezes. No rales.  Abdominal: Soft. Bowel sounds are normal. Exhibits no distension and no mass. There is no tenderness.  Musculoskeletal: Normal range of motion. Exhibits no edema.  Lymphadenopathy:    Decreasing right cervical lymphadenopathy. Neurological: Alert and oriented to person, place, and time. Exhibits normal muscle tone. Gait normal. Coordination normal.  Skin: Skin is warm and dry. No rash noted. Not diaphoretic. No erythema. No pallor.  Psychiatric: Mood, memory and judgment normal.  Vitals reviewed.  LABORATORY DATA: Lab Results  Component Value Date   WBC 5.5 07/21/2019   HGB 11.0 (L) 07/21/2019   HCT 33.2 (L) 07/21/2019   MCV 86.7 07/21/2019   PLT 256 07/21/2019      Chemistry      Component Value Date/Time   NA 140 07/21/2019 1050   K 3.9 07/21/2019 1050   CL 107 07/21/2019 1050   CO2 24 07/21/2019 1050   BUN 11 07/21/2019 1050   CREATININE 0.64 07/21/2019 1050      Component Value Date/Time   CALCIUM 9.1 07/21/2019 1050   ALKPHOS 69 07/21/2019 1050   AST 22 07/21/2019 1050   ALT 29 07/21/2019 1050   BILITOT 0.3 07/21/2019 1050       RADIOGRAPHIC STUDIES:  Ir Bone Marrow Biopsy & Aspiration  Result Date: 06/21/2019 INDICATION: 28 year old female with a history of Hodgkin lymphoma, referred for bone marrow biopsy EXAM: IMAGE GUIDED BONE MARROW BIOPSY MEDICATIONS: None. ANESTHESIA/SEDATION: Moderate (conscious) sedation was employed during this procedure. A total of Versed 4.0 mg and Fentanyl 150 mcg was administered intravenously. Moderate Sedation Time: 15 minutes. The patient's level of consciousness and vital signs were monitored continuously by radiology nursing throughout the procedure under my direct supervision. FLUOROSCOPY TIME:  Fluoroscopy Time: 0 minutes 30 seconds  COMPLICATIONS: None PROCEDURE: The procedure risks, benefits, and alternatives were explained to the patient. Questions regarding the procedure were encouraged and answered. The patient understands and consents to the procedure. Scout x-ray of the pelvis was performed for surgical planning purposes. The posterior pelvis was prepped with Chlorhexidine in a sterile fashion, and a sterile drape was applied covering the operative field. A sterile gown and sterile gloves were used for the procedure. Local anesthesia was provided with 1% Lidocaine. Left posterior iliac bone was targeted for biopsy. The skin and subcutaneous tissues were infiltrated with 1% lidocaine without epinephrine. A small stab incision was made with an 11 blade scalpel, and an 11 gauge Murphy needle was advanced with fluoroscopic guidance to the posterior cortex. Manual forced was used to advance the needle through the posterior cortex and the stylet was removed. A bone marrow aspirate was retrieved and passed to a cytotechnologist in the room. The Murphy needle was then advanced without the stylet for a core biopsy. The core biopsy was retrieved and also passed to a cytotechnologist. Manual pressure was used for hemostasis and a sterile dressing was placed. No complications were encountered no significant blood loss was encountered. Patient tolerated the procedure well and remained hemodynamically stable throughout. IMPRESSION: Status post image guided bone marrow biopsy, with tissue specimen sent to pathology for complete histopathologic analysis Signed, Dulcy Fanny. Earleen Newport, DO [facility] Radiology Electronically Signed   By: Corrie Mckusick D.O.   On: 06/21/2019 13:11     ASSESSMENT/PLAN:  This is a very pleasant 28 years old African-American female recently diagnosed with a stage IIA Hodgkin lymphoma, nodular sclerosing subtype diagnosed in October 2020 and presented with right cervical and  supraclavicular lymphadenopathy as well as right mediastinal lymphadenopathy.  The patient has no evidence of disease below the diaphragm.  She does not have a bulky disease and she has no significant unfavorable risk factors. She is currently undergoing systemic chemotherapy with ABVD status post day 1 of cycle #1.  The patient tolerated the first dose of her treatment well with no concerning adverse effects.  The patient was seen with Dr. Julien Nordmann today. Labs were reviewed. We recommend that she proceed with cycle #2 today as scheduled.   I will arrange for the patient to have a restaging PET scan before starting cycle #3.   We will see the patient back for a follow up visit in 2 weeks for evaluation before starting day 15 cycle #2 and to review his PET scan.   The patient was advised to call immediately if she has any concerning symptoms in the interval. The patient voices understanding of current disease status and treatment options and is in agreement with the current care plan. All questions were answered. The patient knows to call the clinic with any problems, questions or concerns. We can certainly see the patient much sooner if necessary   Orders Placed This Encounter  Procedures  . NM PET Image  Restag (PS) Skull Base To Thigh    Standing Status:   Future    Standing Expiration Date:   07/20/2020    Order Specific Question:   ** REASON FOR EXAM (FREE TEXT)    Answer:   Restaging Lymphoma    Order Specific Question:   If indicated for the ordered procedure, I authorize the administration of a radiopharmaceutical per Radiology protocol    Answer:   Yes    Order Specific Question:   Is the patient pregnant?    Answer:   No    Order Specific Question:   Radiology Contrast Protocol - do NOT remove file path    Answer:   [email or web address]     Cassandra L Heilingoetter, PA-C 07/21/19  ADDENDUM: Hematology/Oncology Attending: I had a face-to-face  encounter with the patient today.  I recommended her care plan.  This is a very pleasant 28 years old African-American female recently diagnosed with Hodgkin's lymphoma, nodular sclerosing subtype and currently undergoing treatment with ABVD status post 1 cycle.  The patient tolerated the first cycle of her treatment well with no concerning adverse effects. She is here today for evaluation before starting day 1 of cycle #2. The patient will come back for follow-up visit in 2 weeks for evaluation before starting day 15 of cycle #2. We will arrange for her to have restaging PET scan in 4 weeks. The patient was advised to call immediately if she has any concerning symptoms in the interval.  Disclaimer: This note was dictated with voice recognition software. Similar sounding words can inadvertently be transcribed and may be missed upon review. Eilleen Kempf, MD 07/21/19

## 2019-07-21 NOTE — Patient Instructions (Signed)

## 2019-07-21 NOTE — Patient Instructions (Signed)
[facility] Discharge Instructions for Patients Receiving Chemotherapy  Today you received the following chemotherapy agents: Doxorubicin, Vinblastine, Bleomycin and Dacarbazine.  To help prevent nausea and vomiting after your treatment, we encourage you to take your nausea medication as directed.    If you develop nausea and vomiting that is not controlled by your nausea medication, call the clinic.   BELOW ARE SYMPTOMS THAT SHOULD BE REPORTED IMMEDIATELY:  *FEVER GREATER THAN 100.5 F  *CHILLS WITH OR WITHOUT FEVER  NAUSEA AND VOMITING THAT IS NOT CONTROLLED WITH YOUR NAUSEA MEDICATION  *UNUSUAL SHORTNESS OF BREATH  *UNUSUAL BRUISING OR BLEEDING  TENDERNESS IN MOUTH AND THROAT WITH OR WITHOUT PRESENCE OF ULCERS  *URINARY PROBLEMS  *BOWEL PROBLEMS  UNUSUAL RASH Items with * indicate a potential emergency and should be followed up as soon as possible.  Feel free to call the clinic should you have any questions or concerns. The clinic phone number is [phone].  Please show the [facility] at check-in to the Emergency Department and triage nurse.

## 2019-07-23 ENCOUNTER — Telehealth: Payer: Self-pay | Admitting: Physician Assistant

## 2019-07-23 NOTE — Telephone Encounter (Signed)
Scheduled per los. Called and left msg. Mailed printout  °

## 2019-07-27 ENCOUNTER — Telehealth: Payer: Self-pay | Admitting: *Deleted

## 2019-07-27 NOTE — Telephone Encounter (Signed)
Patient agreed to coming into [facility] tomorrow am to evaluate pain and further description of having a harder time walking because of the pain on the left side and left abdomen.  She agreed to come into the clinic.  Scheduling message routed.

## 2019-07-27 NOTE — Telephone Encounter (Signed)
Patient called to question left back pain and left abdomen pain she was having and wasn't sure if it could be related to the Bone Marrow Biopsy from 06/21/2019.  Questioned hydration status, reports drinking approximately 2 bottles of water daily, denies nausea/vomiting,no odor to urine, no blood in urine, no discoloration to urine.  Routed to [employer], [place] to review and advise further instructions.

## 2019-07-28 ENCOUNTER — Other Ambulatory Visit: Payer: Self-pay

## 2019-07-28 ENCOUNTER — Inpatient Hospital Stay: Payer: Self-pay | Attending: Physician Assistant | Admitting: Medical

## 2019-07-28 VITALS — BP 120/85 | HR 108 | Temp 98.5°F | Resp 18 | Ht 62.0 in | Wt 191.9 lb

## 2019-07-28 DIAGNOSIS — Z5111 Encounter for antineoplastic chemotherapy: Secondary | ICD-10-CM | POA: Insufficient documentation

## 2019-07-28 DIAGNOSIS — C8111 Nodular sclerosis classical Hodgkin lymphoma, lymph nodes of head, face, and neck: Secondary | ICD-10-CM | POA: Insufficient documentation

## 2019-07-28 DIAGNOSIS — F17210 Nicotine dependence, cigarettes, uncomplicated: Secondary | ICD-10-CM | POA: Insufficient documentation

## 2019-07-28 DIAGNOSIS — R1012 Left upper quadrant pain: Secondary | ICD-10-CM | POA: Insufficient documentation

## 2019-07-28 DIAGNOSIS — K219 Gastro-esophageal reflux disease without esophagitis: Secondary | ICD-10-CM | POA: Insufficient documentation

## 2019-07-28 DIAGNOSIS — K2900 Acute gastritis without bleeding: Secondary | ICD-10-CM

## 2019-07-28 MED ORDER — OMEPRAZOLE 40 MG PO CPDR: 40.0000 mg | DELAYED_RELEASE_CAPSULE | Freq: Every day | ORAL | 5 refills | Status: DC

## 2019-07-28 MED FILL — OMEPRAZOLE 40 MG CPDR: 40 | 30 days supply | Qty: 30 | Fill #0

## 2019-07-28 NOTE — Progress Notes (Signed)
[facility] Progress Note   Faith Pope [id number] 1990/12/02 28 y.o.  Faith Pope is managed by Dr. Fanny Bien. Faith Pope  Actively treated with chemotherapy/immunotherapy/hormonal therapy: yes  Current therapy: Systemic chemotherapy with ABVD on days 1 and 15 every 4 weeks.   Last treated: 07/21/2019 (cycle 2, day 1)  Next scheduled appointment with provider: 08/04/2019  Assessment: Plan:    Gastroesophageal reflux disease, unspecified whether esophagitis present - Plan: omeprazole (PRILOSEC) 40 MG capsule  Other acute gastritis, presence of bleeding unspecified - Plan: omeprazole (PRILOSEC) 40 MG capsule  Nodular sclerosis Hodgkin lymphoma of lymph nodes of neck ([place])   GERD and suspected gastritis: The patient was told to limit her consumption of acid-containing drinks.  She was also placed on Prilosec 40 mg once daily.  Nodular sclerosing Hodgkin's disease: The patient continues to be followed by Dr. Fanny Bien. Faith Pope and is status post cycle 2-day 1 of ABVD chemotherapy.  She is scheduled to return for follow-up on 08/04/2019.  Please see After Visit Summary for patient specific instructions.  Future Appointments  Date Time Provider [place]  08/04/2019 10:45 AM [facility] LAB 4 [facility] None  08/04/2019 11:00 AM [facility] [place] FLUSH [facility] None  08/04/2019 11:30 AM Curt Bears, MD [facility] None  08/04/2019 12:15 PM [facility] INFUSION [facility] None  08/23/2019 11:00 AM [facility] LAB 1 [facility] None  08/23/2019 11:15 AM [facility] [place] FLUSH [facility] None  08/23/2019 11:45 AM Curt Bears, MD [facility] None  08/23/2019 12:45 PM [facility] INFUSION [facility] None  09/06/2019  8:45 AM [facility] LAB 1 [facility] None  09/06/2019  9:00 AM [facility] [place] FLUSH [facility] None  09/06/2019  9:30 AM Heilingoetter, Cassandra L, PA-C [facility] None  09/06/2019 10:15 AM [facility] INFUSION [facility] None    No orders of  the defined types were placed in this encounter.      Subjective:   Patient ID:  Faith Pope is a 29 y.o. (DOB 04-25-1991) female.  Chief Complaint:  Chief Complaint  Patient presents with  . Abdominal Pain    HPI Faith Pope Is a 28 y.o. female with a diagnosis of a stage IIA nonbulky nodular sclerosing Hodgkin lymphoma diagnosed in October 2020.  She is managed by Dr. Julien Pope and is status post cycle 2, day 1 of ABVD chemotherapy which was dosed on 07/21/2019.  She presents to the clinic today with a report of left upper quadrant abdominal pain and GERD.  She reports that she drinks a lot of orange juice.  She has not been taking anything for GERD.  She has been having increased belching but denies nausea or vomiting.  Medications: I have reviewed the patient's current medications.  Allergies:  Allergies  Allergen Reactions  . Other Other (See Comments)    Mouth burns and tongue numb  . Shellfish Allergy Other (See Comments)    Mouth burns and tongue numb  . Amoxicillin Rash  . Penicillins Rash    Has patient had a PCN reaction causing immediate rash, facial/tongue/throat swelling, SOB or lightheadedness with hypotension: rash Has patient had a PCN reaction causing severe rash involving mucus membranes or skin necrosis: No Has patient had a PCN reaction that required hospitalization: No Has patient had a PCN reaction occurring within the last 10 years: No If all of the above answers are "NO", then may proceed with Cephalosporin use.    Past Medical History:  Diagnosis Date  . Asthma     Past Surgical History:  Procedure Laterality Date  .  IR BONE MARROW BIOPSY & ASPIRATION  06/21/2019  . IR IMAGING GUIDED PORT INSERTION  06/21/2019  . MASS BIOPSY Right 05/11/2019   Procedure: EXCISIONAL BIOPSY OF RIGHT CERVICAL LYMPH NODE;  Surgeon: Melida Quitter, MD;  Location: [facility];  Service: ENT;  Laterality: Right;  . TONSILLECTOMY      No family history on file.  Social  History   Socioeconomic History  . Marital status: Single    Spouse name: Not on file  . Number of children: Not on file  . Years of education: Not on file  . Highest education level: Not on file  Occupational History  . Not on file  Social Needs  . Financial resource strain: Not on file  . Food insecurity    Worry: Not on file    Inability: Not on file  . Transportation needs    Medical: Not on file    Non-medical: Not on file  Tobacco Use  . Smoking status: Current Every Day Smoker    Packs/day: 1.00    Types: Cigars  . Smokeless tobacco: Never Used  . Tobacco comment: former cigarette smoker  Substance and Sexual Activity  . Alcohol use: Yes    Comment: rare on special occasions  . Drug use: No  . Sexual activity: Not Currently  Lifestyle  . Physical activity    Days per week: Not on file    Minutes per session: Not on file  . Stress: Not on file  Relationships  . Social Herbalist on phone: Not on file    Gets together: Not on file    Attends religious service: Not on file    Active member of club or organization: Not on file    Attends meetings of clubs or organizations: Not on file    Relationship status: Not on file  . Intimate partner violence    Fear of current or ex partner: Not on file    Emotionally abused: Not on file    Physically abused: Not on file    Forced sexual activity: Not on file  Other Topics Concern  . Not on file  Social History Narrative  . Not on file    Past Medical History, Surgical history, Social history, and Family history were reviewed and updated as appropriate.   Please see review of systems for further details on the patient's review from today.   Review of Systems:  Review of Systems  Constitutional: Negative for activity change, appetite change, chills, diaphoresis and fever.  Respiratory: Negative for cough, chest tightness and shortness of breath.   Cardiovascular: Negative for chest pain, palpitations and  leg swelling.  Gastrointestinal: Positive for abdominal pain. Negative for abdominal distention, constipation, diarrhea, nausea and vomiting.    Objective:   Physical Exam:  BP 120/85 (BP Location: Right Arm, Patient Position: Sitting)   Pulse (!) 108 Comment: Tanner PA is aware  Temp 98.5 F (36.9 C) (Temporal)   Resp 18   Ht _0  (1.575 m)   Wt 191 lb 14.4 oz (87 kg)   SpO2 100%   BMI 35.10 kg/m  ECOG: 0  Physical Exam Constitutional:      General: She is not in acute distress.    Appearance: She is not diaphoretic.  HENT:     Head: Normocephalic and atraumatic.     Mouth/Throat:     Pharynx: No oropharyngeal exudate.  Neck:     Musculoskeletal: Normal range of motion and neck supple.  Cardiovascular:     Rate and Rhythm: Normal rate and regular rhythm.     Heart sounds: Normal heart sounds. No murmur. No friction rub. No gallop.   Pulmonary:     Effort: Pulmonary effort is normal. No respiratory distress.     Breath sounds: Normal breath sounds. No wheezing or rales.  Abdominal:     General: Bowel sounds are normal. There is no distension.     Palpations: Abdomen is soft. There is no mass.     Tenderness: There is abdominal tenderness in the left upper quadrant. There is no guarding or rebound.  Lymphadenopathy:     Cervical: No cervical adenopathy.  Skin:    General: Skin is warm and dry.     Findings: No erythema or rash.  Neurological:     Mental Status: She is alert.     Coordination: Coordination normal.  Psychiatric:        Behavior: Behavior normal.        Thought Content: Thought content normal.        Judgment: Judgment normal.     Lab Review:     Component Value Date/Time   NA 140 07/21/2019 1050   K 3.9 07/21/2019 1050   CL 107 07/21/2019 1050   CO2 24 07/21/2019 1050   GLUCOSE 97 07/21/2019 1050   BUN 11 07/21/2019 1050   CREATININE 0.64 07/21/2019 1050   CALCIUM 9.1 07/21/2019 1050   PROT 6.7 07/21/2019 1050   ALBUMIN 3.7 07/21/2019  1050   AST 22 07/21/2019 1050   ALT 29 07/21/2019 1050   ALKPHOS 69 07/21/2019 1050   BILITOT 0.3 07/21/2019 1050   GFRNONAA >60 07/21/2019 1050   GFRAA >60 07/21/2019 1050       Component Value Date/Time   WBC 5.5 07/21/2019 1050   WBC 7.2 06/21/2019 0911   RBC 3.83 (L) 07/21/2019 1050   HGB 11.0 (L) 07/21/2019 1050   HCT 33.2 (L) 07/21/2019 1050   PLT 256 07/21/2019 1050   MCV 86.7 07/21/2019 1050   MCH 28.7 07/21/2019 1050   MCHC 33.1 07/21/2019 1050   RDW 14.0 07/21/2019 1050   LYMPHSABS 2.5 07/21/2019 1050   MONOABS 0.7 07/21/2019 1050   EOSABS 0.4 07/21/2019 1050   BASOSABS 0.0 07/21/2019 1050   -------------------------------  Imaging from last 24 hours (if applicable):  Radiology interpretation: No results found.

## 2019-07-28 NOTE — Patient Instructions (Signed)
COVID-19: How to Protect Yourself and Others Know how it spreads  There is currently no vaccine to prevent coronavirus disease 2019 (COVID-19).  The best way to prevent illness is to avoid being exposed to this virus.  The virus is thought to spread mainly from person-to-person. ? Between people who are in close contact with one another (within about 6 feet). ? Through respiratory droplets produced when an infected person coughs, sneezes or talks. ? These droplets can land in the mouths or noses of people who are nearby or possibly be inhaled into the lungs. ? Some recent studies have suggested that COVID-19 may be spread by people who are not showing symptoms. Everyone should Clean your hands often  Wash your hands often with soap and water for at least 20 seconds especially after you have been in a public place, or after blowing your nose, coughing, or sneezing.  If soap and water are not readily available, use a hand sanitizer that contains at least 60% alcohol. Cover all surfaces of your hands and rub them together until they feel dry.  Avoid touching your eyes, nose, and mouth with unwashed hands. Avoid close contact  Stay home if you are sick.  Avoid close contact with people who are sick.  Put distance between yourself and other people. ? Remember that some people without symptoms may be able to spread virus. ? This is especially important for people who are at higher risk of getting very sick.www.cdc.gov/coronavirus/2019-ncov/need-extra-precautions/people-at-higher-risk.html Cover your mouth and nose with a cloth face cover when around others  You could spread COVID-19 to others even if you do not feel sick.  Everyone should wear a cloth face cover when they have to go out in public, for example to the grocery store or to pick up other necessities. ? Cloth face coverings should not be placed on young children under age 2, anyone who has trouble breathing, or is unconscious,  incapacitated or otherwise unable to remove the mask without assistance.  The cloth face cover is meant to protect other people in case you are infected.  Do NOT use a facemask meant for a healthcare worker.  Continue to keep about 6 feet between yourself and others. The cloth face cover is not a substitute for social distancing. Cover coughs and sneezes  If you are in a private setting and do not have on your cloth face covering, remember to always cover your mouth and nose with a tissue when you cough or sneeze or use the inside of your elbow.  Throw used tissues in the trash.  Immediately wash your hands with soap and water for at least 20 seconds. If soap and water are not readily available, clean your hands with a hand sanitizer that contains at least 60% alcohol. Clean and disinfect  Clean AND disinfect frequently touched surfaces daily. This includes tables, doorknobs, light switches, countertops, handles, desks, phones, keyboards, toilets, faucets, and sinks. www.cdc.gov/coronavirus/2019-ncov/prevent-getting-sick/disinfecting-your-home.html  If surfaces are dirty, clean them: Use detergent or soap and water prior to disinfection.  Then, use a household disinfectant. You can see a list of EPA-registered household disinfectants here. cdc.gov/coronavirus 12/29/2018 This information is not intended to replace advice given to you by your health care provider. Make sure you discuss any questions you have with your health care provider. Document Released: 12/08/2018 Document Revised: 01/06/2019 Document Reviewed: 12/08/2018 Elsevier Patient Education  2020 Elsevier Inc.  

## 2019-08-04 ENCOUNTER — Other Ambulatory Visit: Payer: Self-pay

## 2019-08-04 ENCOUNTER — Inpatient Hospital Stay (HOSPITAL_BASED_OUTPATIENT_CLINIC_OR_DEPARTMENT_OTHER): Payer: Self-pay | Admitting: Medical

## 2019-08-04 ENCOUNTER — Inpatient Hospital Stay: Payer: Self-pay

## 2019-08-04 ENCOUNTER — Encounter: Payer: Self-pay | Admitting: Internal Medicine

## 2019-08-04 ENCOUNTER — Inpatient Hospital Stay (HOSPITAL_BASED_OUTPATIENT_CLINIC_OR_DEPARTMENT_OTHER): Payer: Self-pay | Admitting: Internal Medicine

## 2019-08-04 VITALS — BP 128/58 | HR 117 | Resp 18

## 2019-08-04 VITALS — BP 124/79 | HR 88 | Temp 98.2°F | Resp 18 | Ht 62.0 in | Wt 191.8 lb

## 2019-08-04 DIAGNOSIS — C8111 Nodular sclerosis classical Hodgkin lymphoma, lymph nodes of head, face, and neck: Secondary | ICD-10-CM

## 2019-08-04 DIAGNOSIS — C8100 Nodular lymphocyte predominant Hodgkin lymphoma, unspecified site: Secondary | ICD-10-CM

## 2019-08-04 DIAGNOSIS — Z5111 Encounter for antineoplastic chemotherapy: Secondary | ICD-10-CM

## 2019-08-04 DIAGNOSIS — Z95828 Presence of other vascular implants and grafts: Secondary | ICD-10-CM | POA: Insufficient documentation

## 2019-08-04 LAB — CMP (CANCER CENTER ONLY)
ALT: 41 U/L (ref 0–44)
AST: 29 U/L (ref 15–41)
Albumin: 3.8 g/dL (ref 3.5–5.0)
Alkaline Phosphatase: 73 U/L (ref 38–126)
Anion gap: 8 (ref 5–15)
BUN: 14 mg/dL (ref 6–20)
CO2: 27 mmol/L (ref 22–32)
Calcium: 9 mg/dL (ref 8.9–10.3)
Chloride: 105 mmol/L (ref 98–111)
Creatinine: 0.71 mg/dL (ref 0.44–1.00)
GFR, Est AFR Am: >60 mL/min (ref >60)
GFR, Estimated: >60 mL/min (ref >60)
Glucose, Bld: 119 mg/dL — ABNORMAL HIGH (ref 70–99)
Potassium: 3.9 mmol/L (ref 3.5–5.1)
Sodium: 140 mmol/L (ref 135–145)
Total Bilirubin: 0.3 mg/dL (ref 0.3–1.2)
Total Protein: 6.9 g/dL (ref 6.5–8.1)

## 2019-08-04 LAB — CBC WITH DIFFERENTIAL (CANCER CENTER ONLY)
Abs Immature Granulocytes: 0.06 10*3/uL (ref 0.00–0.07)
Basophils Absolute: 0.1 10*3/uL (ref 0.0–0.1)
Basophils Relative: 1 %
Eosinophils Absolute: 0.3 10*3/uL (ref 0.0–0.5)
Eosinophils Relative: 5 %
HCT: 35.2 % — ABNORMAL LOW (ref 36.0–46.0)
Hemoglobin: 11.6 g/dL — ABNORMAL LOW (ref 12.0–15.0)
Immature Granulocytes: 1 %
Lymphocytes Relative: 35 %
Lymphs Abs: 2.4 10*3/uL (ref 0.7–4.0)
MCH: 28.6 pg (ref 26.0–34.0)
MCHC: 33 g/dL (ref 30.0–36.0)
MCV: 86.9 fL (ref 80.0–100.0)
Monocytes Absolute: 0.9 10*3/uL (ref 0.1–1.0)
Monocytes Relative: 14 %
Neutro Abs: 3 10*3/uL (ref 1.7–7.7)
Neutrophils Relative %: 44 %
Platelet Count: 270 10*3/uL (ref 150–400)
RBC: 4.05 MIL/uL (ref 3.87–5.11)
RDW: 14.6 % (ref 11.5–15.5)
WBC Count: 6.7 10*3/uL (ref 4.0–10.5)
nRBC: 0 % (ref 0.0–0.2)

## 2019-08-04 LAB — PREGNANCY, URINE: Preg Test, Ur: NEGATIVE

## 2019-08-04 MED ORDER — SODIUM CHLORIDE 0.9 % IV SOLN
10.0000 [IU]/m2 | Freq: Once | INTRAVENOUS | Status: AC
  Administered 2019-08-04: 19 [IU] via INTRAVENOUS
  Filled 2019-08-04: qty 6.33

## 2019-08-04 MED ORDER — LORAZEPAM 1 MG PO TABS
0.5000 mg | ORAL_TABLET | Freq: Once | ORAL | Status: AC
  Administered 2019-08-04: 0.5 mg via ORAL

## 2019-08-04 MED ORDER — DOXORUBICIN HCL CHEMO IV INJECTION 2 MG/ML
25.0000 mg/m2 | Freq: Once | INTRAVENOUS | Status: AC
  Administered 2019-08-04: 14:00:00 48 mg via INTRAVENOUS
  Filled 2019-08-04: qty 24

## 2019-08-04 MED ORDER — LORAZEPAM 1 MG PO TABS
ORAL_TABLET | ORAL | Status: AC
  Filled 2019-08-04: qty 1

## 2019-08-04 MED ORDER — SODIUM CHLORIDE 0.9% FLUSH
10.0000 mL | INTRAVENOUS | Status: DC | PRN
  Administered 2019-08-04: 17:00:00 10 mL
  Filled 2019-08-04: qty 10

## 2019-08-04 MED ORDER — SODIUM CHLORIDE 0.9% FLUSH
10.0000 mL | INTRAVENOUS | Status: DC | PRN
  Filled 2019-08-04: qty 10

## 2019-08-04 MED ORDER — VINBLASTINE SULFATE CHEMO INJECTION 1 MG/ML
5.7800 mg/m2 | Freq: Once | INTRAVENOUS | Status: AC
  Administered 2019-08-04: 14:00:00 11 mg via INTRAVENOUS
  Filled 2019-08-04: qty 11

## 2019-08-04 MED ORDER — SODIUM CHLORIDE 0.9 % IV SOLN
Freq: Once | INTRAVENOUS | Status: AC
  Administered 2019-08-04: 13:00:00 via INTRAVENOUS
  Filled 2019-08-04: qty 5

## 2019-08-04 MED ORDER — PALONOSETRON HCL INJECTION 0.25 MG/5ML
INTRAVENOUS | Status: AC
  Filled 2019-08-04: qty 5

## 2019-08-04 MED ORDER — HEPARIN SOD (PORK) LOCK FLUSH 100 UNIT/ML IV SOLN
500.0000 [IU] | Freq: Once | INTRAVENOUS | Status: AC | PRN
  Administered 2019-08-04: 500 [IU]
  Filled 2019-08-04: qty 5

## 2019-08-04 MED ORDER — PALONOSETRON HCL INJECTION 0.25 MG/5ML
0.2500 mg | Freq: Once | INTRAVENOUS | Status: AC
  Administered 2019-08-04: 13:00:00 0.25 mg via INTRAVENOUS

## 2019-08-04 MED ORDER — SODIUM CHLORIDE 0.9 % IV SOLN
Freq: Once | INTRAVENOUS | Status: AC
  Administered 2019-08-04: 12:00:00 via INTRAVENOUS
  Filled 2019-08-04: qty 250

## 2019-08-04 MED ORDER — SODIUM CHLORIDE 0.9 % IV SOLN
375.0000 mg/m2 | Freq: Once | INTRAVENOUS | Status: AC
  Administered 2019-08-04: 720 mg via INTRAVENOUS
  Filled 2019-08-04: qty 72

## 2019-08-04 NOTE — Patient Instructions (Signed)
[facility] Discharge Instructions for Patients Receiving Chemotherapy  Today you received the following chemotherapy agents: Doxorubicin, Vinblastine, Bleomycin and Dacarbazine.  To help prevent nausea and vomiting after your treatment, we encourage you to take your nausea medication as directed.    If you develop nausea and vomiting that is not controlled by your nausea medication, call the clinic.   BELOW ARE SYMPTOMS THAT SHOULD BE REPORTED IMMEDIATELY:  *FEVER GREATER THAN 100.5 F  *CHILLS WITH OR WITHOUT FEVER  NAUSEA AND VOMITING THAT IS NOT CONTROLLED WITH YOUR NAUSEA MEDICATION  *UNUSUAL SHORTNESS OF BREATH  *UNUSUAL BRUISING OR BLEEDING  TENDERNESS IN MOUTH AND THROAT WITH OR WITHOUT PRESENCE OF ULCERS  *URINARY PROBLEMS  *BOWEL PROBLEMS  UNUSUAL RASH Items with * indicate a potential emergency and should be followed up as soon as possible.  Feel free to call the clinic should you have any questions or concerns. The clinic phone number is [phone].  Please show the [facility] at check-in to the Emergency Department and triage nurse.

## 2019-08-04 NOTE — Progress Notes (Deleted)
At the end of DTIC infusion at 1555 she started complaining that she felt like her heart was racing. Normal saline started. Sandi Mealy, PA notified and to bedside. See vital signs. EKG obtained.  [address] [place] Progression Recent Vital Signs   BP (!) 128/58 (BP Location: Right Arm, Patient Position: Sitting)   Pulse (!) 117   Resp 18   SpO2 97%    Past Medical History:  Diagnosis Date  . Asthma      Expected Discharge Date     Diet Order    None       VTE Documentation      Work Intensity Score/Level of Care     @LEVELOFCARE @   Mobility        Significant Events    DC Barriers   Abnormal Labs:  Dorann Lodge 08/04/2019, 4:36 PM mg  [address] [place] Progression Recent Vital Signs   BP (!) 128/58 (BP Location: Right Arm, Patient Position: Sitting)   Pulse (!) 117   Resp 18   SpO2 97%    Past Medical History:  Diagnosis Date  . Asthma      Expected Discharge Date     Diet Order    None       VTE Documentation      Work Intensity Score/Level of Care     @LEVELOFCARE @   Mobility        Significant Events    DC Barriers   Abnormal Labs:  Dorann Lodge 08/04/2019, 4:35 PM MG

## 2019-08-04 NOTE — Progress Notes (Signed)
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  PRIOR THERAPY: None  CURRENT THERAPY: Systemic chemotherapy with ABVD on days 1 and 15 every 4 weeks.  First dose June 23, 2019.  She is status post 1 cycle and she is here today for day 15 of the second cycle.  INTERVAL HISTORY: Faith Pope 28 y.o. female returns to the clinic today for follow-up visit.  The patient is feeling fine today with no concerning complaints.  She denied having any current chest pain, shortness of breath, cough or hemoptysis.  She denied having any fever or chills.  She has no nausea, vomiting, diarrhea or constipation.  She has no headache or visual changes.  She continues to tolerate her treatment fairly well.  She is here today for evaluation before day 15 of the second cycle.  MEDICAL HISTORY: Past Medical History:  Diagnosis Date  . Asthma     ALLERGIES:  is allergic to other; shellfish allergy; amoxicillin; and penicillins.  MEDICATIONS:  Current Outpatient Medications  Medication Sig Dispense Refill  . albuterol (VENTOLIN HFA) 108 (90 Base) MCG/ACT inhaler Inhale 1-2 puffs into the lungs every 6 (six) hours as needed for wheezing or shortness of breath.    . allopurinol (ZYLOPRIM) 100 MG tablet Take 1 tablet (100 mg total) by mouth daily. 60 tablet 2  . aspirin-acetaminophen-caffeine (EXCEDRIN MIGRAINE) 250-250-65 MG tablet Take 2 tablets by mouth every 6 (six) hours as needed for headache.    . lidocaine-prilocaine (EMLA) cream Apply 1 application topically as needed. 30 g 0  . omeprazole (PRILOSEC) 40 MG capsule Take 1 capsule (40 mg total) by mouth daily. 30 capsule 5  . prochlorperazine (COMPAZINE) 10 MG tablet Take 1 tablet (10 mg total) by mouth every 6 (six) hours as needed for nausea or vomiting. 30 tablet 0   . sulfamethoxazole-trimethoprim (BACTRIM DS) 800-160 MG tablet Take 1 tablet by mouth 2 (two) times daily. 14 tablet 0   No current facility-administered medications for this visit.     SURGICAL HISTORY:  Past Surgical History:  Procedure Laterality Date  . IR BONE MARROW BIOPSY & ASPIRATION  06/21/2019  . IR IMAGING GUIDED PORT INSERTION  06/21/2019  . MASS BIOPSY Right 05/11/2019   Procedure: EXCISIONAL BIOPSY OF RIGHT CERVICAL LYMPH NODE;  Surgeon: Melida Quitter, MD;  Location: [place];  Service: ENT;  Laterality: Right;  . TONSILLECTOMY      REVIEW OF SYSTEMS:  A comprehensive review of systems was negative.   PHYSICAL EXAMINATION: General appearance: alert, cooperative and no distress Head: Normocephalic, without obvious abnormality, atraumatic Neck: no adenopathy, no JVD, supple, symmetrical, trachea midline and thyroid not enlarged, symmetric, no tenderness/mass/nodules Lymph nodes: Cervical, supraclavicular, and axillary nodes normal. Resp: clear to auscultation bilaterally Back: negative, no kyphosis present, symmetric, no curvature. ROM normal. No CVA tenderness. Cardio: regular rate and rhythm, S1, S2 normal, no murmur, click, rub or gallop GI: soft, non-tender; bowel sounds normal; no masses,  no organomegaly Extremities: extremities normal, atraumatic, no cyanosis or edema  ECOG PERFORMANCE STATUS: 1 - Symptomatic but completely ambulatory  Blood pressure 124/79, pulse 88, temperature 98.2 F (36.8 C), temperature source Oral, resp. rate 18, height 5' 2"  (1.575 m), weight 191 lb 12.8 oz (87 kg), SpO2 100 %.  LABORATORY DATA: Lab Results  Component Value Date  WBC 6.7 08/04/2019   HGB 11.6 (L) 08/04/2019   HCT 35.2 (L) 08/04/2019   MCV 86.9 08/04/2019   PLT 270 08/04/2019      Chemistry      Component Value Date/Time   NA 140 07/21/2019 1050   K 3.9 07/21/2019 1050   CL 107 07/21/2019 1050   CO2 24 07/21/2019 1050   BUN 11 07/21/2019 1050   CREATININE 0.64  07/21/2019 1050      Component Value Date/Time   CALCIUM 9.1 07/21/2019 1050   ALKPHOS 69 07/21/2019 1050   AST 22 07/21/2019 1050   ALT 29 07/21/2019 1050   BILITOT 0.3 07/21/2019 1050       RADIOGRAPHIC STUDIES: No results found.  ASSESSMENT AND PLAN: This is a very pleasant 28 years old African-American female recently diagnosed with a stage IIA Hodgkin lymphoma, nodular sclerosing subtype diagnosed in October 2020 and presented with right cervical and supraclavicular lymphadenopathy as well as right mediastinal lymphadenopathy.  The patient has no evidence of disease below the diaphragm. She does not have a bulky disease and she has no significant unfavorable risk factors. The patient is currently undergoing systemic chemotherapy with ABVD status post 1 cycle and she is currently undergoing cycle #2. I recommended for the patient to proceed with day 15 of cycle #2 today as planned. She will come back for follow-up visit after Christmas with repeat PET scan for restaging of her disease. She was advised to call immediately if she has any concerning symptoms in the interval. The patient voices understanding of current disease status and treatment options and is in agreement with the current care plan.  All questions were answered. The patient knows to call the clinic with any problems, questions or concerns. We can certainly see the patient much sooner if necessary.  I spent 10 minutes counseling the patient face to face. The total time spent in the appointment was 15 minutes.  Disclaimer: This note was dictated with voice recognition software. Similar sounding words can inadvertently be transcribed and may not be corrected upon review.

## 2019-08-04 NOTE — Progress Notes (Signed)
At the end of [facility] infusion at 1555 she started complaining that she felt like her heart was racing. Normal saline started. Sandi Mealy, PA notified and to bedside. See vital signs. EKG obtained. Ativan .5 mg po given per order. D/c'ed home per Sandi Mealy, PA. Instructed to call or go to ER for worsening symptoms. She verbalized understanding.

## 2019-08-05 ENCOUNTER — Telehealth: Payer: Self-pay | Admitting: Internal Medicine

## 2019-08-05 NOTE — Telephone Encounter (Signed)
Scheduled per los  °

## 2019-08-06 NOTE — Progress Notes (Signed)
Faith Pope was seen in infusion after completing ABVD today. She reported palpitations. Her pulse was noted to be 125 BPM. An EKG was completed as returned showing sinus tachycardia at 112 BPM. The EKG was otherwise normal. She was given Ativan 0.5 mg x 1 and was discharged home after her pulse decreased to 107. She had no other issues of concern.  Sandi Mealy, MHS, PA-C Physician Assistant

## 2019-08-11 ENCOUNTER — Encounter (HOSPITAL_COMMUNITY): Payer: Medicaid Other

## 2019-08-17 ENCOUNTER — Encounter (HOSPITAL_COMMUNITY)
Admission: RE | Admit: 2019-08-17 | Discharge: 2019-08-17 | Disposition: A | Discharge status: Home / Self Care | Payer: Self-pay | Source: Ambulatory Visit | Attending: Physician Assistant | Admitting: Physician Assistant

## 2019-08-17 ENCOUNTER — Other Ambulatory Visit: Payer: Self-pay

## 2019-08-17 DIAGNOSIS — C8100 Nodular lymphocyte predominant Hodgkin lymphoma, unspecified site: Secondary | ICD-10-CM | POA: Insufficient documentation

## 2019-08-17 LAB — GLUCOSE, CAPILLARY: Glucose-Capillary: 96 mg/dL (ref 70–99)

## 2019-08-17 MED ORDER — FLUDEOXYGLUCOSE F - 18 (FDG) INJECTION
9.5300 | Freq: Once | INTRAVENOUS | Status: AC | PRN
  Administered 2019-08-17: 10:00:00 9.53 via INTRAVENOUS

## 2019-08-18 ENCOUNTER — Ambulatory Visit: Payer: Medicaid Other | Admitting: Internal Medicine

## 2019-08-18 ENCOUNTER — Other Ambulatory Visit: Payer: Medicaid Other

## 2019-08-18 ENCOUNTER — Ambulatory Visit: Payer: Medicaid Other

## 2019-08-23 ENCOUNTER — Other Ambulatory Visit: Payer: Self-pay

## 2019-08-23 ENCOUNTER — Inpatient Hospital Stay: Payer: Self-pay

## 2019-08-23 ENCOUNTER — Inpatient Hospital Stay (HOSPITAL_BASED_OUTPATIENT_CLINIC_OR_DEPARTMENT_OTHER): Payer: Self-pay | Admitting: Internal Medicine

## 2019-08-23 ENCOUNTER — Encounter: Payer: Self-pay | Admitting: Internal Medicine

## 2019-08-23 VITALS — BP 130/96 | HR 83 | Temp 98.5°F | Resp 18 | Ht 62.0 in | Wt 194.4 lb

## 2019-08-23 DIAGNOSIS — Z7189 Other specified counseling: Secondary | ICD-10-CM

## 2019-08-23 DIAGNOSIS — Z95828 Presence of other vascular implants and grafts: Secondary | ICD-10-CM

## 2019-08-23 DIAGNOSIS — C8111 Nodular sclerosis classical Hodgkin lymphoma, lymph nodes of head, face, and neck: Secondary | ICD-10-CM

## 2019-08-23 DIAGNOSIS — C8102 Nodular lymphocyte predominant Hodgkin lymphoma, intrathoracic lymph nodes: Secondary | ICD-10-CM

## 2019-08-23 LAB — CBC WITH DIFFERENTIAL (CANCER CENTER ONLY)
Abs Immature Granulocytes: 0.08 10*3/uL — ABNORMAL HIGH (ref 0.00–0.07)
Basophils Absolute: 0 10*3/uL (ref 0.0–0.1)
Basophils Relative: 1 %
Eosinophils Absolute: 0.3 10*3/uL (ref 0.0–0.5)
Eosinophils Relative: 4 %
HCT: 33.6 % — ABNORMAL LOW (ref 36.0–46.0)
Hemoglobin: 11.1 g/dL — ABNORMAL LOW (ref 12.0–15.0)
Immature Granulocytes: 1 %
Lymphocytes Relative: 36 %
Lymphs Abs: 2.7 10*3/uL (ref 0.7–4.0)
MCH: 28.7 pg (ref 26.0–34.0)
MCHC: 33 g/dL (ref 30.0–36.0)
MCV: 86.8 fL (ref 80.0–100.0)
Monocytes Absolute: 0.9 10*3/uL (ref 0.1–1.0)
Monocytes Relative: 13 %
Neutro Abs: 3.5 10*3/uL (ref 1.7–7.7)
Neutrophils Relative %: 45 %
Platelet Count: 328 10*3/uL (ref 150–400)
RBC: 3.87 MIL/uL (ref 3.87–5.11)
RDW: 15.1 % (ref 11.5–15.5)
WBC Count: 7.5 10*3/uL (ref 4.0–10.5)
nRBC: 0 % (ref 0.0–0.2)

## 2019-08-23 LAB — CMP (CANCER CENTER ONLY)
ALT: 45 U/L — ABNORMAL HIGH (ref 0–44)
AST: 24 U/L (ref 15–41)
Albumin: 3.7 g/dL (ref 3.5–5.0)
Alkaline Phosphatase: 67 U/L (ref 38–126)
Anion gap: 7 (ref 5–15)
BUN: 12 mg/dL (ref 6–20)
CO2: 24 mmol/L (ref 22–32)
Calcium: 8.4 mg/dL — ABNORMAL LOW (ref 8.9–10.3)
Chloride: 108 mmol/L (ref 98–111)
Creatinine: 0.62 mg/dL (ref 0.44–1.00)
GFR, Est AFR Am: >60 mL/min (ref >60)
GFR, Estimated: >60 mL/min (ref >60)
Glucose, Bld: 92 mg/dL (ref 70–99)
Potassium: 3.6 mmol/L (ref 3.5–5.1)
Sodium: 139 mmol/L (ref 135–145)
Total Bilirubin: 0.2 mg/dL — ABNORMAL LOW (ref 0.3–1.2)
Total Protein: 6.6 g/dL (ref 6.5–8.1)

## 2019-08-23 LAB — PREGNANCY, URINE: Preg Test, Ur: NEGATIVE

## 2019-08-23 MED ORDER — SODIUM CHLORIDE 0.9% FLUSH
10.0000 mL | INTRAVENOUS | Status: DC | PRN
  Administered 2019-08-23: 10 mL
  Filled 2019-08-23: qty 10

## 2019-08-23 NOTE — Progress Notes (Signed)
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  PRIOR THERAPY:  Systemic chemotherapy with ABVD on days 1 and 15 every 4 weeks.  First dose June 23, 2019.  She is status post 2 cycles.   CURRENT THERAPY: None.  INTERVAL HISTORY: Faith Pope 28 y.o. female returns to the clinic today for follow-up visit.  The patient is a little bit anxious and tearful about her current condition and the scan results.  She tolerated the last 2 cycles of her systemic chemotherapy with ABVD fairly well.  She denied having any current chest pain, shortness of breath, cough or hemoptysis.  She denied having any fever or chills.  She has no nausea, vomiting, diarrhea or constipation.  She has no headache or visual changes.  She had repeat PET scan performed recently and she is here for evaluation and discussion of her PET scan results and treatment options.  MEDICAL HISTORY: Past Medical History:  Diagnosis Date  . Asthma     ALLERGIES:  is allergic to other; shellfish allergy; amoxicillin; and penicillins.  MEDICATIONS:  Current Outpatient Medications  Medication Sig Dispense Refill  . albuterol (VENTOLIN HFA) 108 (90 Base) MCG/ACT inhaler Inhale 1-2 puffs into the lungs every 6 (six) hours as needed for wheezing or shortness of breath.    . allopurinol (ZYLOPRIM) 100 MG tablet Take 1 tablet (100 mg total) by mouth daily. 60 tablet 2  . aspirin-acetaminophen-caffeine (EXCEDRIN MIGRAINE) 250-250-65 MG tablet Take 2 tablets by mouth every 6 (six) hours as needed for headache.    . lidocaine-prilocaine (EMLA) cream Apply 1 application topically as needed. 30 g 0  . omeprazole (PRILOSEC) 40 MG capsule Take 1 capsule (40 mg total) by mouth daily. 30 capsule 5  . prochlorperazine (COMPAZINE) 10 MG tablet Take 1 tablet (10 mg  total) by mouth every 6 (six) hours as needed for nausea or vomiting. 30 tablet 0  . sulfamethoxazole-trimethoprim (BACTRIM DS) 800-160 MG tablet Take 1 tablet by mouth 2 (two) times daily. 14 tablet 0   No current facility-administered medications for this visit.    SURGICAL HISTORY:  Past Surgical History:  Procedure Laterality Date  . IR BONE MARROW BIOPSY & ASPIRATION  06/21/2019  . IR IMAGING GUIDED PORT INSERTION  06/21/2019  . MASS BIOPSY Right 05/11/2019   Procedure: EXCISIONAL BIOPSY OF RIGHT CERVICAL LYMPH NODE;  Surgeon: Melida Quitter, MD;  Location: [place];  Service: ENT;  Laterality: Right;  . TONSILLECTOMY      REVIEW OF SYSTEMS:  Constitutional: negative Eyes: negative Ears, nose, mouth, throat, and face: negative Respiratory: negative Cardiovascular: negative Gastrointestinal: negative Genitourinary:negative Integument/breast: negative Hematologic/lymphatic: negative Musculoskeletal:negative Neurological: negative Behavioral/Psych: negative Endocrine: negative Allergic/Immunologic: negative   PHYSICAL EXAMINATION: General appearance: alert, cooperative and no distress Head: Normocephalic, without obvious abnormality, atraumatic Neck: no adenopathy, no JVD, supple, symmetrical, trachea midline and thyroid not enlarged, symmetric, no tenderness/mass/nodules Lymph nodes: Cervical, supraclavicular, and axillary nodes normal. Resp: clear to auscultation bilaterally Back: negative, no kyphosis present, symmetric, no curvature. ROM normal. No CVA tenderness. Cardio: regular rate and rhythm, S1, S2 normal, no murmur, click, rub or gallop GI: soft, non-tender; bowel sounds normal; no masses,  no organomegaly Extremities: extremities normal, atraumatic, no cyanosis or edema Neurologic: Alert and oriented X 3, normal strength and tone. Normal symmetric reflexes. Normal coordination  and gait  ECOG PERFORMANCE STATUS: 1 - Symptomatic but completely ambulatory  Blood  pressure (!) 130/96, pulse 83, temperature 98.5 F (36.9 C), temperature source Temporal, resp. rate 18, height _0  (1.575 m), weight 194 lb 6.4 oz (88.2 kg), SpO2 100 %.  LABORATORY DATA: Lab Results  Component Value Date   WBC 7.5 08/23/2019   HGB 11.1 (L) 08/23/2019   HCT 33.6 (L) 08/23/2019   MCV 86.8 08/23/2019   PLT 328 08/23/2019      Chemistry      Component Value Date/Time   NA 140 08/04/2019 1102   K 3.9 08/04/2019 1102   CL 105 08/04/2019 1102   CO2 27 08/04/2019 1102   BUN 14 08/04/2019 1102   CREATININE 0.71 08/04/2019 1102      Component Value Date/Time   CALCIUM 9.0 08/04/2019 1102   ALKPHOS 73 08/04/2019 1102   AST 29 08/04/2019 1102   ALT 41 08/04/2019 1102   BILITOT 0.3 08/04/2019 1102       RADIOGRAPHIC STUDIES: NM PET Image Restag (PS) Skull Base To Thigh  Result Date: 08/17/2019 CLINICAL DATA:  Subsequent treatment strategy for Hodgkin's lymphoma. EXAM: NUCLEAR MEDICINE PET SKULL BASE TO THIGH TECHNIQUE: 9.5 mCi F-18 FDG was injected intravenously. Full-ring PET imaging was performed from the skull base to thigh after the radiotracer. CT data was obtained and used for attenuation correction and anatomic localization. Fasting blood glucose: 93 mg/dl COMPARISON:  PET-CT 06/11/2019 FINDINGS: Mediastinal blood pool activity: SUV max 2.1 Liver activity: SUV max 3.2 NECK: Resolution previously hypermetabolic LEFT level 3 lymph nodes. No residual nodes remain Incidental CT findings: Port in the anterior chest wall with tip in distal SVC. CHEST: Interval reduction size and metabolic activity anterior mediastinal lymph nodes. Residual nodal thickening anterior to the high SVC measures 10 mm in thickness compared to 26 mm on prior. Residual tissue has low metabolic activity (SUV max 2.2) similar to background blood pool activity. Similar reduction in size and metabolic active subcarinal lymphadenopathy. Residual subcarinal nodal tissue measures 1.5 cm decreased from  2.5 cm. Mild metabolic activity with SUV max equal 3.0 decreased SUV max equal 15.5. Incidental CT findings: Nodularity in the medial RIGHT lower lobe and mild bronchiectasis not changed (image 78/4). No associated metabolic activity ABDOMEN/PELVIS: No abnormal hypermetabolic activity within the liver, pancreas, adrenal glands, or spleen. No hypermetabolic lymph nodes in the abdomen or pelvis. Normal spleen. Incidental CT findings: Normal uterus and ovaries SKELETON: No focal hypermetabolic activity to suggest skeletal metastasis. Physiologic activity associated the ovaries. Incidental CT findings: none IMPRESSION: 1. Reduction in size and metabolic activity of LEFT level III lymph nodes and bulky mediastinal lymph nodes. Residual activity is similar to background blood pool and less than liver ( [place] 2). 2. Focal bronchiectasis in the RIGHT lower lobe unchanged from prior. 3. No splenomegaly or bone marrow involvement. Electronically Signed   By: Suzy Bouchard M.D.   On: 08/17/2019 14:58    ASSESSMENT AND PLAN: This is a very pleasant 28 years old African-American female recently diagnosed with a stage IIA Hodgkin lymphoma, nodular sclerosing subtype diagnosed in October 2020 and presented with right cervical and supraclavicular lymphadenopathy as well as right mediastinal lymphadenopathy.  The patient has no evidence of disease below the diaphragm. She does not have a bulky disease and she has no significant unfavorable risk factors. The patient is currently undergoing systemic chemotherapy with ABVD status post 2 cycles. The patient tolerated the last 2 cycles of her treatment  fairly well except for fatigue.  She had repeat PET scan performed recently.  I personally and independently reviewed the scan images and discussed the result and showed the images to the patient today.  Her scan showed complete response with no residual metabolic activity above the blood pool. I recommended for the patient  to discontinue her systemic chemotherapy at this point.  I will refer her to radiation oncology for evaluation and consideration of consolidative treatment with radiotherapy. I will arrange for the patient to come back for follow-up visit in 4 months for evaluation with repeat CT scan of the chest for restaging of her disease. She will have Port-A-Cath flush every 2 months. The patient was advised to call immediately if she has any concerning symptoms in the interval.  The patient voices understanding of current disease status and treatment options and is in agreement with the current care plan.  All questions were answered. The patient knows to call the clinic with any problems, questions or concerns. We can certainly see the patient much sooner if necessary.   Disclaimer: This note was dictated with voice recognition software. Similar sounding words can inadvertently be transcribed and may not be corrected upon review.

## 2019-08-24 ENCOUNTER — Encounter: Payer: Self-pay | Admitting: Radiation Oncology

## 2019-08-24 ENCOUNTER — Telehealth: Payer: Self-pay | Admitting: Internal Medicine

## 2019-08-24 NOTE — Telephone Encounter (Signed)
Cancelled all chemo and f/u appts that were scheduled. Add new appts per los. Called and spoke with patient. Confirmed appt

## 2019-08-24 NOTE — Progress Notes (Signed)
Lymphoma Location(s) / Histology:  05/11/19 DIAGNOSIS: A. Lymph node, right cervical, biopsy: - Classical Hodgkin lymphoma, nodule sclerosis subtype  06/21/19 DIAGNOSIS: BONE MARROW, ASPIRATE, CLOT, CORE: - Mildly hypercellular marrow with mild myeloid hyperplasia. - No evidence of lymphoma  Faith Pope presented with symptoms of: Right neck lymphadenopathy to Dr. Janace Hoard on 03/01/19  Biopsies of right cervical lymph node revealed: Classical hodgkin lymphoma.   Past/Anticipated interventions by medical oncology, if any:  08/23/19 Dr. Julien Nordmann ASSESSMENT AND PLAN: This is a very pleasant 28 years old African-American female recently diagnosed with a stage IIA Hodgkin lymphoma, nodular sclerosing subtype diagnosed in October 2020 and presented with right cervical and supraclavicular lymphadenopathy as well as right mediastinal lymphadenopathy.  The patient has no evidence of disease below the diaphragm. She does not have a bulky disease and she has no significant unfavorable risk factors. The patient is currently undergoing systemic chemotherapy with ABVD status post 2 cycles. The patient tolerated the last 2 cycles of her treatment fairly well except for fatigue.  She had repeat PET scan performed recently.  I personally and independently reviewed the scan images and discussed the result and showed the images to the patient today.  Her scan showed complete response with no residual metabolic activity above the blood pool. I recommended for the patient to discontinue her systemic chemotherapy at this point.  I will refer her to radiation oncology for evaluation and consideration of consolidative treatment with radiotherapy. I will arrange for the patient to come back for follow-up visit in 4 months for evaluation with repeat CT scan of the chest for restaging of her disease. She will have Port-A-Cath flush every 2 months. The patient was advised to call immediately if she has any concerning symptoms  in the interval.  Weight changes, if any, over the past 6 months: She has gained weight over the past 6 months.   Recurrent fevers, or drenching night sweats, if any: She denies.   SAFETY ISSUES:  Prior radiation? No  Pacemaker/ICD? No  Possible current pregnancy? She denies recent intercourse. She is not using birth control.   Is the patient on methotrexate? No  Current Complaints / other details:

## 2019-08-30 ENCOUNTER — Encounter: Payer: Self-pay | Admitting: Radiation Oncology

## 2019-08-30 ENCOUNTER — Other Ambulatory Visit: Payer: Self-pay

## 2019-08-30 ENCOUNTER — Ambulatory Visit
Admission: RE | Admit: 2019-08-30 | Discharge: 2019-08-30 | Disposition: A | Discharge status: Home / Self Care | Payer: Medicaid Other | Source: Ambulatory Visit | Attending: Radiation Oncology | Admitting: Radiation Oncology

## 2019-08-30 DIAGNOSIS — C8100 Nodular lymphocyte predominant Hodgkin lymphoma, unspecified site: Secondary | ICD-10-CM

## 2019-08-30 DIAGNOSIS — Z510 Encounter for antineoplastic radiation therapy: Secondary | ICD-10-CM | POA: Insufficient documentation

## 2019-08-30 DIAGNOSIS — C8118 Nodular sclerosis classical Hodgkin lymphoma, lymph nodes of multiple sites: Secondary | ICD-10-CM

## 2019-08-30 HISTORY — DX: Malignant (primary) neoplasm, unspecified: C80.1

## 2019-08-30 NOTE — Progress Notes (Addendum)
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 female who presented with  Faith Pope presented with symptoms of: Right neck lymphadenopathy.  CT scan of neck on 01/22/2019 showed enlarged adenopathy in the right supraclavicular fossa and visualized upper mediastinum; she presented to Dr. Janace Hoard on 03/01/19  05-11-19 Biopsy of right cervical lymph node revealed: Classical Hodgkin lymphoma, nodule sclerosis subtype.    Bone marrow biopsy on June 21, 2019 was negative  Initial PET scan performed on June 11, 2019, personally reviewed by me, revealed evidence of lymphoma, nonbulky, in the right jugular, supraclavicular, subpectoral, subcarinal, and cardiophrenic angle.  There were nodules along the right minor fissure consistent with subpleural lymph nodes as well.  No evidence of lymphoma in the abdomen or pelvis.  Patient's lymphoma was staged IIA by medical oncology.  She did not have any B symptoms at diagnosis.  She was felt to be favorable risk.  Her ESR was 38 at diagnosis.  It should be noted that she has at least 3 sites of nodal disease.  She proceeded with  systemic chemotherapy with ABVD and received 2 cycles. The patient  tolerated the last 2 cycles of her treatment fairly well except for fatigue.    She had repeat PET scan performed recently.    I personally reviewed those images.  This shows complete PET response.    Medical oncology has stopped systemic therapy.  She has been referred for consideration of consolidative radiotherapy.      SAFETY ISSUES:  Prior radiation? No  Pacemaker/ICD? No  Possible current pregnancy? She denies recent intercourse. She is not using birth control.  Is the patient on methotrexate? No  She is not working. She lives with one other person.  She denies significant residual side effects from chemotherapy  Current smoker of cigars. She has asthma.  Not sexually active.    She reports fatigue. She has wheezing and SOB. No HAs, no bowel issues, no urinary issues, no breast masses, no reflux. + taste changes.  She has gained 25 lbs since starting chemotherapy   She denies any fevers or night sweats, no weight changes before diagnosis.  She is not claustrophobic.   She is not currently employed.  She does have transportation.  PREVIOUS RADIATION THERAPY: no  PAST MEDICAL HISTORY:  has a past medical history of Asthma and Cancer ([place]).    PAST SURGICAL HISTORY: Past Surgical History:  Procedure Laterality Date  . IR BONE MARROW BIOPSY & ASPIRATION  06/21/2019  . IR IMAGING GUIDED PORT INSERTION  06/21/2019  . MASS BIOPSY Right 05/11/2019   Procedure: EXCISIONAL BIOPSY OF RIGHT CERVICAL LYMPH NODE;  Surgeon: Melida Quitter, MD;  Location: [facility];  Service: ENT;  Laterality: Right;  . TONSILLECTOMY  FAMILY HISTORY: family history is not on file. No breast cancer in her family. No  Known cancers in family.   SOCIAL HISTORY:  reports that she has been smoking cigars. She has been smoking about 1.00 pack per day. She has never used smokeless tobacco. She reports current alcohol use. She reports that she does not use drugs.  ALLERGIES: Other, Shellfish allergy,  Amoxicillin, and Penicillins  MEDICATIONS:  Current Outpatient Medications  Medication Sig Dispense Refill  . albuterol (VENTOLIN HFA) 108 (90 Base) MCG/ACT inhaler Inhale 1-2 puffs into the lungs every 6 (six) hours as needed for wheezing or shortness of breath.    Marland Kitchen aspirin-acetaminophen-caffeine (EXCEDRIN MIGRAINE) 250-250-65 MG tablet Take 2 tablets by mouth every 6 (six) hours as needed for headache.    . lidocaine-prilocaine (EMLA) cream Apply 1 application topically as needed. 30 g 0  . allopurinol (ZYLOPRIM) 100 MG tablet Take 1 tablet (100 mg total) by mouth daily. (Patient not taking: Reported on 08/30/2019) 60 tablet 2  . omeprazole (PRILOSEC) 40 MG capsule Take 1 capsule (40 mg total) by mouth daily. (Patient not taking: Reported on 08/30/2019) 30 capsule 5  . prochlorperazine (COMPAZINE) 10 MG tablet Take 1 tablet (10 mg total) by mouth every 6 (six) hours as needed for nausea or vomiting. (Patient not taking: Reported on 08/30/2019) 30 tablet 0   No current facility-administered medications for this encounter.    REVIEW OF SYSTEMS:  Notable for that above.   PHYSICAL EXAM:  vitals were not taken for this visit.    LABORATORY DATA:  Lab Results  Component Value Date   WBC 7.5 08/23/2019   HGB 11.1 (L) 08/23/2019   HCT 33.6 (L) 08/23/2019   MCV 86.8 08/23/2019   PLT 328 08/23/2019   CMP     Component Value Date/Time   NA 139 08/23/2019 1151   K 3.6 08/23/2019 1151   CL 108 08/23/2019 1151   CO2 24 08/23/2019 1151   GLUCOSE 92 08/23/2019 1151   BUN 12 08/23/2019 1151   CREATININE 0.62 08/23/2019 1151   CALCIUM 8.4 (L) 08/23/2019 1151   PROT 6.6 08/23/2019 1151   ALBUMIN 3.7 08/23/2019 1151   AST 24 08/23/2019 1151   ALT 45 (H) 08/23/2019 1151   ALKPHOS 67 08/23/2019 1151   BILITOT 0.2 (L) 08/23/2019 1151   GFRNONAA >60 08/23/2019 1151   GFRAA >60 08/23/2019 1151         RADIOGRAPHY: NM PET Image Restag (PS) Skull Base To Thigh  Result Date:  08/17/2019 CLINICAL DATA:  Subsequent treatment strategy for Hodgkin's lymphoma. EXAM: NUCLEAR MEDICINE PET SKULL BASE TO THIGH TECHNIQUE: 9.5 mCi F-18 FDG was injected intravenously. Full-ring PET imaging was performed from the skull base to thigh after the radiotracer. CT data was obtained and used for attenuation correction and anatomic localization. Fasting blood glucose: 93 mg/dl COMPARISON:  PET-CT 06/11/2019 FINDINGS: Mediastinal blood pool activity: SUV max 2.1 Liver activity: SUV max 3.2 NECK: Resolution previously hypermetabolic LEFT level 3 lymph nodes. No residual nodes remain Incidental CT findings: Port in the anterior chest wall with tip in distal SVC. CHEST: Interval reduction size and metabolic activity anterior mediastinal lymph nodes. Residual nodal thickening anterior to the high SVC measures 10 mm in thickness compared to 26 mm on prior. Residual tissue has low metabolic activity (SUV max 2.2) similar to background blood pool activity. Similar reduction in size and metabolic active subcarinal lymphadenopathy. Residual subcarinal nodal tissue measures 1.5 cm decreased from 2.5 cm. Mild  metabolic activity with SUV max equal 3.0 decreased SUV max equal 15.5. Incidental CT findings: Nodularity in the medial RIGHT lower lobe and mild bronchiectasis not changed (image 78/4). No associated metabolic activity ABDOMEN/PELVIS: No abnormal hypermetabolic activity within the liver, pancreas, adrenal glands, or spleen. No hypermetabolic lymph nodes in the abdomen or pelvis. Normal spleen. Incidental CT findings: Normal uterus and ovaries SKELETON: No focal hypermetabolic activity to suggest skeletal metastasis. Physiologic activity associated the ovaries. Incidental CT findings: none IMPRESSION: 1. Reduction in size and metabolic activity of LEFT level III lymph nodes and bulky mediastinal lymph nodes. Residual activity is similar to background blood pool and less than liver ( [place] 2). 2. Focal  bronchiectasis in the RIGHT lower lobe unchanged from prior. 3. No splenomegaly or bone marrow involvement. Electronically Signed   By: Suzy Bouchard M.D.   On: 08/17/2019 14:58      IMPRESSION/PLAN:   Today, I talked to the patient about the findings and work-up thus far. We discussed the patient's diagnosis of Hodgkin's lymphoma and general treatment for this, highlighting the role of radiotherapy in the management. We discussed the available radiation techniques, and focused on the details of logistics and delivery.    We discussed the fact that her disease is borderline favorable risk.  There is a range of radiation dose that could be considered for her disease.  Because she has at least 3 sites of nodal disease, she would not have technically qualify for the [place] Hodgkin's study group trial that minimize favorable risk patients to 20 Gray following chemotherapy.  However, I spoke about this patient with a Editor, commissioning that I know and our consensus is that it is very reasonable to give her reduced dose radiation to minimize her risk of late effects (and given all of her other favorable risk factors and complete response on PET).  The patient and I spoke about the pros and cons of giving 30 Gy versus 20 Pearline Cables and she is enthusiastic about reduced dose radiation.  She and I also spoke about the opportunity for her to obtain a second opinion [facility] a [facility] but she is not interested in that and she very much wants to receive her treatment close to home.  I anticipate treating her with a 3-dimensional technique (to reduce dose spillage into normal tissues) and to ensure that at least 20 Gy given to the majority of the planning target volume, she will likely receive 22 Gray in 11 fractions.  We also spoke in detail about the importance of not getting pregnant during treatment.  She recently had a negative pregnancy test and I have ordered another one to take place before radiation  treatment.  She understands the risks of radiation exposure to a fetus.  We discussed the risks, benefits, and side effects of radiotherapy. Side effects may include but not necessarily be limited to: Fatigue, esophagitis, pneumonitis, skin irritation, taste changes, mucositis, late effects including hypothyroidism, lung damage, heart damage, internal organ damage, and/or secondary cancers.  I anticipate taping her breasts laterally so that they are displaced from the radiation fields and we talked about this technique to minimize her risk of late effects.  She understands that she should start breast screening imaging in 7 to 10 years from now and depending on how technology evolves, this may be in the form of MRIs or potentially ultrasounds. No guarantees of treatment were given. A consent form was signed and placed in the patient's medical record.  I asked  the patient today about tobacco use. The patient uses tobacco.  I advised the patient to quit.  I explained that this can lead to complications from therapy and can also heighten her risk of lung cancer, other cancers, including secondary cancers from radiotherapy.  Services were offered by me today including outpatient counseling and pharmacotherapy. I assessed for the willingness to attempt to quit and provided encouragement and demonstrated willingness to make referrals and/or prescriptions to help the patient attempt to quit. The patient has follow-up with the oncologic team to touch base on their tobacco use and /or cessation efforts.  Over 5 minutes were spent on this issue.  She plans to taper down on her own with a Jan 23 quit date.  She understands that all forms of smoking are harmful, including vaping.  The patient was encouraged to ask questions that I answered to the best of my ability. We will schedule her for CT simulation in the near future.  This encounter was provided by telemedicine platform by telephone as patient was unable to  access MyChart video during pandemic precautions The patient has given verbal consent for this type of encounter and has been advised to only accept a meeting of this type in a secure network environment. The total time spent during this encounter, on the date of the encounter, was 50 minutes. The attendants for this meeting include Eppie Gibson  and Faith Pope.  During the encounter, Eppie Gibson was located at [facility].  Faith Pope was located at home.    __________________________________________   Eppie Gibson, MD

## 2019-08-31 ENCOUNTER — Other Ambulatory Visit: Payer: Self-pay | Admitting: Radiation Oncology

## 2019-08-31 ENCOUNTER — Encounter: Payer: Self-pay | Admitting: Radiation Oncology

## 2019-08-31 DIAGNOSIS — C8118 Nodular sclerosis classical Hodgkin lymphoma, lymph nodes of multiple sites: Secondary | ICD-10-CM

## 2019-09-01 ENCOUNTER — Ambulatory Visit
Admission: RE | Admit: 2019-09-01 | Discharge: 2019-09-01 | Disposition: A | Discharge status: Home / Self Care | Payer: Medicaid Other | Source: Ambulatory Visit | Attending: Radiation Oncology | Admitting: Radiation Oncology

## 2019-09-01 ENCOUNTER — Other Ambulatory Visit: Payer: Self-pay

## 2019-09-01 DIAGNOSIS — C8118 Nodular sclerosis classical Hodgkin lymphoma, lymph nodes of multiple sites: Secondary | ICD-10-CM

## 2019-09-01 LAB — PREGNANCY, URINE: Preg Test, Ur: NEGATIVE

## 2019-09-01 NOTE — Progress Notes (Signed)
  Radiation Oncology         [phone] ________________________________  Name: Faith Pope MRN: [id number]  Date: 09/01/2019  DOB: May 31, 1991  SIMULATION AND TREATMENT PLANNING NOTE  Outpatient  DIAGNOSIS:     ICD-10-CM   1. 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  Identity was confirmed.  All relevant records and images related to the planned course of therapy were reviewed.  The patient freely provided informed written consent to proceed with treatment after reviewing the details related to the planned course of therapy. The consent form was witnessed and verified by the simulation staff.    Then, the patient was set-up in a stable reproducible  supine position for radiation therapy.   Her neck was hyperextended and her breasts were taped laterally to displace them as much as possible from the radiation fields.  Aquaplast mask for head neck and upper chest was fabricated.  CT images were obtained.  Surface markings were placed.  The CT images were loaded into the planning software.    TREATMENT PLANNING NOTE: Treatment planning then occurred.  The radiation prescription was entered and confirmed.    A total of 3 medically necessary complex treatment devices were fabricated and supervised by me, in the form of Aquaplast mask for head and upper chest/neck, and two parent fields (AP/PA). MORE FIELDS WITH MLCs MAY BE ADDED IN DOSIMETRY for dose homogeneity.  I have requested : 3D Simulation  I have requested a DVH of the following structures: Spinal cord, heart, esophagus, lungs, targets.    The patient will receive 22 Gy in 11 fractions to involved sites.  I reiterated to the patient the importance of stopping smoking as soon as possible and the importance of avoiding pregnancy before and during radiotherapy.  She demonstrates good understanding of these recommendations.   -----------------------------------  Eppie Gibson, MD

## 2019-09-06 ENCOUNTER — Ambulatory Visit: Payer: Medicaid Other

## 2019-09-06 ENCOUNTER — Other Ambulatory Visit: Payer: Medicaid Other

## 2019-09-06 ENCOUNTER — Ambulatory Visit: Payer: Medicaid Other | Admitting: Physician Assistant

## 2019-09-08 ENCOUNTER — Other Ambulatory Visit: Payer: Self-pay | Admitting: Radiation Oncology

## 2019-09-08 ENCOUNTER — Ambulatory Visit
Admission: RE | Admit: 2019-09-08 | Discharge: 2019-09-08 | Disposition: A | Discharge status: Home / Self Care | Payer: Medicaid Other | Source: Ambulatory Visit | Attending: Radiation Oncology | Admitting: Radiation Oncology

## 2019-09-08 ENCOUNTER — Other Ambulatory Visit: Payer: Self-pay

## 2019-09-08 DIAGNOSIS — C8191 Hodgkin lymphoma, unspecified, lymph nodes of head, face, and neck: Secondary | ICD-10-CM

## 2019-09-08 MED ORDER — LORAZEPAM 0.5 MG PO TABS
0.5000 mg | ORAL_TABLET | Freq: Once | ORAL | Status: AC
  Administered 2019-09-08: 0.5 mg via ORAL
  Filled 2019-09-08: qty 1

## 2019-09-08 MED ORDER — LORAZEPAM 0.5 MG PO TABS: ORAL_TABLET | ORAL | 0 refills | Status: DC

## 2019-09-08 MED FILL — LORazepam 0.5 MG TABS: 0.5 | 10 days supply | Qty: 10 | Fill #0

## 2019-09-09 ENCOUNTER — Other Ambulatory Visit: Payer: Self-pay

## 2019-09-09 ENCOUNTER — Ambulatory Visit
Admission: RE | Admit: 2019-09-09 | Discharge: 2019-09-09 | Disposition: A | Discharge status: Home / Self Care | Payer: Medicaid Other | Source: Ambulatory Visit | Attending: Radiation Oncology | Admitting: Radiation Oncology

## 2019-09-10 ENCOUNTER — Other Ambulatory Visit: Payer: Self-pay

## 2019-09-10 ENCOUNTER — Ambulatory Visit
Admission: RE | Admit: 2019-09-10 | Discharge: 2019-09-10 | Disposition: A | Discharge status: Home / Self Care | Payer: Medicaid Other | Source: Ambulatory Visit | Attending: Radiation Oncology | Admitting: Radiation Oncology

## 2019-09-13 ENCOUNTER — Other Ambulatory Visit: Payer: Self-pay | Admitting: Radiation Oncology

## 2019-09-13 ENCOUNTER — Ambulatory Visit
Admission: RE | Admit: 2019-09-13 | Discharge: 2019-09-13 | Disposition: A | Discharge status: Home / Self Care | Payer: Medicaid Other | Source: Ambulatory Visit | Attending: Radiation Oncology | Admitting: Radiation Oncology

## 2019-09-13 ENCOUNTER — Other Ambulatory Visit: Payer: Self-pay

## 2019-09-13 DIAGNOSIS — C8191 Hodgkin lymphoma, unspecified, lymph nodes of head, face, and neck: Secondary | ICD-10-CM

## 2019-09-13 DIAGNOSIS — C8100 Nodular lymphocyte predominant Hodgkin lymphoma, unspecified site: Secondary | ICD-10-CM

## 2019-09-13 MED ORDER — SUCRALFATE 1 G PO TABS: ORAL_TABLET | ORAL | 2 refills | Status: DC

## 2019-09-13 MED ORDER — RADIAPLEXRX EX GEL: Freq: Once | CUTANEOUS | Status: AC

## 2019-09-13 MED FILL — SUCRALFATE 1 GM TABLET: 1 | 10 days supply | Qty: 40 | Fill #0

## 2019-09-13 NOTE — Progress Notes (Signed)
Pt here for patient teaching.  Pt given skin care instructions and Sonafine.  Reviewed areas of pertinence such as fatigue, skin changes and throat changes . Pt able to give teach back of to pat skin, use unscented/gentle soap and drink plenty of water,apply Sonafine bid and avoid applying anything to skin within 4 hours of treatment. Pt verbalizes understanding of information given and will contact nursing with any questions or concerns.     [email or web address]

## 2019-09-14 ENCOUNTER — Other Ambulatory Visit: Payer: Self-pay

## 2019-09-14 ENCOUNTER — Ambulatory Visit
Admission: RE | Admit: 2019-09-14 | Discharge: 2019-09-14 | Disposition: A | Discharge status: Home / Self Care | Payer: Medicaid Other | Source: Ambulatory Visit | Attending: Radiation Oncology | Admitting: Radiation Oncology

## 2019-09-15 ENCOUNTER — Other Ambulatory Visit: Payer: Self-pay

## 2019-09-15 ENCOUNTER — Ambulatory Visit
Admission: RE | Admit: 2019-09-15 | Discharge: 2019-09-15 | Disposition: A | Discharge status: Home / Self Care | Payer: Medicaid Other | Source: Ambulatory Visit | Attending: Radiation Oncology | Admitting: Radiation Oncology

## 2019-09-16 ENCOUNTER — Ambulatory Visit
Admission: RE | Admit: 2019-09-16 | Discharge: 2019-09-16 | Disposition: A | Discharge status: Home / Self Care | Payer: Medicaid Other | Source: Ambulatory Visit | Attending: Radiation Oncology | Admitting: Radiation Oncology

## 2019-09-17 ENCOUNTER — Other Ambulatory Visit: Payer: Self-pay

## 2019-09-17 ENCOUNTER — Ambulatory Visit
Admission: RE | Admit: 2019-09-17 | Discharge: 2019-09-17 | Disposition: A | Discharge status: Home / Self Care | Payer: Medicaid Other | Source: Ambulatory Visit | Attending: Radiation Oncology | Admitting: Radiation Oncology

## 2019-09-20 ENCOUNTER — Ambulatory Visit: Payer: Medicaid Other | Admitting: Physician Assistant

## 2019-09-20 ENCOUNTER — Ambulatory Visit
Admission: RE | Admit: 2019-09-20 | Discharge: 2019-09-20 | Disposition: A | Discharge status: Home / Self Care | Payer: Medicaid Other | Source: Ambulatory Visit | Attending: Radiation Oncology | Admitting: Radiation Oncology

## 2019-09-20 ENCOUNTER — Other Ambulatory Visit: Payer: Medicaid Other

## 2019-09-20 ENCOUNTER — Other Ambulatory Visit: Payer: Self-pay

## 2019-09-20 ENCOUNTER — Other Ambulatory Visit: Payer: Self-pay | Admitting: Radiation Oncology

## 2019-09-20 ENCOUNTER — Ambulatory Visit: Payer: Medicaid Other

## 2019-09-20 DIAGNOSIS — C8191 Hodgkin lymphoma, unspecified, lymph nodes of head, face, and neck: Secondary | ICD-10-CM

## 2019-09-20 MED ORDER — LIDOCAINE VISCOUS HCL 2 % MT SOLN: OROMUCOSAL | 3 refills | Status: DC

## 2019-09-20 MED ORDER — HYDROCODONE-ACETAMINOPHEN 7.5-325 MG/15ML PO SOLN: ORAL | 0 refills | Status: DC

## 2019-09-20 MED FILL — HYDROCOD-APAP 7.5-325/15ML: 7.5-325 | 5 days supply | Qty: 473 | Fill #0

## 2019-09-20 MED FILL — LIDOCAINE 2% VISCOUS SOLN: 2 | 10 days supply | Qty: 200 | Fill #0

## 2019-09-21 ENCOUNTER — Ambulatory Visit
Admission: RE | Admit: 2019-09-21 | Discharge: 2019-09-21 | Disposition: A | Discharge status: Home / Self Care | Payer: Medicaid Other | Source: Ambulatory Visit | Attending: Radiation Oncology | Admitting: Radiation Oncology

## 2019-09-21 ENCOUNTER — Other Ambulatory Visit: Payer: Self-pay

## 2019-09-22 ENCOUNTER — Encounter: Payer: Self-pay | Admitting: Radiation Oncology

## 2019-09-22 ENCOUNTER — Other Ambulatory Visit: Payer: Self-pay

## 2019-09-22 ENCOUNTER — Ambulatory Visit
Admission: RE | Admit: 2019-09-22 | Discharge: 2019-09-22 | Disposition: A | Discharge status: Home / Self Care | Payer: Medicaid Other | Source: Ambulatory Visit | Attending: Radiation Oncology | Admitting: Radiation Oncology

## 2019-09-27 NOTE — Progress Notes (Signed)
  Patient Name: Faith Pope MRN: [id number] DOB: 1991-03-24 Referring Physician: Curt Bears (Profile Not Attached) Date of Service: 09/22/2019 [facility], [place]                                                        End Of Treatment Note  Diagnoses: C81.12-Nodular sclerosis Hodgkin lymphoma, intrathoracic lymph nodes  Cancer Staging: Cancer Staging Hodgkin's lymphoma [facility]) Staging form: Hodgkin and Non-Hodgkin Lymphoma, AJCC 8th Edition - Clinical stage from 08/30/2019: Stage II (Hodgkin lymphoma, A - Asymptomatic) - Signed by Eppie Gibson, MD on 08/31/2019  Intent: Curative  Radiation Treatment Dates: 09/08/2019 through 09/22/2019 Site Technique Total Dose (Gy) Dose per Fx (Gy) Completed Fx Beam Energies  Chest: Chest_R_Neck 3D 22/22 2 11/11 6X, 15X   Narrative: The patient tolerated radiation therapy relatively well with some esophagitis.  Plan: The patient will follow-up with radiation oncology in 1/2 mo, or as needed.  -----------------------------------  Eppie Gibson, MD

## 2019-10-07 ENCOUNTER — Encounter: Payer: Self-pay | Admitting: Radiation Oncology

## 2019-10-07 NOTE — Progress Notes (Signed)
Ms. Faith Pope presents for follow up of radiation completed 09/22/19 to her right neck/ chest area. She last saw Dr. Julien Nordmann on 08/23/19 and is scheduled to see him again on 12/20/19. She reports a cough and shortness of breath after treatment. She tells me today that this has improved somewhat since completing treatment. She still has a cough with occasional blood present in her sputum. She also reports vomiting about once daily which she believes is due to throat irritation. She has tried lidocaine rinses without relief due to not liking the texture. She continues to have tenderness to her radiation site including her right neck area and upper chest area. She is applying sonafine but tells me that it burns when she applies it. I have advised her to use a lotion with vitamin E. She is eating well per her report. She does have some minor taste changes and is unable to eat carbonated beverages because they cause vomiting.   BP 106/78 (BP Location: Left Arm, Patient Position: Sitting, Cuff Size: Large)   Pulse 86   Temp 98.7 F (37.1 C)   Resp 18   Wt 191 lb (86.6 kg)   SpO2 100%   BMI 34.93 kg/m    Wt Readings from Last 3 Encounters:  10/08/19 191 lb (86.6 kg)  08/23/19 194 lb 6.4 oz (88.2 kg)  08/04/19 191 lb 12.8 oz (87 kg)

## 2019-10-08 ENCOUNTER — Encounter: Payer: Self-pay | Admitting: Radiation Oncology

## 2019-10-08 ENCOUNTER — Ambulatory Visit
Admission: RE | Admit: 2019-10-08 | Discharge: 2019-10-08 | Disposition: A | Discharge status: Home / Self Care | Payer: Medicaid Other | Source: Ambulatory Visit | Attending: Radiation Oncology | Admitting: Radiation Oncology

## 2019-10-08 ENCOUNTER — Other Ambulatory Visit: Payer: Self-pay

## 2019-10-08 VITALS — BP 106/78 | HR 86 | Temp 98.7°F | Resp 18 | Wt 191.0 lb

## 2019-10-08 DIAGNOSIS — Z79899 Other long term (current) drug therapy: Secondary | ICD-10-CM | POA: Insufficient documentation

## 2019-10-08 DIAGNOSIS — C8118 Nodular sclerosis classical Hodgkin lymphoma, lymph nodes of multiple sites: Secondary | ICD-10-CM | POA: Insufficient documentation

## 2019-10-08 DIAGNOSIS — R05 Cough: Secondary | ICD-10-CM | POA: Insufficient documentation

## 2019-10-08 DIAGNOSIS — Z1329 Encounter for screening for other suspected endocrine disorder: Secondary | ICD-10-CM

## 2019-10-08 DIAGNOSIS — Z7982 Long term (current) use of aspirin: Secondary | ICD-10-CM | POA: Insufficient documentation

## 2019-10-08 DIAGNOSIS — Z923 Personal history of irradiation: Secondary | ICD-10-CM | POA: Insufficient documentation

## 2019-10-08 HISTORY — DX: Personal history of irradiation: Z92.3

## 2019-10-08 MED ORDER — BENZONATATE 100 MG PO CAPS: 100.0000 mg | ORAL_CAPSULE | Freq: Three times a day (TID) | ORAL | 2 refills | Status: DC | PRN

## 2019-10-08 MED FILL — BENZONATATE 100 MG CAPS: 100 | 10 days supply | Qty: 60 | Fill #0

## 2019-10-08 NOTE — Progress Notes (Signed)
Radiation Oncology         [phone] ________________________________  Name: Faith Pope MRN: [id number]  Date: 10/08/2019  DOB: 1991-07-07  Follow-Up Visit Note  Outpatient  CC: Patient, No Pcp Per  Curt Bears, MD  Diagnosis and Prior Radiotherapy:    ICD-10-CM   1. Screening for hypothyroidism  Z13.29 TSH    CANCELED: TSH  2. 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 She last saw Dr. Julien Nordmann on 08/23/19 and is scheduled to see him again on 12/20/19. She reports a cough and shortness of breath after treatment. She tells me today that this has improved somewhat since completing treatment. She still has a cough with occasional blood present in her sputum. She also reports vomiting about once daily which she believes is due to throat irritation. She has tried lidocaine rinses without relief due to not liking the texture. She continues to have tenderness to her radiation site including her right neck area and upper chest area. She is applying sonafine but tells me that it burns when she applies it. I have advised her to use a lotion with vitamin E. She is eating well per her report. She does have some minor taste changes and is unable to eat carbonated beverages because they cause vomiting.            She reports that her main issue is having coughing spasms.  The esophageal discomfort only takes place after coughing.  Shortness of breath only takes place during coughing spasms.  Skin irritation is a secondary issue and is mild.  All of her symptoms are steadily improving since  treatment.  Weight is stable.  She plans to go to [facility] for vacation in the near future  Wt Readings from Last 3 Encounters:  10/08/19 191 lb (86.6 kg)  08/23/19 194 lb 6.4 oz (88.2 kg)  08/04/19 191 lb 12.8 oz (87 kg)                        ALLERGIES:  is allergic to other; shellfish allergy; amoxicillin; and penicillins.  Meds: Current Outpatient Medications  Medication Sig Dispense Refill  . albuterol (VENTOLIN HFA) 108 (90 Base) MCG/ACT inhaler Inhale 1-2 puffs into the lungs every 6 (six) hours as needed for wheezing or shortness of breath.    . allopurinol (ZYLOPRIM) 100 MG tablet Take 1 tablet (100 mg total) by mouth daily. (Patient not taking: Reported on 08/30/2019) 60 tablet 2  . aspirin-acetaminophen-caffeine (EXCEDRIN MIGRAINE) 250-250-65 MG tablet Take 2 tablets by mouth every 6 (six) hours as needed for headache.    . benzonatate (TESSALON) 100 MG capsule Take 1-2 capsules (100-200 mg total) by mouth 3 (three) times daily as needed for cough. 60 capsule 2  . HYDROcodone-acetaminophen (HYCET) 7.5-325 mg/15 ml solution Take 10-15 mL Q4hours PRN severe pain; take with food. Take stool softener (Colace OTC or prune juice) twice daily if this causes constipation. 473 mL 0  . lidocaine (XYLOCAINE) 2 % solution Patient: Mix 1part 2% viscous lidocaine, 1part H20. Swallow 69mL of  diluted mixture, 69min before meals and at bedtime, up to QID 200 mL 3  . lidocaine-prilocaine (EMLA) cream Apply 1 application topically as needed. 30 g 0  . LORazepam (ATIVAN) 0.5 MG tablet Take 1 tablet, 30 min before radiotherapy, PRN anxiety. Do not drive after taking this medication. 10 tablet 0  . omeprazole (PRILOSEC) 40 MG capsule Take 1 capsule (40 mg total) by mouth daily. (Patient not taking: Reported on 08/30/2019) 30 capsule 5  . prochlorperazine (COMPAZINE) 10 MG tablet Take 1 tablet (10 mg total) by mouth every 6 (six) hours as needed for nausea or vomiting. (Patient not taking: Reported on  08/30/2019) 30 tablet 0  . sucralfate (CARAFATE) 1 g tablet Dissolve 1 tablet in 10 mL H20 and swallow 30 min prior to meals and bedtime. 40 tablet 2   No current facility-administered medications for this encounter.    Physical Findings: The patient is in no acute distress. Patient is alert and oriented.  weight is 191 lb (86.6 kg). Her temperature is 98.7 F (37.1 C). Her blood pressure is 106/78 and her pulse is 86. Her respiration is 18 and oxygen saturation is 100%. .    Skin - Mild hyperpigmentation over the right upper chest and lower neck as well as the midline of her back She is not currently coughing.  Lab Findings: Lab Results  Component Value Date   WBC 7.5 08/23/2019   HGB 11.1 (L) 08/23/2019   HCT 33.6 (L) 08/23/2019   MCV 86.8 08/23/2019   PLT 328 08/23/2019    Radiographic Findings: No results found.  Impression/Plan: She is healing well from radiotherapy.  I recommended that she apply cocoa butter to her skin +/- lotion twice a day for a couple more months  For her cough we will transition to trying [employer]  I do not recommend continuing hydrocodone.  Her symptoms are getting better on their own with time.  I will see her back in a year with TSH testing.  She will continue to follow closely with medical oncology.  We discussed that she will need to start surveillance for breast cancer sooner than most women given her radiation history (ie start in her mid 60s).  However this can be postponed for several years. Fortunately she was treated to a relatively low dose of radiation and most of her breast tissue is outside of the fields (with exception to portions of the medial breasts).  On date of service, in total, I spent 35 minutes on this encounter _____________________________________   Eppie Gibson, MD

## 2019-10-22 ENCOUNTER — Other Ambulatory Visit: Payer: Medicaid Other

## 2019-10-22 ENCOUNTER — Telehealth: Payer: Self-pay | Admitting: Medical Oncology

## 2019-10-22 ENCOUNTER — Telehealth: Payer: Self-pay | Admitting: Emergency Medicine

## 2019-10-22 NOTE — Telephone Encounter (Signed)
Pt cancelled port flush today. Schedule message sent .

## 2019-10-22 NOTE — Telephone Encounter (Signed)
Pt called requesting change in flush appt today at 3:30, states she is unable to find her EMLA cream.  Advised pt on using ice to numb port before flush appt, pt states she will try that and keep her appt as scheduled.

## 2019-10-25 ENCOUNTER — Other Ambulatory Visit: Payer: Medicaid Other

## 2019-10-26 ENCOUNTER — Telehealth: Payer: Self-pay | Admitting: Internal Medicine

## 2019-10-26 ENCOUNTER — Telehealth: Payer: Self-pay | Admitting: Medical Oncology

## 2019-10-26 NOTE — Telephone Encounter (Signed)
Rescheduled appt per 3/2 sch msg. Pt is aware of new appt date and time.

## 2019-10-26 NOTE — Telephone Encounter (Signed)
Needs to r/s port flush. Schedule message sent.

## 2019-10-29 ENCOUNTER — Other Ambulatory Visit: Payer: Self-pay

## 2019-10-29 ENCOUNTER — Inpatient Hospital Stay: Payer: Medicaid Other | Attending: Radiation Oncology

## 2019-10-29 DIAGNOSIS — C8118 Nodular sclerosis classical Hodgkin lymphoma, lymph nodes of multiple sites: Secondary | ICD-10-CM | POA: Insufficient documentation

## 2019-10-29 DIAGNOSIS — Z452 Encounter for adjustment and management of vascular access device: Secondary | ICD-10-CM | POA: Insufficient documentation

## 2019-10-29 DIAGNOSIS — Z95828 Presence of other vascular implants and grafts: Secondary | ICD-10-CM

## 2019-10-29 MED ORDER — SODIUM CHLORIDE 0.9% FLUSH
10.0000 mL | INTRAVENOUS | Status: DC | PRN
  Administered 2019-10-29: 10 mL
  Filled 2019-10-29: qty 10

## 2019-10-29 MED ORDER — HEPARIN SOD (PORK) LOCK FLUSH 100 UNIT/ML IV SOLN
500.0000 [IU] | Freq: Once | INTRAVENOUS | Status: AC | PRN
  Administered 2019-10-29: 500 [IU]
  Filled 2019-10-29: qty 5

## 2019-10-29 NOTE — Patient Instructions (Signed)

## 2019-11-17 ENCOUNTER — Ambulatory Visit: Payer: Medicaid Other | Attending: Internal Medicine

## 2019-11-17 DIAGNOSIS — Z20822 Contact with and (suspected) exposure to covid-19: Secondary | ICD-10-CM

## 2019-11-18 LAB — NOVEL CORONAVIRUS, NAA: SARS-CoV-2, NAA: NOT DETECTED

## 2019-11-18 LAB — SARS-COV-2, NAA 2 DAY TAT

## 2019-12-20 ENCOUNTER — Inpatient Hospital Stay: Payer: Self-pay

## 2019-12-20 ENCOUNTER — Inpatient Hospital Stay: Payer: Self-pay | Attending: Radiation Oncology

## 2019-12-20 ENCOUNTER — Other Ambulatory Visit: Payer: Self-pay

## 2019-12-20 ENCOUNTER — Ambulatory Visit (HOSPITAL_COMMUNITY)
Admission: RE | Admit: 2019-12-20 | Discharge: 2019-12-20 | Disposition: A | Discharge status: Home / Self Care | Payer: Self-pay | Source: Ambulatory Visit | Attending: Internal Medicine | Admitting: Internal Medicine

## 2019-12-20 ENCOUNTER — Encounter (HOSPITAL_COMMUNITY): Payer: Self-pay

## 2019-12-20 DIAGNOSIS — Z79899 Other long term (current) drug therapy: Secondary | ICD-10-CM | POA: Insufficient documentation

## 2019-12-20 DIAGNOSIS — C8111 Nodular sclerosis classical Hodgkin lymphoma, lymph nodes of head, face, and neck: Secondary | ICD-10-CM

## 2019-12-20 DIAGNOSIS — C8118 Nodular sclerosis classical Hodgkin lymphoma, lymph nodes of multiple sites: Secondary | ICD-10-CM | POA: Insufficient documentation

## 2019-12-20 DIAGNOSIS — C8102 Nodular lymphocyte predominant Hodgkin lymphoma, intrathoracic lymph nodes: Secondary | ICD-10-CM | POA: Insufficient documentation

## 2019-12-20 DIAGNOSIS — Z95828 Presence of other vascular implants and grafts: Secondary | ICD-10-CM

## 2019-12-20 LAB — CMP (CANCER CENTER ONLY)
ALT: 27 U/L (ref 0–44)
AST: 22 U/L (ref 15–41)
Albumin: 3.8 g/dL (ref 3.5–5.0)
Alkaline Phosphatase: 72 U/L (ref 38–126)
Anion gap: 10 (ref 5–15)
BUN: 10 mg/dL (ref 6–20)
CO2: 23 mmol/L (ref 22–32)
Calcium: 9.1 mg/dL (ref 8.9–10.3)
Chloride: 106 mmol/L (ref 98–111)
Creatinine: 0.7 mg/dL (ref 0.44–1.00)
GFR, Est AFR Am: >60 mL/min (ref >60)
GFR, Estimated: >60 mL/min (ref >60)
Glucose, Bld: 95 mg/dL (ref 70–99)
Potassium: 3.9 mmol/L (ref 3.5–5.1)
Sodium: 139 mmol/L (ref 135–145)
Total Bilirubin: 0.4 mg/dL (ref 0.3–1.2)
Total Protein: 7.3 g/dL (ref 6.5–8.1)

## 2019-12-20 LAB — CBC WITH DIFFERENTIAL (CANCER CENTER ONLY)
Abs Immature Granulocytes: 0.01 10*3/uL (ref 0.00–0.07)
Basophils Absolute: 0 10*3/uL (ref 0.0–0.1)
Basophils Relative: 1 %
Eosinophils Absolute: 0.3 10*3/uL (ref 0.0–0.5)
Eosinophils Relative: 7 %
HCT: 38 % (ref 36.0–46.0)
Hemoglobin: 12.5 g/dL (ref 12.0–15.0)
Immature Granulocytes: 0 %
Lymphocytes Relative: 34 %
Lymphs Abs: 1.5 10*3/uL (ref 0.7–4.0)
MCH: 28.5 pg (ref 26.0–34.0)
MCHC: 32.9 g/dL (ref 30.0–36.0)
MCV: 86.8 fL (ref 80.0–100.0)
Monocytes Absolute: 0.4 10*3/uL (ref 0.1–1.0)
Monocytes Relative: 9 %
Neutro Abs: 2.1 10*3/uL (ref 1.7–7.7)
Neutrophils Relative %: 49 %
Platelet Count: 336 10*3/uL (ref 150–400)
RBC: 4.38 MIL/uL (ref 3.87–5.11)
RDW: 13 % (ref 11.5–15.5)
WBC Count: 4.3 10*3/uL (ref 4.0–10.5)
nRBC: 0 % (ref 0.0–0.2)

## 2019-12-20 LAB — LACTATE DEHYDROGENASE: LDH: 186 U/L (ref 98–192)

## 2019-12-20 LAB — PREGNANCY, URINE: Preg Test, Ur: NEGATIVE

## 2019-12-20 MED ORDER — IOHEXOL 300 MG/ML  SOLN
75.0000 mL | Freq: Once | INTRAMUSCULAR | Status: AC | PRN
  Administered 2019-12-20: 75 mL via INTRAVENOUS

## 2019-12-20 MED ORDER — HEPARIN SOD (PORK) LOCK FLUSH 100 UNIT/ML IV SOLN
INTRAVENOUS | Status: AC
  Filled 2019-12-20: qty 5

## 2019-12-20 MED ORDER — HEPARIN SOD (PORK) LOCK FLUSH 100 UNIT/ML IV SOLN
500.0000 [IU] | Freq: Once | INTRAVENOUS | Status: AC
  Administered 2019-12-20: 500 [IU] via INTRAVENOUS

## 2019-12-20 MED ORDER — SODIUM CHLORIDE 0.9% FLUSH
10.0000 mL | INTRAVENOUS | Status: DC | PRN
  Administered 2019-12-20: 10 mL
  Filled 2019-12-20: qty 10

## 2019-12-20 MED ORDER — SODIUM CHLORIDE (PF) 0.9 % IJ SOLN
INTRAMUSCULAR | Status: AC
  Filled 2019-12-20: qty 50

## 2019-12-22 ENCOUNTER — Encounter: Payer: Self-pay | Admitting: Internal Medicine

## 2019-12-22 ENCOUNTER — Inpatient Hospital Stay (HOSPITAL_BASED_OUTPATIENT_CLINIC_OR_DEPARTMENT_OTHER): Payer: Self-pay | Admitting: Internal Medicine

## 2019-12-22 ENCOUNTER — Other Ambulatory Visit: Payer: Self-pay

## 2019-12-22 VITALS — BP 124/72 | HR 81 | Temp 97.8°F | Resp 18 | Ht 62.0 in | Wt 189.3 lb

## 2019-12-22 DIAGNOSIS — C8111 Nodular sclerosis classical Hodgkin lymphoma, lymph nodes of head, face, and neck: Secondary | ICD-10-CM

## 2019-12-22 DIAGNOSIS — F17290 Nicotine dependence, other tobacco product, uncomplicated: Secondary | ICD-10-CM

## 2019-12-22 DIAGNOSIS — C8112 Nodular sclerosis classical Hodgkin lymphoma, intrathoracic lymph nodes: Secondary | ICD-10-CM

## 2019-12-22 MED ORDER — LIDOCAINE-PRILOCAINE 2.5-2.5 % EX CREA: 1.0000 "application " | TOPICAL_CREAM | CUTANEOUS | 0 refills | Status: DC | PRN

## 2019-12-22 MED FILL — LIDOCAINE-PRILOCAINE CREAM: 2.5-2.5 | 30 days supply | Qty: 30 | Fill #0

## 2019-12-22 NOTE — Progress Notes (Signed)
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  PRIOR THERAPY:   1) Systemic chemotherapy with ABVD on days 1 and 15 every 4 weeks.  First dose June 23, 2019.  She is status post 2 cycles.  2) involved field radiotherapy under the care of Dr. Isidore Moos  CURRENT THERAPY: None.  INTERVAL HISTORY: Faith Pope 29 y.o. female returns to the clinic today for follow-up visit accompanied by her girlfriend.  The patient is feeling fine today with no concerning complaints.  She has a rough time with radiotherapy.  She denied having any current chest pain, shortness of breath, cough or hemoptysis.  She denied having any fever or chills.  She has no nausea, vomiting, diarrhea or constipation.  She denied having any headache or visual changes.  The patient is here today for evaluation with repeat CT scan of the chest for restaging of her disease.  MEDICAL HISTORY: Past Medical History:  Diagnosis Date  . Asthma   . Cancer ([place])    Hodgkin Lymphoma diagnosed 04/2019  . History of radiation therapy 09/08/19- 09/22/19   Right neck/ chest 11 fractions of 2 Gy each to total 22 Gy.     ALLERGIES:  is allergic to other; shellfish allergy; amoxicillin; and penicillins.  MEDICATIONS:  Current Outpatient Medications  Medication Sig Dispense Refill  . albuterol (VENTOLIN HFA) 108 (90 Base) MCG/ACT inhaler Inhale 1-2 puffs into the lungs every 6 (six) hours as needed for wheezing or shortness of breath.    . allopurinol (ZYLOPRIM) 100 MG tablet Take 1 tablet (100 mg total) by mouth daily. (Patient not taking: Reported on 08/30/2019) 60 tablet 2  . aspirin-acetaminophen-caffeine (EXCEDRIN MIGRAINE) 250-250-65 MG tablet Take 2 tablets by mouth every 6 (six) hours as needed for headache.    . benzonatate (TESSALON) 100 MG  capsule Take 1-2 capsules (100-200 mg total) by mouth 3 (three) times daily as needed for cough. 60 capsule 2  . HYDROcodone-acetaminophen (HYCET) 7.5-325 mg/15 ml solution Take 10-15 mL Q4hours PRN severe pain; take with food. Take stool softener (Colace OTC or prune juice) twice daily if this causes constipation. 473 mL 0  . lidocaine (XYLOCAINE) 2 % solution Patient: Mix 1part 2% viscous lidocaine, 1part H20. Swallow 46m of diluted mixture, 343m before meals and at bedtime, up to QID 200 mL 3  . lidocaine-prilocaine (EMLA) cream Apply 1 application topically as needed. 30 g 0  . LORazepam (ATIVAN) 0.5 MG tablet Take 1 tablet, 30 min before radiotherapy, PRN anxiety. Do not drive after taking this medication. 10 tablet 0  . omeprazole (PRILOSEC) 40 MG capsule Take 1 capsule (40 mg total) by mouth daily. (Patient not taking: Reported on 08/30/2019) 30 capsule 5  . prochlorperazine (COMPAZINE) 10 MG tablet Take 1 tablet (10 mg total) by mouth every 6 (six) hours as needed for nausea or vomiting. (Patient not taking: Reported on 08/30/2019) 30 tablet 0  . sucralfate (CARAFATE) 1 g tablet Dissolve 1 tablet in 10 mL H20 and swallow 30 min prior to meals and bedtime. 40 tablet 2   No current facility-administered medications for this visit.    SURGICAL HISTORY:  Past Surgical History:  Procedure Laterality Date  . IR BONE MARROW BIOPSY & ASPIRATION  06/21/2019  . IR IMAGING GUIDED PORT INSERTION  06/21/2019  . MASS BIOPSY  Right 05/11/2019   Procedure: EXCISIONAL BIOPSY OF RIGHT CERVICAL LYMPH NODE;  Surgeon: Melida Quitter, MD;  Location: [facility];  Service: ENT;  Laterality: Right;  . TONSILLECTOMY      REVIEW OF SYSTEMS:  A comprehensive review of systems was negative.   PHYSICAL EXAMINATION: General appearance: alert, cooperative and no distress Head: Normocephalic, without obvious abnormality, atraumatic Neck: no adenopathy, no JVD, supple, symmetrical, trachea midline and thyroid not enlarged,  symmetric, no tenderness/mass/nodules Lymph nodes: Cervical, supraclavicular, and axillary nodes normal. Resp: clear to auscultation bilaterally Back: negative, no kyphosis present, symmetric, no curvature. ROM normal. No CVA tenderness. Cardio: regular rate and rhythm, S1, S2 normal, no murmur, click, rub or gallop GI: soft, non-tender; bowel sounds normal; no masses,  no organomegaly Extremities: extremities normal, atraumatic, no cyanosis or edema  ECOG PERFORMANCE STATUS: 0 - Asymptomatic  Blood pressure 124/72, pulse 81, temperature 97.8 F (36.6 C), temperature source Temporal, resp. rate 18, height 5' 2"  (1.575 m), weight 189 lb 4.8 oz (85.9 kg), last menstrual period 12/17/2019, SpO2 100 %.  LABORATORY DATA: Lab Results  Component Value Date   WBC 4.3 12/20/2019   HGB 12.5 12/20/2019   HCT 38.0 12/20/2019   MCV 86.8 12/20/2019   PLT 336 12/20/2019      Chemistry      Component Value Date/Time   NA 139 12/20/2019 1444   K 3.9 12/20/2019 1444   CL 106 12/20/2019 1444   CO2 23 12/20/2019 1444   BUN 10 12/20/2019 1444   CREATININE 0.70 12/20/2019 1444      Component Value Date/Time   CALCIUM 9.1 12/20/2019 1444   ALKPHOS 72 12/20/2019 1444   AST 22 12/20/2019 1444   ALT 27 12/20/2019 1444   BILITOT 0.4 12/20/2019 1444       RADIOGRAPHIC STUDIES: CT Chest W Contrast  Result Date: 12/21/2019 CLINICAL DATA:  Primary Cancer Type: Lymphoma Imaging Indication: Assess response to therapy Interval therapy since last imaging? Yes Initial Cancer Diagnosis Date: 05/11/2019 Established by: Biopsy-proven, right cervical lymph node. 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 Primary Tumor location: Right supraclavicular fossa and upper mediastinal lymphadenopathy. Chemotherapy: Yes Ongoing? No Most recent administration: 08/04/2019 ABVD Radiation therapy? Yes Date Range: 09/08/2019-09/22/2019 Target: Chest and right neck. EXAM: CT CHEST WITH  CONTRAST TECHNIQUE: Multidetector CT imaging of the chest was performed during intravenous contrast administration. CONTRAST:  43m OMNIPAQUE IOHEXOL 300 MG/ML  SOLN COMPARISON:  CT neck 01/22/2019. Most recent PET-CT 08/17/2019. FINDINGS: Cardiovascular: Top-normal heart size. No significant pericardial effusion/thickening. Right internal jugular Port-A-Cath terminates near the cavoatrial junction. Great vessels are normal in course and caliber. No central pulmonary emboli. Mediastinum/Nodes: No discrete thyroid nodules. Unremarkable esophagus. No enlarged axillary lymph nodes. No residual enlarged supraclavicular nodes. Mildly enlarged prevascular lymph nodes measuring 1.2 cm on the right (series 2/image 51) and 1.1 cm near the midline (series 2/image 41), unchanged from 08/17/2019 PET-CT. Mildly enlarged right paratracheal lymph nodes up to 1.3 cm (series 2/image 35), previously 1.4 cm, not appreciably changed. No new pathologically enlarged mediastinal nodes. No pathologically enlarged hilar lymph nodes. Lungs/Pleura: No pneumothorax. No pleural effusion. Mild patchy ground-glass opacity in the paramediastinal right lung, most prominent in the right upper lobe, new. Two tiny clustered solid pulmonary nodules along the minor fissure in the right lung, largest 3 mm (series 7/image 55), stable since 06/11/2019 PET-CT, considered benign. No acute consolidative airspace disease, lung masses or new significant pulmonary nodules. Stable varicoid bronchiectasis with mucoid impaction in  the medial basilar right lower lobe. Upper abdomen: No acute abnormality. Musculoskeletal:  No aggressive appearing focal osseous lesions. IMPRESSION: 1. Stable mild prevascular and right paratracheal mediastinal lymphadenopathy compared to 08/17/2019 PET-CT. 2. No new or progressive lymphoma in the chest. 3. New mild patchy ground-glass opacity in the paramediastinal right lung, compatible with early postradiation change. 4. Stable  varicoid bronchiectasis with mucoid impaction in the medial basilar right lower lobe. Electronically Signed   By: Ilona Sorrel M.D.   On: 12/21/2019 09:57    ASSESSMENT AND PLAN: This is a very pleasant 29 years old African-American female recently diagnosed with a stage IIA Hodgkin lymphoma, nodular sclerosing subtype diagnosed in October 2020 and presented with right cervical and supraclavicular lymphadenopathy as well as right mediastinal lymphadenopathy.  The patient has no evidence of disease below the diaphragm. She does not have a bulky disease and she has no significant unfavorable risk factors. The patient is currently undergoing systemic chemotherapy with ABVD status post 2 cycles. This was followed by involved field radiation under the care of Dr. Isidore Moos. The patient is feeling fine today with no concerning complaints. She had repeat CT scan of the chest performed recently.  I personally and independently reviewed the scans and discussed the results with the patient today. Her scan showed no concerning findings for disease recurrence or metastasis. I recommended for her to continue on observation with repeat CT scan of the chest in 6 months. She will have Port-A-Cath flush every 2 months. The patient will come back for follow-up visit at that time. She was advised to call immediately if she has any concerning symptoms in the interval.  The patient voices understanding of current disease status and treatment options and is in agreement with the current care plan.  All questions were answered. The patient knows to call the clinic with any problems, questions or concerns. We can certainly see the patient much sooner if necessary.   Disclaimer: This note was dictated with voice recognition software. Similar sounding words can inadvertently be transcribed and may not be corrected upon review.

## 2019-12-23 ENCOUNTER — Telehealth: Payer: Self-pay | Admitting: Internal Medicine

## 2019-12-23 NOTE — Telephone Encounter (Signed)
Scheduled per los. Called and spoke with patient. Confirmed appt 

## 2020-02-21 ENCOUNTER — Inpatient Hospital Stay: Payer: Self-pay | Attending: Radiation Oncology

## 2020-02-22 ENCOUNTER — Telehealth: Payer: Self-pay | Admitting: Internal Medicine

## 2020-02-22 NOTE — Telephone Encounter (Signed)
Patient called to reschedule 6/28 missed appt. Rescheduled and confirmed 7/1 appt with pt

## 2020-02-24 ENCOUNTER — Other Ambulatory Visit: Payer: Self-pay

## 2020-02-24 ENCOUNTER — Inpatient Hospital Stay: Payer: Self-pay | Attending: Radiation Oncology

## 2020-02-24 DIAGNOSIS — C8118 Nodular sclerosis classical Hodgkin lymphoma, lymph nodes of multiple sites: Secondary | ICD-10-CM | POA: Insufficient documentation

## 2020-02-24 DIAGNOSIS — Z95828 Presence of other vascular implants and grafts: Secondary | ICD-10-CM

## 2020-02-24 MED ORDER — SODIUM CHLORIDE 0.9% FLUSH
10.0000 mL | INTRAVENOUS | Status: DC | PRN
  Administered 2020-02-24: 10 mL
  Filled 2020-02-24: qty 10

## 2020-02-24 MED ORDER — HEPARIN SOD (PORK) LOCK FLUSH 100 UNIT/ML IV SOLN
500.0000 [IU] | Freq: Once | INTRAVENOUS | Status: AC | PRN
  Administered 2020-02-24: 500 [IU]
  Filled 2020-02-24: qty 5

## 2020-04-24 ENCOUNTER — Other Ambulatory Visit: Payer: Self-pay

## 2020-04-24 ENCOUNTER — Inpatient Hospital Stay: Payer: Self-pay | Attending: Radiation Oncology

## 2020-04-24 DIAGNOSIS — C8118 Nodular sclerosis classical Hodgkin lymphoma, lymph nodes of multiple sites: Secondary | ICD-10-CM | POA: Insufficient documentation

## 2020-04-24 DIAGNOSIS — Z452 Encounter for adjustment and management of vascular access device: Secondary | ICD-10-CM | POA: Insufficient documentation

## 2020-04-24 DIAGNOSIS — Z95828 Presence of other vascular implants and grafts: Secondary | ICD-10-CM

## 2020-04-24 MED ORDER — SODIUM CHLORIDE 0.9% FLUSH
10.0000 mL | INTRAVENOUS | Status: DC | PRN
  Administered 2020-04-24: 10 mL
  Filled 2020-04-24: qty 10

## 2020-04-24 MED ORDER — HEPARIN SOD (PORK) LOCK FLUSH 100 UNIT/ML IV SOLN
500.0000 [IU] | Freq: Once | INTRAVENOUS | Status: AC | PRN
  Administered 2020-04-24: 500 [IU]
  Filled 2020-04-24: qty 5

## 2020-05-30 ENCOUNTER — Telehealth: Payer: Self-pay | Admitting: Internal Medicine

## 2020-05-30 NOTE — Telephone Encounter (Signed)
R/s 10/28 to earlier time due to provider PAL. Called and spoke with patient. Confirmed new time

## 2020-06-20 ENCOUNTER — Ambulatory Visit (HOSPITAL_COMMUNITY)
Admission: RE | Admit: 2020-06-20 | Discharge: 2020-06-20 | Disposition: A | Discharge status: Home / Self Care | Payer: Self-pay | Source: Ambulatory Visit | Attending: Internal Medicine | Admitting: Internal Medicine

## 2020-06-20 ENCOUNTER — Encounter (HOSPITAL_COMMUNITY): Payer: Self-pay

## 2020-06-20 ENCOUNTER — Inpatient Hospital Stay: Payer: Medicaid Other

## 2020-06-20 ENCOUNTER — Inpatient Hospital Stay: Payer: Medicaid Other | Attending: Radiation Oncology

## 2020-06-20 ENCOUNTER — Other Ambulatory Visit: Payer: Self-pay

## 2020-06-20 DIAGNOSIS — C8112 Nodular sclerosis classical Hodgkin lymphoma, intrathoracic lymph nodes: Secondary | ICD-10-CM

## 2020-06-20 DIAGNOSIS — C8590 Non-Hodgkin lymphoma, unspecified, unspecified site: Secondary | ICD-10-CM | POA: Insufficient documentation

## 2020-06-20 DIAGNOSIS — Z79899 Other long term (current) drug therapy: Secondary | ICD-10-CM | POA: Insufficient documentation

## 2020-06-20 DIAGNOSIS — Z95828 Presence of other vascular implants and grafts: Secondary | ICD-10-CM

## 2020-06-20 LAB — CBC WITH DIFFERENTIAL (CANCER CENTER ONLY)
Abs Immature Granulocytes: 0.02 10*3/uL (ref 0.00–0.07)
Basophils Absolute: 0 10*3/uL (ref 0.0–0.1)
Basophils Relative: 0 %
Eosinophils Absolute: 0.3 10*3/uL (ref 0.0–0.5)
Eosinophils Relative: 4 %
HCT: 34.9 % — ABNORMAL LOW (ref 36.0–46.0)
Hemoglobin: 11.7 g/dL — ABNORMAL LOW (ref 12.0–15.0)
Immature Granulocytes: 0 %
Lymphocytes Relative: 29 %
Lymphs Abs: 1.9 10*3/uL (ref 0.7–4.0)
MCH: 28.7 pg (ref 26.0–34.0)
MCHC: 33.5 g/dL (ref 30.0–36.0)
MCV: 85.5 fL (ref 80.0–100.0)
Monocytes Absolute: 0.5 10*3/uL (ref 0.1–1.0)
Monocytes Relative: 7 %
Neutro Abs: 3.9 10*3/uL (ref 1.7–7.7)
Neutrophils Relative %: 60 %
Platelet Count: 313 10*3/uL (ref 150–400)
RBC: 4.08 MIL/uL (ref 3.87–5.11)
RDW: 13.1 % (ref 11.5–15.5)
WBC Count: 6.6 10*3/uL (ref 4.0–10.5)
nRBC: 0 % (ref 0.0–0.2)

## 2020-06-20 LAB — CMP (CANCER CENTER ONLY)
ALT: 17 U/L (ref 0–44)
AST: 17 U/L (ref 15–41)
Albumin: 3.8 g/dL (ref 3.5–5.0)
Alkaline Phosphatase: 66 U/L (ref 38–126)
Anion gap: 7 (ref 5–15)
BUN: 12 mg/dL (ref 6–20)
CO2: 24 mmol/L (ref 22–32)
Calcium: 9.2 mg/dL (ref 8.9–10.3)
Chloride: 107 mmol/L (ref 98–111)
Creatinine: 0.64 mg/dL (ref 0.44–1.00)
GFR, Estimated: >60 mL/min (ref >60)
Glucose, Bld: 90 mg/dL (ref 70–99)
Potassium: 3.6 mmol/L (ref 3.5–5.1)
Sodium: 138 mmol/L (ref 135–145)
Total Bilirubin: 0.5 mg/dL (ref 0.3–1.2)
Total Protein: 7 g/dL (ref 6.5–8.1)

## 2020-06-20 LAB — LACTATE DEHYDROGENASE: LDH: 176 U/L (ref 98–192)

## 2020-06-20 MED ORDER — SODIUM CHLORIDE 0.9% FLUSH
10.0000 mL | INTRAVENOUS | Status: DC | PRN
  Administered 2020-06-20: 10 mL
  Filled 2020-06-20: qty 10

## 2020-06-20 MED ORDER — IOHEXOL 300 MG/ML  SOLN
75.0000 mL | Freq: Once | INTRAMUSCULAR | Status: AC | PRN
  Administered 2020-06-20: 75 mL via INTRAVENOUS

## 2020-06-20 MED ORDER — HEPARIN SOD (PORK) LOCK FLUSH 100 UNIT/ML IV SOLN
500.0000 [IU] | Freq: Once | INTRAVENOUS | Status: AC
  Administered 2020-06-20: 500 [IU] via INTRAVENOUS

## 2020-06-20 MED ORDER — HEPARIN SOD (PORK) LOCK FLUSH 100 UNIT/ML IV SOLN
INTRAVENOUS | Status: AC
  Filled 2020-06-20: qty 5

## 2020-06-22 ENCOUNTER — Other Ambulatory Visit: Payer: Self-pay

## 2020-06-22 ENCOUNTER — Encounter: Payer: Self-pay | Admitting: Internal Medicine

## 2020-06-22 ENCOUNTER — Ambulatory Visit: Payer: Medicaid Other | Admitting: Internal Medicine

## 2020-06-22 ENCOUNTER — Inpatient Hospital Stay (HOSPITAL_BASED_OUTPATIENT_CLINIC_OR_DEPARTMENT_OTHER): Payer: Self-pay | Admitting: Internal Medicine

## 2020-06-22 ENCOUNTER — Other Ambulatory Visit: Payer: Self-pay | Admitting: Internal Medicine

## 2020-06-22 VITALS — BP 118/78 | HR 84 | Temp 97.1°F | Resp 20 | Ht 62.0 in | Wt 190.8 lb

## 2020-06-22 DIAGNOSIS — C8112 Nodular sclerosis classical Hodgkin lymphoma, intrathoracic lymph nodes: Secondary | ICD-10-CM

## 2020-06-22 DIAGNOSIS — C8111 Nodular sclerosis classical Hodgkin lymphoma, lymph nodes of head, face, and neck: Secondary | ICD-10-CM

## 2020-06-22 MED ORDER — LIDOCAINE-PRILOCAINE 2.5-2.5 % EX CREA: 1.0000 "application " | TOPICAL_CREAM | CUTANEOUS | 0 refills | Status: DC | PRN

## 2020-06-22 MED FILL — LIDOCAINE-PRILOCAINE CREAM: 2.5-2.5 | 30 days supply | Qty: 30 | Fill #0

## 2020-06-22 NOTE — Progress Notes (Signed)
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  PRIOR THERAPY:   1) Systemic chemotherapy with ABVD on days 1 and 15 every 4 weeks.  First dose June 23, 2019.  She is status post 2 cycles.  2) involved field radiotherapy under the care of Dr. Isidore Moos  CURRENT THERAPY: Observation.  INTERVAL HISTORY: Faith Pope 29 y.o. female returns to the clinic today for 6 months follow-up visit accompanied by her female partner [place].  The patient is feeling fine with no concerning complaints.  She denied having any chest pain, shortness of breath, cough or hemoptysis.  She denied having any fever or chills.  She has no nausea, vomiting, diarrhea or constipation.  She has no headache or visual changes.  She has no significant weight loss or night sweats.  She is here today for evaluation with repeat CT scan of the chest for restaging of her disease.   MEDICAL HISTORY: Past Medical History:  Diagnosis Date  . Asthma   . Cancer ([place])    Hodgkin Lymphoma diagnosed 04/2019  . History of radiation therapy 09/08/19- 09/22/19   Right neck/ chest 11 fractions of 2 Gy each to total 22 Gy.     ALLERGIES:  is allergic to other, shellfish allergy, amoxicillin, and penicillins.  MEDICATIONS:  Current Outpatient Medications  Medication Sig Dispense Refill  . albuterol (VENTOLIN HFA) 108 (90 Base) MCG/ACT inhaler Inhale 1-2 puffs into the lungs every 6 (six) hours as needed for wheezing or shortness of breath.    . allopurinol (ZYLOPRIM) 100 MG tablet Take 1 tablet (100 mg total) by mouth daily. (Patient not taking: Reported on 08/30/2019) 60 tablet 2  . aspirin-acetaminophen-caffeine (EXCEDRIN MIGRAINE) 250-250-65 MG tablet Take 2 tablets by mouth every 6 (six) hours as needed for headache.    . benzonatate (TESSALON) 100  MG capsule Take 1-2 capsules (100-200 mg total) by mouth 3 (three) times daily as needed for cough. 60 capsule 2  . HYDROcodone-acetaminophen (HYCET) 7.5-325 mg/15 ml solution Take 10-15 mL Q4hours PRN severe pain; take with food. Take stool softener (Colace OTC or prune juice) twice daily if this causes constipation. 473 mL 0  . lidocaine (XYLOCAINE) 2 % solution Patient: Mix 1part 2% viscous lidocaine, 1part H20. Swallow 31m of diluted mixture, 344m before meals and at bedtime, up to QID 200 mL 3  . lidocaine-prilocaine (EMLA) cream Apply 1 application topically as needed. 30 g 0  . LORazepam (ATIVAN) 0.5 MG tablet Take 1 tablet, 30 min before radiotherapy, PRN anxiety. Do not drive after taking this medication. 10 tablet 0  . omeprazole (PRILOSEC) 40 MG capsule Take 1 capsule (40 mg total) by mouth daily. (Patient not taking: Reported on 08/30/2019) 30 capsule 5  . prochlorperazine (COMPAZINE) 10 MG tablet Take 1 tablet (10 mg total) by mouth every 6 (six) hours as needed for nausea or vomiting. (Patient not taking: Reported on 08/30/2019) 30 tablet 0  . sucralfate (CARAFATE) 1 g tablet Dissolve 1 tablet in 10 mL H20 and swallow 30 min prior to meals and bedtime. 40 tablet 2   No current facility-administered medications for this visit.    SURGICAL HISTORY:  Past Surgical History:  Procedure Laterality Date  . IR BONE MARROW BIOPSY & ASPIRATION  06/21/2019  . IR IMAGING GUIDED PORT INSERTION  06/21/2019  .  MASS BIOPSY Right 05/11/2019   Procedure: EXCISIONAL BIOPSY OF RIGHT CERVICAL LYMPH NODE;  Surgeon: Melida Quitter, MD;  Location: [facility];  Service: ENT;  Laterality: Right;  . TONSILLECTOMY      REVIEW OF SYSTEMS:  A comprehensive review of systems was negative.   PHYSICAL EXAMINATION: General appearance: alert, cooperative and no distress Head: Normocephalic, without obvious abnormality, atraumatic Neck: no adenopathy, no JVD, supple, symmetrical, trachea midline and thyroid not enlarged,  symmetric, no tenderness/mass/nodules Lymph nodes: Cervical, supraclavicular, and axillary nodes normal. Resp: clear to auscultation bilaterally Back: negative, no kyphosis present, symmetric, no curvature. ROM normal. No CVA tenderness. Cardio: regular rate and rhythm, S1, S2 normal, no murmur, click, rub or gallop GI: soft, non-tender; bowel sounds normal; no masses,  no organomegaly Extremities: extremities normal, atraumatic, no cyanosis or edema  ECOG PERFORMANCE STATUS: 0 - Asymptomatic  Blood pressure 118/78, pulse 84, temperature (!) 97.1 F (36.2 C), temperature source Tympanic, resp. rate 20, height 5' 2"  (1.575 m), weight 190 lb 12.8 oz (86.5 kg), last menstrual period 06/20/2020, SpO2 100 %.  LABORATORY DATA: Lab Results  Component Value Date   WBC 6.6 06/20/2020   HGB 11.7 (L) 06/20/2020   HCT 34.9 (L) 06/20/2020   MCV 85.5 06/20/2020   PLT 313 06/20/2020      Chemistry      Component Value Date/Time   NA 138 06/20/2020 1335   K 3.6 06/20/2020 1335   CL 107 06/20/2020 1335   CO2 24 06/20/2020 1335   BUN 12 06/20/2020 1335   CREATININE 0.64 06/20/2020 1335      Component Value Date/Time   CALCIUM 9.2 06/20/2020 1335   ALKPHOS 66 06/20/2020 1335   AST 17 06/20/2020 1335   ALT 17 06/20/2020 1335   BILITOT 0.5 06/20/2020 1335       RADIOGRAPHIC STUDIES: CT Chest W Contrast  Result Date: 06/21/2020 CLINICAL DATA:  Lymphoma EXAM: CT CHEST WITH CONTRAST TECHNIQUE: Multidetector CT imaging of the chest was performed during intravenous contrast administration. CONTRAST:  36m OMNIPAQUE IOHEXOL 300 MG/ML  SOLN COMPARISON:  CT chest, 12/20/2019, PET-CT, 08/17/2019 FINDINGS: Cardiovascular: Right chest port catheter. Normal heart size. No pericardial effusion. Mediastinum/Nodes: Enlarged pretracheal, right paratracheal, subcarinal, and superior mediastinal lymph nodes are slightly decreased in size, an index node measuring 1.2 x 1.1 cm, previously 1.4 x 1.3 cm (series  2, image 46). Thymic remnant in the anterior mediastinum. Thyroid gland, trachea, and esophagus demonstrate no significant findings. Lungs/Pleura: Unchanged focal bronchiectasis of the dependent right lower lobe with mucoid impaction (series 7, image 99). No pleural effusion or pneumothorax. Upper Abdomen: No acute abnormality. Musculoskeletal: No chest wall mass or suspicious bone lesions identified. IMPRESSION: 1. Enlarged pretracheal, right paratracheal, subcarinal, and superior mediastinal lymph nodes are slightly decreased in size, consistent with treatment response. 2. Unchanged focal bronchiectasis of the dependent right lower lobe with mucoid impaction, sequelae of prior infection or aspiration. Electronically Signed   By: AEddie CandleM.D.   On: 06/21/2020 08:35    ASSESSMENT AND PLAN: This is a very pleasant 29years old African-American female recently diagnosed with a stage IIA Hodgkin lymphoma, nodular sclerosing subtype diagnosed in October 2020 and presented with right cervical and supraclavicular lymphadenopathy as well as right mediastinal lymphadenopathy.  The patient has no evidence of disease below the diaphragm. She does not have a bulky disease and she has no significant unfavorable risk factors. The patient is currently undergoing systemic chemotherapy with ABVD status post 2 cycles. This  was followed by involved field radiation under the care of Dr. Isidore Moos. The patient is currently on observation and she is feeling fine today with no concerning complaints. She had repeat CT scan of the chest performed recently.  I personally and independently reviewed the scans and discussed the results with the patient and her friend today. Her scan showed no concerning findings for disease progression but she continues to have enlarged pretracheal, right paratracheal and subcarinal and superior mediastinal lymph nodes slightly increased in size compared to the previous imaging studies. I  recommended for the patient to continue on observation with repeat CT scan of the chest in 6 months.  I may consider repeating a PET scan at some point in the future to rule out any residual disease in these lymph nodes. She was advised to call immediately if she has any concerning symptoms in the interval. The patient voices understanding of current disease status and treatment options and is in agreement with the current care plan.  All questions were answered. The patient knows to call the clinic with any problems, questions or concerns. We can certainly see the patient much sooner if necessary.   Disclaimer: This note was dictated with voice recognition software. Similar sounding words can inadvertently be transcribed and may not be corrected upon review.

## 2020-06-27 ENCOUNTER — Telehealth: Payer: Self-pay | Admitting: Internal Medicine

## 2020-06-27 NOTE — Telephone Encounter (Signed)
Scheduled appt per 10/28 LOS - mailed reminder letter with appt date and time

## 2020-07-06 IMAGING — US IR IMAGING GUIDED PORT INSERTION
1 series · 3 of 3 positions shown · non-contrast
Comparison: none

INDICATION: 38-year-old female referred for port catheter placed

[Series 1: ir (id) (id)/(id)/(id) ir · 3 of 3 slices shown]
[im 1/3]
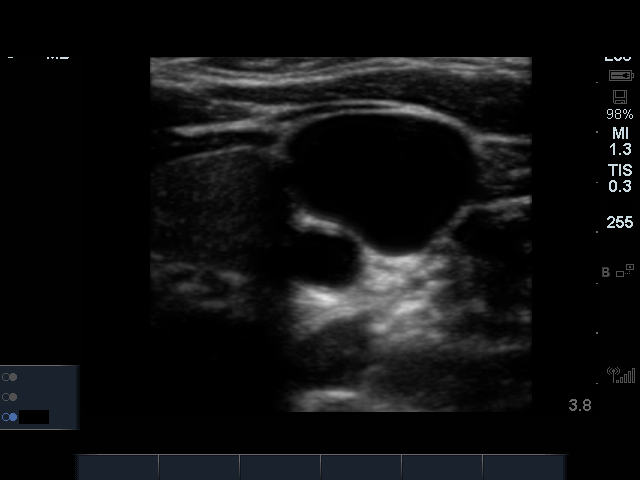
[im 2/3]
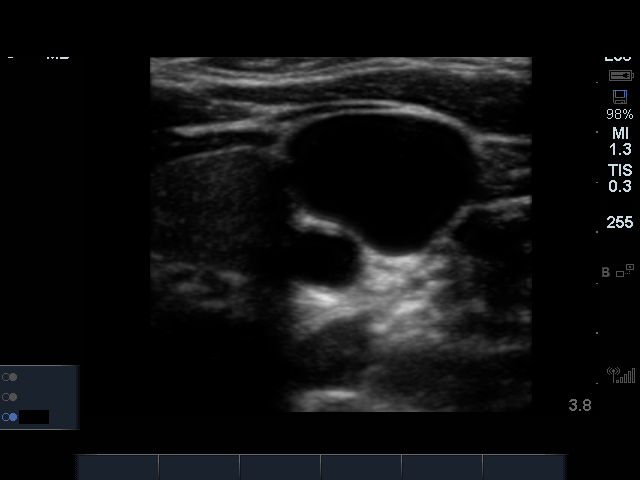
[im 3/3]
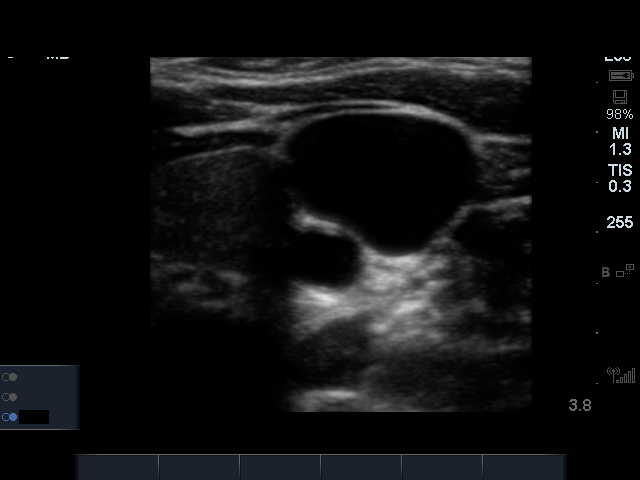

[3 of 3 positions shown; findings below may reference images not displayed]

EXAM:
IMAGE GUIDED PORT CATHETER PLACEMENT

MEDICATIONS:
900 mg Cleocin; The antibiotic was administered within an
appropriate time interval prior to skin puncture.

ANESTHESIA/SEDATION:
Moderate (conscious) sedation was employed during this procedure. A
total of Versed 1.0 mg and Fentanyl 50 mcg was administered
intravenously.

Moderate Sedation Time: 20 minutes. The patient's level of
consciousness and vital signs were monitored continuously by
radiology nursing throughout the procedure under my direct
supervision.

FLUOROSCOPY TIME:  Fluoroscopy Time: 0 minutes 12 seconds (2 mGy).

COMPLICATIONS:
None

PROCEDURE:
The procedure, risks, benefits, and alternatives were explained to
the patient. Questions regarding the procedure were encouraged and
answered. The patient understands and consents to the procedure.

Ultrasound survey was performed with images stored and sent to PACs.

The right neck and chest was prepped with chlorhexidine, and draped
in the usual sterile fashion using maximum barrier technique (cap
and mask, sterile gown, sterile gloves, large sterile sheet, hand
hygiene and cutaneous antiseptic). Antibiotic prophylaxis was
provided with 900g Cleocin administered IV prior to skin incision.
Local anesthesia was attained by infiltration with 1% lidocaine
without epinephrine.

Ultrasound demonstrated patency of the right internal jugular vein,
and this was documented with an image. Under real-time ultrasound
guidance, this vein was accessed with a 21 gauge micropuncture
needle and image documentation was performed. A small dermatotomy
was made at the access site with an 11 scalpel. A 0.018" wire was
advanced into the SVC and used to estimate the length of the
internal catheter. The access needle exchanged for a 4F
micropuncture vascular sheath. The 0.018" wire was then removed and
a 0.035" wire advanced into the IVC.



The venous access site was then serially dilated and a peel away
vascular sheath placed over the wire. The wire was removed and the
port catheter advanced into position under fluoroscopic guidance.
The catheter tip is positioned in the cavoatrial junction. This was
documented with a spot image. The portacatheter was then tested and
found to flush and aspirate well. The port was flushed with saline
followed by 100 units/mL heparinized saline.

The pocket was then closed in two layers using first subdermal
inverted interrupted absorbable sutures followed by a running
subcuticular suture. The epidermis was then sealed with Dermabond.
The dermatotomy at the venous access site was also seal with
Dermabond.

Patient tolerated the procedure well and remained hemodynamically
stable throughout.

No complications encountered and no significant blood loss
encountered
IMPRESSION: Status post right IJ port catheter placement.

## 2020-07-12 ENCOUNTER — Emergency Department (HOSPITAL_COMMUNITY): Payer: Self-pay

## 2020-07-12 ENCOUNTER — Other Ambulatory Visit: Payer: Self-pay

## 2020-07-12 ENCOUNTER — Encounter (HOSPITAL_COMMUNITY): Payer: Self-pay | Admitting: *Deleted

## 2020-07-12 ENCOUNTER — Emergency Department (HOSPITAL_COMMUNITY)
Admission: EM | Admit: 2020-07-12 | Discharge: 2020-07-13 | Disposition: A | Discharge status: Home / Self Care | Payer: Self-pay | Attending: Emergency Medicine | Admitting: Emergency Medicine

## 2020-07-12 DIAGNOSIS — J4541 Moderate persistent asthma with (acute) exacerbation: Secondary | ICD-10-CM | POA: Insufficient documentation

## 2020-07-12 DIAGNOSIS — Z20822 Contact with and (suspected) exposure to covid-19: Secondary | ICD-10-CM | POA: Insufficient documentation

## 2020-07-12 DIAGNOSIS — Z7982 Long term (current) use of aspirin: Secondary | ICD-10-CM | POA: Insufficient documentation

## 2020-07-12 DIAGNOSIS — J181 Lobar pneumonia, unspecified organism: Secondary | ICD-10-CM | POA: Insufficient documentation

## 2020-07-12 DIAGNOSIS — F17290 Nicotine dependence, other tobacco product, uncomplicated: Secondary | ICD-10-CM | POA: Insufficient documentation

## 2020-07-12 DIAGNOSIS — J189 Pneumonia, unspecified organism: Secondary | ICD-10-CM

## 2020-07-12 MED ORDER — DOXYCYCLINE HYCLATE 100 MG PO TABS
100.0000 mg | ORAL_TABLET | Freq: Once | ORAL | Status: AC
  Administered 2020-07-13: 100 mg via ORAL
  Filled 2020-07-12: qty 1

## 2020-07-12 MED ORDER — PREDNISONE 20 MG PO TABS
60.0000 mg | ORAL_TABLET | Freq: Once | ORAL | Status: AC
  Administered 2020-07-13: 60 mg via ORAL
  Filled 2020-07-12: qty 3

## 2020-07-12 MED ORDER — ALBUTEROL SULFATE HFA 108 (90 BASE) MCG/ACT IN AERS
2.0000 | INHALATION_SPRAY | Freq: Once | RESPIRATORY_TRACT | Status: AC
  Administered 2020-07-12: 2 via RESPIRATORY_TRACT
  Filled 2020-07-12: qty 6.7

## 2020-07-12 MED ORDER — ALBUTEROL SULFATE HFA 108 (90 BASE) MCG/ACT IN AERS
8.0000 | INHALATION_SPRAY | Freq: Once | RESPIRATORY_TRACT | Status: AC
  Administered 2020-07-13: 8 via RESPIRATORY_TRACT
  Filled 2020-07-12: qty 6.7

## 2020-07-12 NOTE — ED Triage Notes (Signed)
Pt reporting cough, tightness in her chest when she coughs, and some sob. Hx of asthma. Seen yesterday, covid negative.

## 2020-07-13 ENCOUNTER — Other Ambulatory Visit (HOSPITAL_COMMUNITY): Payer: Self-pay | Admitting: Emergency Medicine

## 2020-07-13 LAB — RESPIRATORY PANEL BY RT PCR (FLU A&B, COVID)
Influenza A by PCR: NEGATIVE
Influenza B by PCR: NEGATIVE
SARS Coronavirus 2 by RT PCR: NEGATIVE

## 2020-07-13 MED ORDER — PREDNISONE 50 MG PO TABS: ORAL_TABLET | ORAL | 0 refills | Status: DC

## 2020-07-13 MED ORDER — DOXYCYCLINE HYCLATE 100 MG PO CAPS: 100.0000 mg | ORAL_CAPSULE | Freq: Two times a day (BID) | ORAL | 0 refills | Status: DC

## 2020-07-13 MED ORDER — ALBUTEROL SULFATE (2.5 MG/3ML) 0.083% IN NEBU
5.0000 mg | INHALATION_SOLUTION | Freq: Once | RESPIRATORY_TRACT | Status: AC
  Administered 2020-07-13: 5 mg via RESPIRATORY_TRACT
  Filled 2020-07-13: qty 6

## 2020-07-13 MED ORDER — IPRATROPIUM BROMIDE 0.02 % IN SOLN
0.5000 mg | Freq: Once | RESPIRATORY_TRACT | Status: AC
  Administered 2020-07-13: 0.5 mg via RESPIRATORY_TRACT
  Filled 2020-07-13: qty 2.5

## 2020-07-13 MED FILL — DOXYCYCLINE HYCLATE 100 MG: 100 | 7 days supply | Qty: 14 | Fill #0

## 2020-07-13 MED FILL — predniSONE 50 MG TABS: 50 | 4 days supply | Qty: 4 | Fill #0

## 2020-07-13 NOTE — ED Notes (Signed)
Pt ambulated to bathroom w/o assistance, o2 was 98-99% after ambulation.

## 2020-07-13 NOTE — Discharge Instructions (Signed)
Please get the COVID-19 vaccine

## 2020-07-13 NOTE — ED Provider Notes (Signed)
[facility] EMERGENCY DEPARTMENT Provider Note   CSN: [id number] Arrival date & time: 07/12/20  2048     History Chief Complaint  Patient presents with  . Cough    Faith Pope is a 29 y.o. female.  The history is provided by the patient.  Cough Severity:  Moderate Onset quality:  Gradual Duration:  3 days Timing:  Intermittent Progression:  Improving Chronicity:  New Smoker: yes   Relieved by:  Nothing Worsened by:  Nothing Associated symptoms: chest pain, shortness of breath and wheezing   Associated symptoms: no fever   Patient with history of asthma, Hodgkin lymphoma that is in remission presents with cough and wheezing for 3 days.  She reports the past 2 days she has had wheezing, shortness of breath and chest tightness.  No fevers.  She had a negative Covid test about 2 days ago at work. She is a smoker.  She is unvaccinated. She denies hemoptysis     Past Medical History:  Diagnosis Date  . Asthma   . Cancer ([place])    Hodgkin Lymphoma diagnosed 04/2019  . History of radiation therapy 09/08/19- 09/22/19   Right neck/ chest 11 fractions of 2 Gy each to total 22 Gy.     Patient Active Problem List   Diagnosis Date Noted  . Port-A-Cath in place 08/04/2019  . Goals of care, counseling/discussion 06/16/2019  . Encounter for antineoplastic chemotherapy 06/16/2019  . Hodgkin's lymphoma ([place]) 06/02/2019  . Cigar smoker 06/02/2019    Past Surgical History:  Procedure Laterality Date  . IR BONE MARROW BIOPSY & ASPIRATION  06/21/2019  . IR IMAGING GUIDED PORT INSERTION  06/21/2019  . MASS BIOPSY Right 05/11/2019   Procedure: EXCISIONAL BIOPSY OF RIGHT CERVICAL LYMPH NODE;  Surgeon: Melida Quitter, MD;  Location: [facility];  Service: ENT;  Laterality: Right;  . TONSILLECTOMY       OB History   No obstetric history on file.     No family history on file.  Social History   Tobacco Use  . Smoking status: Current Every Day Smoker    Packs/day:  1.00    Types: Cigars  . Smokeless tobacco: Never Used  . Tobacco comment: 1 cigar daily  Vaping Use  . Vaping Use: Never used  Substance Use Topics  . Alcohol use: Yes    Comment: rare on special occasions  . Drug use: No    Home Medications Prior to Admission medications   Medication Sig Start Date End Date Taking? Authorizing Provider  albuterol (VENTOLIN HFA) 108 (90 Base) MCG/ACT inhaler Inhale 1-2 puffs into the lungs every 6 (six) hours as needed for wheezing or shortness of breath.    [provider]  allopurinol (ZYLOPRIM) 100 MG tablet Take 1 tablet (100 mg total) by mouth daily. Patient not taking: Reported on 08/30/2019 06/23/19   Heilingoetter, Cassandra L, PA-C  aspirin-acetaminophen-caffeine (EXCEDRIN MIGRAINE) 250-250-65 MG tablet Take 2 tablets by mouth every 6 (six) hours as needed for headache.    [provider]  benzonatate (TESSALON) 100 MG capsule Take 1-2 capsules (100-200 mg total) by mouth 3 (three) times daily as needed for cough. 10/08/19   Eppie Gibson, MD  HYDROcodone-acetaminophen (HYCET) 7.5-325 mg/15 ml solution Take 10-15 mL Q4hours PRN severe pain; take with food. Take stool softener (Colace OTC or prune juice) twice daily if this causes constipation. 09/20/19   Eppie Gibson, MD  lidocaine (XYLOCAINE) 2 % solution Patient: Mix 1part 2% viscous lidocaine, 1part H20.  Swallow 16m of diluted mixture, 334m before meals and at bedtime, up to QID 09/20/19   SqEppie GibsonMD  lidocaine-prilocaine (EMLA) cream Apply 1 application topically as needed. 06/22/20   MoCurt BearsMD  LORazepam (ATIVAN) 0.5 MG tablet Take 1 tablet, 30 min before radiotherapy, PRN anxiety. Do not drive after taking this medication. 09/08/19   SqEppie GibsonMD  omeprazole (PRILOSEC) 40 MG capsule Take 1 capsule (40 mg total) by mouth daily. Patient not taking: Reported on 08/30/2019 07/28/19   TaHarle Stanford PA-C  prochlorperazine (COMPAZINE) 10 MG tablet Take 1 tablet  (10 mg total) by mouth every 6 (six) hours as needed for nausea or vomiting. Patient not taking: Reported on 08/30/2019 06/23/19   Heilingoetter, Cassandra L, PA-C  sucralfate (CARAFATE) 1 g tablet Dissolve 1 tablet in 10 mL H20 and swallow 30 min prior to meals and bedtime. 09/13/19   SqEppie GibsonMD    Allergies    Other, Shellfish allergy, Amoxicillin, and Penicillins  Review of Systems   Review of Systems  Constitutional: Negative for fever.  Respiratory: Positive for cough, shortness of breath and wheezing.   Cardiovascular: Positive for chest pain.  All other systems reviewed and are negative.   Physical Exam Updated Vital Signs BP (!) 140/103   Pulse 84   Temp 99.1 F (37.3 C) (Oral)   Resp (!) 24   LMP 06/20/2020   SpO2 99%   Physical Exam  CONSTITUTIONAL: Well developed/well nourished HEAD: Normocephalic/atraumatic EYES: EOMI/PERRL NECK: supple no meningeal signs SPINE/BACK:entire spine nontender CV: S1/S2 noted, no murmurs/rubs/gallops noted LUNGS: Wheezing bilaterally, no acute distress ABDOMEN: soft, nontender, no rebound or guarding, bowel sounds noted throughout abdomen GU:no cva tenderness NEURO: Pt is awake/alert/appropriate, moves all extremitiesx4.  No facial droop.   EXTREMITIES: pulses normal/equal, full ROM, no lower extremity edema SKIN: warm, color normal PSYCH: no abnormalities of mood noted, alert and oriented to situation  ED Results / Procedures / Treatments   Labs (all labs ordered are listed, but only abnormal results are displayed) Labs Reviewed  RESPIRATORY PANEL BY RT PCR (FLU A&B, COVID)    EKG None  Radiology DG Chest 2 View  Result Date: 07/12/2020 CLINICAL DATA:  Cough, shortness of breath EXAM: CHEST - 2 VIEW COMPARISON:  12/20/2018 FINDINGS: Right Port-A-Cath in place with the tip at the cavoatrial junction. Heart is normal size. Right lung clear. Indistinctness of the left heart border concerning for lingular infiltrate. No  effusions or acute bony abnormality. IMPRESSION: Concern for lingular infiltrate/pneumonia. Electronically Signed   By: KeRolm Baptise.D.   On: 07/12/2020 21:29    Procedures Procedures   Medications Ordered in ED Medications  albuterol (VENTOLIN HFA) 108 (90 Base) MCG/ACT inhaler 2 puff (2 puffs Inhalation Given 07/12/20 2223)  predniSONE (DELTASONE) tablet 60 mg (60 mg Oral Given 07/13/20 0021)  albuterol (VENTOLIN HFA) 108 (90 Base) MCG/ACT inhaler 8 puff (8 puffs Inhalation Given 07/13/20 0021)  doxycycline (VIBRA-TABS) tablet 100 mg (100 mg Oral Given 07/13/20 0021)  albuterol (PROVENTIL) (2.5 MG/3ML) 0.083% nebulizer solution 5 mg (5 mg Nebulization Given 07/13/20 0254)  ipratropium (ATROVENT) nebulizer solution 0.5 mg (0.5 mg Nebulization Given 07/13/20 0321)    ED Course  I have reviewed the triage vital signs and the nursing notes.  Pertinent labs & imaging results that were available during my care of the patient were reviewed by me and considered in my medical decision making (see chart for details).    MDM Rules/Calculators/A&P  Patient with history of Hodgkin's lymphoma that is in remission, also history of asthma presents with cough and wheezing. She has had symptoms for 3 days. Patient was given multiple albuterol treatments and prednisone and is now She is Much improved. She still has residual wheeze, but no distress noted. No hypoxia. She ambulates without difficulty I feel she is appropriate for discharge home for outpatient management of pneumonia. Limitations on antibiotics due to allergy, will start on doxycycline. She was counseled on smoking cessation. Also counseled her on taking the COVID-19 vaccine   Faith Pope was evaluated in Emergency Department on 07/13/2020 for the symptoms described in the history of present illness. She was evaluated in the context of the global COVID-19 pandemic, which necessitated consideration that the patient  might be at risk for infection with the SARS-CoV-2 virus that causes COVID-19. Institutional protocols and algorithms that pertain to the evaluation of patients at risk for COVID-19 are in a state of rapid change based on information released by regulatory bodies including the CDC and federal and state organizations. These policies and algorithms were followed during the patient's care in the ED.  Final Clinical Impression(s) / ED Diagnoses Final diagnoses:  Moderate persistent asthma with exacerbation  Community acquired pneumonia of left lower lobe of lung    Rx / DC Orders ED Discharge Orders         Ordered    doxycycline (VIBRAMYCIN) 100 MG capsule  2 times daily        07/13/20 0343    predniSONE (DELTASONE) 50 MG tablet        07/13/20 0343           Ripley Fraise, MD 07/13/20 [phone]

## 2020-08-22 ENCOUNTER — Inpatient Hospital Stay: Payer: Self-pay

## 2020-08-23 ENCOUNTER — Inpatient Hospital Stay: Payer: Self-pay | Attending: Radiation Oncology

## 2020-08-23 ENCOUNTER — Other Ambulatory Visit: Payer: Self-pay

## 2020-08-23 DIAGNOSIS — C8110 Nodular sclerosis classical Hodgkin lymphoma, unspecified site: Secondary | ICD-10-CM | POA: Insufficient documentation

## 2020-08-23 DIAGNOSIS — Z95828 Presence of other vascular implants and grafts: Secondary | ICD-10-CM

## 2020-08-23 DIAGNOSIS — Z452 Encounter for adjustment and management of vascular access device: Secondary | ICD-10-CM | POA: Insufficient documentation

## 2020-08-23 MED ORDER — HEPARIN SOD (PORK) LOCK FLUSH 100 UNIT/ML IV SOLN
500.0000 [IU] | Freq: Once | INTRAVENOUS | Status: AC | PRN
  Administered 2020-08-23: 16:00:00 500 [IU]
  Filled 2020-08-23: qty 5

## 2020-08-23 MED ORDER — SODIUM CHLORIDE 0.9% FLUSH
10.0000 mL | INTRAVENOUS | Status: DC | PRN
  Administered 2020-08-23: 16:00:00 10 mL
  Filled 2020-08-23: qty 10

## 2020-08-23 NOTE — Patient Instructions (Signed)

## 2020-08-28 ENCOUNTER — Other Ambulatory Visit: Payer: Self-pay

## 2020-08-28 ENCOUNTER — Emergency Department (HOSPITAL_COMMUNITY): Payer: Medicaid Other

## 2020-08-28 ENCOUNTER — Encounter (HOSPITAL_COMMUNITY): Payer: Self-pay

## 2020-08-28 ENCOUNTER — Emergency Department (HOSPITAL_COMMUNITY)
Admission: EM | Admit: 2020-08-28 | Discharge: 2020-08-29 | Disposition: A | Discharge status: Home / Self Care | Payer: Medicaid Other | Attending: Emergency Medicine | Admitting: Emergency Medicine

## 2020-08-28 DIAGNOSIS — J189 Pneumonia, unspecified organism: Secondary | ICD-10-CM | POA: Insufficient documentation

## 2020-08-28 DIAGNOSIS — Z859 Personal history of malignant neoplasm, unspecified: Secondary | ICD-10-CM | POA: Insufficient documentation

## 2020-08-28 DIAGNOSIS — F17290 Nicotine dependence, other tobacco product, uncomplicated: Secondary | ICD-10-CM | POA: Insufficient documentation

## 2020-08-28 DIAGNOSIS — Z20822 Contact with and (suspected) exposure to covid-19: Secondary | ICD-10-CM | POA: Insufficient documentation

## 2020-08-28 DIAGNOSIS — J4521 Mild intermittent asthma with (acute) exacerbation: Secondary | ICD-10-CM | POA: Insufficient documentation

## 2020-08-28 LAB — RESP PANEL BY RT-PCR (FLU A&B, COVID) ARPGX2
Influenza A by PCR: NEGATIVE
Influenza B by PCR: NEGATIVE
SARS Coronavirus 2 by RT PCR: NEGATIVE

## 2020-08-28 NOTE — ED Triage Notes (Signed)
Pt presents POV with nasal congestion, SOB, non productive cough and CP due to her coughing x2 days. Pt reports she was dx with PNA in November and feels she has it again.  Pt took tylenol at 0700  Pt has not been vaccinated. Pt denies any recent known covid exposure

## 2020-08-28 NOTE — ED Notes (Signed)
Pt refusing blood work. Pt also requesting to sit in her car. [phone]

## 2020-08-28 NOTE — ED Notes (Signed)
Unable to obtain VS as patient is sitting in their vehicle.

## 2020-08-29 ENCOUNTER — Other Ambulatory Visit (HOSPITAL_COMMUNITY): Payer: Self-pay | Admitting: Emergency Medicine

## 2020-08-29 ENCOUNTER — Telehealth: Payer: Self-pay | Admitting: Medical Oncology

## 2020-08-29 LAB — BASIC METABOLIC PANEL
Anion gap: 10 (ref 5–15)
BUN: 12 mg/dL (ref 6–20)
CO2: 19 mmol/L — ABNORMAL LOW (ref 22–32)
Calcium: 9.4 mg/dL (ref 8.9–10.3)
Chloride: 107 mmol/L (ref 98–111)
Creatinine, Ser: 0.63 mg/dL (ref 0.44–1.00)
GFR, Estimated: >60 mL/min (ref >60)
Glucose, Bld: 102 mg/dL — ABNORMAL HIGH (ref 70–99)
Potassium: 4.3 mmol/L (ref 3.5–5.1)
Sodium: 136 mmol/L (ref 135–145)

## 2020-08-29 LAB — CBC
HCT: 38 % (ref 36.0–46.0)
Hemoglobin: 12.8 g/dL (ref 12.0–15.0)
MCH: 30 pg (ref 26.0–34.0)
MCHC: 33.7 g/dL (ref 30.0–36.0)
MCV: 89 fL (ref 80.0–100.0)
Platelets: 261 10*3/uL (ref 150–400)
RBC: 4.27 MIL/uL (ref 3.87–5.11)
RDW: 13.8 % (ref 11.5–15.5)
WBC: 7.6 10*3/uL (ref 4.0–10.5)
nRBC: 0 % (ref 0.0–0.2)

## 2020-08-29 LAB — D-DIMER, QUANTITATIVE: D-Dimer, Quant: 0.34 ug/mL-FEU (ref 0.00–0.50)

## 2020-08-29 LAB — TROPONIN I (HIGH SENSITIVITY): Troponin I (High Sensitivity): 2 ng/L (ref <18)

## 2020-08-29 MED ORDER — DOXYCYCLINE HYCLATE 100 MG PO TABS
100.0000 mg | ORAL_TABLET | Freq: Once | ORAL | Status: AC
  Administered 2020-08-29: 100 mg via ORAL
  Filled 2020-08-29: qty 1

## 2020-08-29 MED ORDER — ALBUTEROL SULFATE (2.5 MG/3ML) 0.083% IN NEBU: 2.5000 mg | INHALATION_SOLUTION | Freq: Four times a day (QID) | RESPIRATORY_TRACT | 12 refills | Status: DC | PRN

## 2020-08-29 MED ORDER — IPRATROPIUM-ALBUTEROL 0.5-2.5 (3) MG/3ML IN SOLN
3.0000 mL | Freq: Once | RESPIRATORY_TRACT | Status: AC
  Administered 2020-08-29: 3 mL via RESPIRATORY_TRACT
  Filled 2020-08-29: qty 3

## 2020-08-29 MED ORDER — DEXAMETHASONE 4 MG PO TABS
10.0000 mg | ORAL_TABLET | Freq: Once | ORAL | Status: AC
  Administered 2020-08-29: 10 mg via ORAL
  Filled 2020-08-29: qty 3

## 2020-08-29 MED ORDER — DEXAMETHASONE 10 MG/ML FOR PEDIATRIC ORAL USE: 10.0000 mg | Freq: Once | INTRAMUSCULAR | Status: DC

## 2020-08-29 MED ORDER — DOXYCYCLINE HYCLATE 100 MG PO CAPS: 100.0000 mg | ORAL_CAPSULE | Freq: Two times a day (BID) | ORAL | 0 refills | Status: DC

## 2020-08-29 MED FILL — DOXYCYCLINE HYCLATE 100 MG: 100 | 7 days supply | Qty: 14 | Fill #0

## 2020-08-29 NOTE — Telephone Encounter (Signed)
Pt stated she cannot afford the antibiotics and inhalers ( $1200) prescribed by ED provider for pneumonia. I instructed her to go back to ED and tell the provider to prescribe a less expensive medication.

## 2020-08-29 NOTE — Telephone Encounter (Signed)
Pt called back and said the medication is $9 and she can afford that. She misunderstood cost.

## 2020-08-29 NOTE — Discharge Instructions (Signed)

## 2020-08-29 NOTE — ED Provider Notes (Signed)
Emergency Department Provider Note   I have reviewed the triage vital signs and the nursing notes.   HISTORY  Chief Complaint Shortness of Breath, Cough, and Chest Pain   HPI Faith Pope is a 30 y.o. female with a past medical history of Hodgkin lymphoma and asthma presents with shortness of breath with productive cough and some central chest discomfort.  Pain is worse with coughing and nonradiating.  She was diagnosed last month with pneumonia and states this feels very similar.  She has not had fevers.  She feels shortness of breath especially with walking.  She states symptoms began while she was in a dusty room and notes that she continues to smoke although is trying to cut back.  She wonders if the dust and smoking could have triggered some of this.  She has been trying her albuterol at home and finds relief only with using her nebulizers.  She has not appreciated much wheezing on her own.  No vomiting or diarrhea symptoms.  No sick contacts.   Past Medical History:  Diagnosis Date  . Asthma   . Cancer ([place])    Hodgkin Lymphoma diagnosed 04/2019  . History of radiation therapy 09/08/19- 09/22/19   Right neck/ chest 11 fractions of 2 Gy each to total 22 Gy.     Patient Active Problem List   Diagnosis Date Noted  . Port-A-Cath in place 08/04/2019  . Goals of care, counseling/discussion 06/16/2019  . Encounter for antineoplastic chemotherapy 06/16/2019  . Hodgkin's lymphoma ([place]) 06/02/2019  . Cigar smoker 06/02/2019    Past Surgical History:  Procedure Laterality Date  . IR BONE MARROW BIOPSY & ASPIRATION  06/21/2019  . IR IMAGING GUIDED PORT INSERTION  06/21/2019  . MASS BIOPSY Right 05/11/2019   Procedure: EXCISIONAL BIOPSY OF RIGHT CERVICAL LYMPH NODE;  Surgeon: Melida Quitter, MD;  Location: [facility];  Service: ENT;  Laterality: Right;  . TONSILLECTOMY      Allergies Other, Shellfish allergy, Amoxicillin, and Penicillins  History reviewed. No pertinent family  history.  Social History Social History   Tobacco Use  . Smoking status: Current Every Day Smoker    Packs/day: 1.00    Types: Cigars  . Smokeless tobacco: Never Used  . Tobacco comment: 1 cigar daily  Vaping Use  . Vaping Use: Never used  Substance Use Topics  . Alcohol use: Yes    Comment: rare on special occasions  . Drug use: No    Review of Systems  Constitutional: No fever/chills Eyes: No visual changes. ENT: No sore throat. Cardiovascular: Positive chest pain. Respiratory: Positive shortness of breath and cough.  Gastrointestinal: No abdominal pain.  No nausea, no vomiting.  No diarrhea.  No constipation. Genitourinary: Negative for dysuria. Musculoskeletal: Negative for back pain. Skin: Negative for rash. Neurological: Negative for headaches, focal weakness or numbness.  10-point ROS otherwise negative.  ____________________________________________   PHYSICAL EXAM:  VITAL SIGNS: ED Triage Vitals [08/28/20 1904]  Enc Vitals Group     BP (!) 143/91     Pulse Rate (!) 133     Resp (!) 24     Temp 99.6 F (37.6 C)     Temp Source Oral     SpO2 97 %   Constitutional: Alert and oriented. Well appearing and in no acute distress. Eyes: Conjunctivae are normal.  Head: Atraumatic. Nose: No congestion/rhinnorhea. Mouth/Throat: Mucous membranes are moist. Neck: No stridor.  Cardiovascular: Tachycardia. Good peripheral circulation. Grossly normal heart sounds.   Respiratory: Normal  respiratory effort.  No retractions. Lungs with mild bilateral end-expiratory wheezing.  Gastrointestinal: No distention.  Musculoskeletal: No lower extremity tenderness nor edema. Neurologic:  Normal speech and language. Skin:  Skin is warm, dry and intact. No rash noted.  ____________________________________________   LABS (all labs ordered are listed, but only abnormal results are displayed)  Labs Reviewed  BASIC METABOLIC PANEL - Abnormal; Notable for the following  components:      Result Value   CO2 19 (*)    Glucose, Bld 102 (*)    All other components within normal limits  RESP PANEL BY RT-PCR (FLU A&B, COVID) ARPGX2  CBC  D-DIMER, QUANTITATIVE (NOT AT [facility])  I-STAT BETA HCG BLOOD, ED ([facility], [facility], [facility] ONLY)  TROPONIN I (HIGH SENSITIVITY)  TROPONIN I (HIGH SENSITIVITY)   ____________________________________________  EKG  Not performed  ____________________________________________  RADIOLOGY  DG Chest 2 View  Result Date: 08/28/2020 CLINICAL DATA:  Shortness of breath. EXAM: CHEST - 2 VIEW COMPARISON:  July 12, 2020 FINDINGS: The right-sided Port-A-Cath is well position. The previously demonstrated opacity in the left lower lung zone has improved. There is suggestion of a developing airspace opacity in the right lower lung zone, more conspicuous on today's exam. There is a relatively stable right infrahilar airspace opacity, stable from the patient's prior CT. There is no pneumothorax. No large pleural effusion. IMPRESSION: 1. Possible developing infiltrate in the right lower lung zone. 2. Stable right infrahilar airspace opacity. Resolution of the previously demonstrated airspace opacity in the left lower lung zone. 3. Well-positioned right-sided Port-A-Cath. Electronically Signed   By: Constance Holster M.D.   On: 08/28/2020 20:52    ____________________________________________   PROCEDURES  Procedure(s) performed:   Procedures  None  ____________________________________________   INITIAL IMPRESSION / ASSESSMENT AND PLAN / ED COURSE  Pertinent labs & imaging results that were available during my care of the patient were reviewed by me and considered in my medical decision making (see chart for details).   Patient presents to the emergency department with chest pain with productive cough and shortness of breath symptoms.  Her x-ray today shows possible developing infiltrate in the right lower lung.  The prior airspace opacity has  resolved in comparison per radiology.  Patient arrives with tachycardia but is afebrile and has normal oxygen saturation on room air.  She is not having significant increased work of breathing.  I do appreciate some end expiratory wheezing on my exam and will give a DuoNeb.  She is negative for Covid and flu.  Labs are pending and I have added a D-dimer with her increased risk of PE.   On reassessment the patient is feeling improved after albuterol.  Likely a reactive airway component to underlying pneumonia.  Gave Decadron here and will send home with doxycycline.  Patient had this medication last time with full recovery.  No hypoxemia here to prompt admission.  D-dimer and labs including troponin are normal. COVID negative.   At this time, I do not feel there is any life-threatening condition present. I have reviewed and discussed all results (EKG, imaging, lab, urine as appropriate), exam findings with patient. I have reviewed nursing notes and appropriate previous records.  I feel the patient is safe to be discharged home without further emergent workup. Discussed usual and customary return precautions. Patient and family (if present) verbalize understanding and are comfortable with this plan.  Patient will follow-up with their primary care provider. If they do not have a primary care provider,  information for follow-up has been provided to them. All questions have been answered.  ____________________________________________  FINAL CLINICAL IMPRESSION(S) / ED DIAGNOSES  Final diagnoses:  Community acquired pneumonia of right lower lobe of lung  Mild intermittent asthma with exacerbation     MEDICATIONS GIVEN DURING THIS VISIT:  Medications  ipratropium-albuterol (DUONEB) 0.5-2.5 (3) MG/3ML nebulizer solution 3 mL (3 mLs Nebulization Given 08/29/20 0829)  doxycycline (VIBRA-TABS) tablet 100 mg (100 mg Oral Given 08/29/20 1104)  dexamethasone (DECADRON) tablet 10 mg (10 mg Oral Given 08/29/20 1104)      NEW OUTPATIENT MEDICATIONS STARTED DURING THIS VISIT:  Discharge Medication List as of 08/29/2020 10:33 AM    START taking these medications   Details  albuterol (PROVENTIL) (2.5 MG/3ML) 0.083% nebulizer solution Take 3 mLs (2.5 mg total) by nebulization every 6 (six) hours as needed for wheezing or shortness of breath., Starting Tue 08/29/2020, Normal        Note:  This document was prepared using Dragon voice recognition software and may include unintentional dictation errors.  Nanda Quinton, MD, [facility] Emergency Medicine    Takyla Kuchera, [facility], MD 08/29/20 [phone]

## 2020-09-07 ENCOUNTER — Telehealth: Payer: Self-pay | Admitting: *Deleted

## 2020-09-07 NOTE — Telephone Encounter (Signed)
CALLED PATIENT TO REMIND OF LAB AND FU FOR 09-08-20, SPOKE WITH PATIENT AND SHE TELLS ME THAT SHE HAS COURT ON 09-08-20 AND SHE WANTS TO RESCHEDULE- APPT. RESCHEDULED FOR 09-19-20 - 2PM- LAB AND FU 09-19-20 @ 2:20 PM, PATIENT AGREED TO NEW DATE AND TIMES

## 2020-09-08 ENCOUNTER — Ambulatory Visit: Payer: Medicaid Other

## 2020-09-08 ENCOUNTER — Ambulatory Visit: Payer: Medicaid Other | Admitting: Radiation Oncology

## 2020-09-18 ENCOUNTER — Telehealth: Payer: Self-pay | Admitting: *Deleted

## 2020-09-18 NOTE — Telephone Encounter (Signed)
CALLED PATIENT TO REMIND OF LAB AND FU APPT. FOR 09-19-20, SPOKE WITH PATIENT AND SHE IS AWARE OF THESE APPTS.

## 2020-09-19 ENCOUNTER — Other Ambulatory Visit: Payer: Self-pay

## 2020-09-19 ENCOUNTER — Ambulatory Visit
Admission: RE | Admit: 2020-09-19 | Discharge: 2020-09-19 | Disposition: A | Discharge status: Home / Self Care | Payer: Self-pay | Source: Ambulatory Visit | Attending: Radiation Oncology | Admitting: Radiation Oncology

## 2020-09-19 VITALS — BP 135/95 | HR 100 | Temp 97.5°F | Resp 20 | Ht 62.0 in | Wt 192.2 lb

## 2020-09-19 DIAGNOSIS — Z79899 Other long term (current) drug therapy: Secondary | ICD-10-CM | POA: Insufficient documentation

## 2020-09-19 DIAGNOSIS — C8191 Hodgkin lymphoma, unspecified, lymph nodes of head, face, and neck: Secondary | ICD-10-CM

## 2020-09-19 DIAGNOSIS — Z1329 Encounter for screening for other suspected endocrine disorder: Secondary | ICD-10-CM

## 2020-09-19 DIAGNOSIS — C8112 Nodular sclerosis classical Hodgkin lymphoma, intrathoracic lymph nodes: Secondary | ICD-10-CM

## 2020-09-19 DIAGNOSIS — C8111 Nodular sclerosis classical Hodgkin lymphoma, lymph nodes of head, face, and neck: Secondary | ICD-10-CM

## 2020-09-19 DIAGNOSIS — Z923 Personal history of irradiation: Secondary | ICD-10-CM | POA: Insufficient documentation

## 2020-09-19 DIAGNOSIS — R599 Enlarged lymph nodes, unspecified: Secondary | ICD-10-CM | POA: Insufficient documentation

## 2020-09-19 DIAGNOSIS — C8118 Nodular sclerosis classical Hodgkin lymphoma, lymph nodes of multiple sites: Secondary | ICD-10-CM | POA: Insufficient documentation

## 2020-09-19 LAB — TSH: TSH: 3.209 u[IU]/mL (ref 0.308–3.960)

## 2020-09-19 NOTE — Progress Notes (Signed)
Ms. Matlin presents for follow up of radiation completed on 09/22/2019 to her right neck/ chest area  Pain issues, if any: Patient denies Weight changes, if any:  Wt Readings from Last 3 Encounters:  09/19/20 192 lb 3.2 oz (87.2 kg)  06/22/20 190 lb 12.8 oz (86.5 kg)  12/22/19 189 lb 4.8 oz (85.9 kg)   Swallowing issues, if any: Patient denies. Reports she can eat a wide variety of foods and beverages  Cough/SOB: Patient denies any issues with persistent coughing or shortness of breath. Recently recovered from pneumonia but denies any lingering side effects Smoking or chewing tobacco? Smokes cigars daily Last ENT visit was on: 06/01/2019 Saw Dr. Orpah Greek after her biopsy. Was told to follow up as needed Other notable issues, if any: Patient has not received COVID vaccine. Overall she reports she feels well and is pleased with her recovery thus far since completing radiation  Vitals:   09/19/20 1435  BP: (!) 135/95  Pulse: 100  Resp: 20  Temp: (!) 97.5 F (36.4 C)  SpO2: 100%

## 2020-09-20 ENCOUNTER — Encounter: Payer: Self-pay | Admitting: Radiation Oncology

## 2020-09-20 DIAGNOSIS — C8111 Nodular sclerosis classical Hodgkin lymphoma, lymph nodes of head, face, and neck: Secondary | ICD-10-CM | POA: Insufficient documentation

## 2020-09-20 NOTE — Progress Notes (Signed)
Radiation Oncology         [phone] ________________________________  Name: Faith Pope MRN: [id number]  Date: 09/19/2020  DOB: 01-16-1991  Follow-Up Visit Note  Outpatient  CC: Pcp, No  No ref. provider found  Diagnosis and Prior Radiotherapy:    ICD-10-CM   1. 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 Faith Pope presents for follow up of radiation completed on 09/22/2019 to her right neck/ chest area  Pain issues, if any: Patient denies Weight changes, if any:  Wt Readings from Last 3 Encounters:  09/19/20 192 lb 3.2 oz (87.2 kg)  06/22/20 190 lb 12.8 oz (86.5 kg)  12/22/19 189 lb 4.8 oz (85.9 kg)   Swallowing issues, if any: Patient denies. Reports she can eat a wide variety of foods and beverages  Cough/SOB: Patient denies any issues with persistent coughing or shortness of breath. Recently recovered from pneumonia but denies any lingering side effects Smoking or chewing tobacco? Smokes cigars daily Last ENT visit was on: 06/01/2019 Saw Dr. Orpah Greek after her biopsy. Was told to follow up as needed Other notable issues, if any: Patient has not received COVID vaccine. Overall she reports she feels well and is pleased with her recovery thus far since completing radiation.     She reports that she has never had a breast exam before.  She is here with her partner today.  Vitals:   09/19/20 1435  BP: (!) 135/95  Pulse: 100  Resp: 20  Temp: (!) 97.5 F (36.4 C)  SpO2: 100%                       ALLERGIES:  is allergic to other, shellfish allergy, amoxicillin, and  penicillins.  Meds: Current Outpatient Medications  Medication Sig Dispense Refill  . albuterol (PROVENTIL) (2.5 MG/3ML) 0.083% nebulizer solution Take 3 mLs (2.5 mg total) by nebulization every 6 (six) hours as needed for wheezing or shortness of breath. 75 mL 12  . albuterol (VENTOLIN HFA) 108 (90 Base) MCG/ACT inhaler Inhale 1-2 puffs into the lungs every 6 (six) hours as needed for wheezing or shortness of breath.    . allopurinol (ZYLOPRIM) 100 MG tablet Take 1 tablet (100 mg total) by mouth daily. 60 tablet 2  . aspirin-acetaminophen-caffeine (EXCEDRIN MIGRAINE) 250-250-65 MG tablet Take 2 tablets by mouth every 6 (six) hours as needed for headache.    . lidocaine-prilocaine (EMLA) cream Apply 1 application topically as needed. 30 g 0  . lidocaine (XYLOCAINE) 2 % solution Patient: Mix 1part 2% viscous lidocaine, 1part H20. Swallow 70mL of diluted mixture, 11min before meals and at bedtime, up to QID (Patient not taking: Reported on 09/19/2020) 200 mL 3  . LORazepam (ATIVAN) 0.5 MG tablet Take 1 tablet, 30 min before radiotherapy, PRN anxiety. Do not drive after taking this medication. (Patient not taking: Reported on 09/19/2020) 10 tablet 0  . predniSONE (DELTASONE) 50 MG tablet One tablet PO daily for 4 days (Patient not taking: Reported on 09/19/2020) 4 tablet 0  . sucralfate (CARAFATE) 1 g tablet Dissolve 1 tablet  in 10 mL H20 and swallow 30 min prior to meals and bedtime. (Patient not taking: Reported on 09/19/2020) 40 tablet 2   No current facility-administered medications for this encounter.    Physical Findings: The patient is in no acute distress. Patient is alert and oriented.  height is 5\' 2"  (1.575 m) and weight is 192 lb 3.2 oz (87.2 kg). Her temperature is 97.5 F (36.4 C) (abnormal). Her blood pressure is 135/95 (abnormal) and her pulse is 100. Her respiration is 20 and oxygen saturation is 100%. Marland Kitchen    NECK: No palpable adenopathy in the cervical or supraclavicular neck Heart:  Regular in rate and rhythm Chest clear to auscultation bilaterally Breasts: No palpable masses in the breasts or axillary regions bilaterally Skin: Satisfactory healing of skin in the radiation treatment fields    Lab Findings: Lab Results  Component Value Date   WBC 7.6 08/29/2020   HGB 12.8 08/29/2020   HCT 38.0 08/29/2020   MCV 89.0 08/29/2020   PLT 261 08/29/2020    Radiographic Findings: DG Chest 2 View  Result Date: 08/28/2020 CLINICAL DATA:  Shortness of breath. EXAM: CHEST - 2 VIEW COMPARISON:  July 12, 2020 FINDINGS: The right-sided Port-A-Cath is well position. The previously demonstrated opacity in the left lower lung zone has improved. There is suggestion of a developing airspace opacity in the right lower lung zone, more conspicuous on today's exam. There is a relatively stable right infrahilar airspace opacity, stable from the patient's prior CT. There is no pneumothorax. No large pleural effusion. IMPRESSION: 1. Possible developing infiltrate in the right lower lung zone. 2. Stable right infrahilar airspace opacity. Resolution of the previously demonstrated airspace opacity in the left lower lung zone. 3. Well-positioned right-sided Port-A-Cath. Electronically Signed   By: Constance Holster M.D.   On: 08/28/2020 20:52    Impression/Plan: No evidence of disease on physical exam.  She has been followed with posttreatment CT imaging in medical oncology.  She does have some nonspecific enlarged adenopathy above the diaphragm that is being monitored by Dr. Earlie Server.  She has another CT of her chest scheduled for April.  TSH was obtained today.  It is within normal limits. Lab Results  Component Value Date   TSH 3.209 09/19/2020   I will see her back in a year with TSH testing and physical exam.  She will continue to follow closely with medical oncology.  As discussed previously, she will need to start surveillance for breast cancer sooner than most women given her  radiation history (ie start in her mid 26s).  However this can be postponed for several years. Fortunately she was treated to a relatively low dose of radiation and most of her breast tissue is outside of the fields (with exception to portions of the medial breasts).  We discussed measures to reduce the risk of infection during the COVID-19 pandemic.  She has not been vaccinated yet.  I highly recommended that she get vaccinated to reduce the risk of death / morbidity /hospitalization from [place].  At this time she declines vaccination.  On date of service, in total, I spent 25 minutes on this encounter; she was seen in person _____________________________________   Eppie Gibson, MD

## 2020-09-21 ENCOUNTER — Telehealth: Payer: Self-pay

## 2020-09-21 NOTE — Telephone Encounter (Signed)
Called patient to inform her that per Dr. Isidore Moos, thyroid function is normal. Patient verbalized understanding and appreciation of call. No other needs identified at this time

## 2020-09-21 NOTE — Telephone Encounter (Signed)
-----   Message from Eppie Gibson, MD sent at 09/19/2020  5:05 PM EST ----- Please let her know thyroid fn looks good. Thanks! ----- Message ----- From: Buel Ream, Lab [facility] [place] Sent: 09/19/2020   3:34 PM EST To: Eppie Gibson, MD

## 2020-10-20 MED FILL — LIDOCAINE-PRILOCAINE CREAM: 2.5-2.5 | 30 days supply | Qty: 30 | Fill #0

## 2020-10-23 ENCOUNTER — Other Ambulatory Visit: Payer: Self-pay

## 2020-10-23 ENCOUNTER — Inpatient Hospital Stay: Payer: Medicaid Other | Attending: Radiation Oncology

## 2020-10-23 DIAGNOSIS — Z452 Encounter for adjustment and management of vascular access device: Secondary | ICD-10-CM | POA: Insufficient documentation

## 2020-10-23 DIAGNOSIS — Z95828 Presence of other vascular implants and grafts: Secondary | ICD-10-CM

## 2020-10-23 DIAGNOSIS — C8112 Nodular sclerosis classical Hodgkin lymphoma, intrathoracic lymph nodes: Secondary | ICD-10-CM | POA: Insufficient documentation

## 2020-10-23 MED ORDER — SODIUM CHLORIDE 0.9% FLUSH
10.0000 mL | INTRAVENOUS | Status: DC | PRN
  Administered 2020-10-23: 10 mL
  Filled 2020-10-23: qty 10

## 2020-10-23 MED ORDER — HEPARIN SOD (PORK) LOCK FLUSH 100 UNIT/ML IV SOLN
500.0000 [IU] | Freq: Once | INTRAVENOUS | Status: AC | PRN
  Administered 2020-10-23: 500 [IU]
  Filled 2020-10-23: qty 5

## 2020-10-23 NOTE — Patient Instructions (Signed)

## 2020-12-18 ENCOUNTER — Other Ambulatory Visit: Payer: Self-pay | Admitting: Physician Assistant

## 2020-12-18 DIAGNOSIS — C8118 Nodular sclerosis classical Hodgkin lymphoma, lymph nodes of multiple sites: Secondary | ICD-10-CM

## 2020-12-19 ENCOUNTER — Ambulatory Visit (HOSPITAL_COMMUNITY)
Admission: RE | Admit: 2020-12-19 | Discharge: 2020-12-19 | Disposition: A | Discharge status: Home / Self Care | Payer: Self-pay | Source: Ambulatory Visit | Attending: Internal Medicine | Admitting: Internal Medicine

## 2020-12-19 ENCOUNTER — Inpatient Hospital Stay: Payer: Medicaid Other | Attending: Radiation Oncology

## 2020-12-19 ENCOUNTER — Inpatient Hospital Stay: Payer: Medicaid Other

## 2020-12-19 ENCOUNTER — Other Ambulatory Visit: Payer: Self-pay

## 2020-12-19 ENCOUNTER — Encounter (HOSPITAL_COMMUNITY): Payer: Self-pay

## 2020-12-19 DIAGNOSIS — C8118 Nodular sclerosis classical Hodgkin lymphoma, lymph nodes of multiple sites: Secondary | ICD-10-CM

## 2020-12-19 DIAGNOSIS — C8112 Nodular sclerosis classical Hodgkin lymphoma, intrathoracic lymph nodes: Secondary | ICD-10-CM | POA: Insufficient documentation

## 2020-12-19 DIAGNOSIS — Z95828 Presence of other vascular implants and grafts: Secondary | ICD-10-CM

## 2020-12-19 DIAGNOSIS — C8190 Hodgkin lymphoma, unspecified, unspecified site: Secondary | ICD-10-CM | POA: Insufficient documentation

## 2020-12-19 LAB — CBC WITH DIFFERENTIAL (CANCER CENTER ONLY)
Abs Immature Granulocytes: 0.01 10*3/uL (ref 0.00–0.07)
Basophils Absolute: 0 10*3/uL (ref 0.0–0.1)
Basophils Relative: 0 %
Eosinophils Absolute: 0.2 10*3/uL (ref 0.0–0.5)
Eosinophils Relative: 3 %
HCT: 37.9 % (ref 36.0–46.0)
Hemoglobin: 12.7 g/dL (ref 12.0–15.0)
Immature Granulocytes: 0 %
Lymphocytes Relative: 36 %
Lymphs Abs: 1.9 10*3/uL (ref 0.7–4.0)
MCH: 28.9 pg (ref 26.0–34.0)
MCHC: 33.5 g/dL (ref 30.0–36.0)
MCV: 86.3 fL (ref 80.0–100.0)
Monocytes Absolute: 0.4 10*3/uL (ref 0.1–1.0)
Monocytes Relative: 7 %
Neutro Abs: 2.9 10*3/uL (ref 1.7–7.7)
Neutrophils Relative %: 54 %
Platelet Count: 304 10*3/uL (ref 150–400)
RBC: 4.39 MIL/uL (ref 3.87–5.11)
RDW: 13.2 % (ref 11.5–15.5)
WBC Count: 5.3 10*3/uL (ref 4.0–10.5)
nRBC: 0 % (ref 0.0–0.2)

## 2020-12-19 LAB — CMP (CANCER CENTER ONLY)
ALT: 21 U/L (ref 0–44)
AST: 22 U/L (ref 15–41)
Albumin: 4 g/dL (ref 3.5–5.0)
Alkaline Phosphatase: 64 U/L (ref 38–126)
Anion gap: 8 (ref 5–15)
BUN: 14 mg/dL (ref 6–20)
CO2: 24 mmol/L (ref 22–32)
Calcium: 8.9 mg/dL (ref 8.9–10.3)
Chloride: 107 mmol/L (ref 98–111)
Creatinine: 0.66 mg/dL (ref 0.44–1.00)
GFR, Estimated: >60 mL/min (ref >60)
Glucose, Bld: 88 mg/dL (ref 70–99)
Potassium: 4 mmol/L (ref 3.5–5.1)
Sodium: 139 mmol/L (ref 135–145)
Total Bilirubin: 0.5 mg/dL (ref 0.3–1.2)
Total Protein: 7.2 g/dL (ref 6.5–8.1)

## 2020-12-19 MED ORDER — SODIUM CHLORIDE 0.9% FLUSH
10.0000 mL | INTRAVENOUS | Status: DC | PRN
  Administered 2020-12-19: 10 mL
  Filled 2020-12-19: qty 10

## 2020-12-19 MED ORDER — IOHEXOL 300 MG/ML  SOLN
75.0000 mL | Freq: Once | INTRAMUSCULAR | Status: AC | PRN
  Administered 2020-12-19: 75 mL via INTRAVENOUS

## 2020-12-19 MED ORDER — HEPARIN SOD (PORK) LOCK FLUSH 100 UNIT/ML IV SOLN: 500.0000 [IU] | Freq: Once | INTRAVENOUS | Status: AC

## 2020-12-19 MED ORDER — HEPARIN SOD (PORK) LOCK FLUSH 100 UNIT/ML IV SOLN
INTRAVENOUS | Status: AC
  Administered 2020-12-19: 500 [IU] via INTRAVENOUS
  Filled 2020-12-19: qty 5

## 2020-12-21 ENCOUNTER — Other Ambulatory Visit: Payer: Self-pay

## 2020-12-21 ENCOUNTER — Encounter: Payer: Self-pay | Admitting: Internal Medicine

## 2020-12-21 ENCOUNTER — Inpatient Hospital Stay (HOSPITAL_BASED_OUTPATIENT_CLINIC_OR_DEPARTMENT_OTHER): Payer: Medicaid Other | Admitting: Internal Medicine

## 2020-12-21 ENCOUNTER — Telehealth: Payer: Self-pay | Admitting: Internal Medicine

## 2020-12-21 VITALS — BP 125/72 | HR 82 | Temp 97.3°F | Resp 18 | Ht 62.0 in | Wt 190.9 lb

## 2020-12-21 DIAGNOSIS — C8111 Nodular sclerosis classical Hodgkin lymphoma, lymph nodes of head, face, and neck: Secondary | ICD-10-CM

## 2020-12-21 DIAGNOSIS — Z5111 Encounter for antineoplastic chemotherapy: Secondary | ICD-10-CM

## 2020-12-21 NOTE — Telephone Encounter (Signed)
Scheduled follow-up appointments per 4/28 los. Patient is aware. ?

## 2020-12-21 NOTE — Progress Notes (Signed)
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  PRIOR THERAPY:   1) Systemic chemotherapy with ABVD on days 1 and 15 every 4 weeks.  First dose June 23, 2019.  She is status post 2 cycles.  2) involved field radiotherapy under the care of Dr. Isidore Moos  CURRENT THERAPY: Observation.  INTERVAL HISTORY: Faith Pope 30 y.o. female returns to the clinic today for 6 months follow-up visit.  The patient is feeling fine today with no concerning complaints.  She denied having any chest pain, shortness of breath, cough or hemoptysis.  She denied having any fever or chills.  She has no nausea, vomiting, diarrhea or constipation.  She denied having any headache or visual changes.  She has no bleeding or palpable lymphadenopathy.  The patient is here today for evaluation with repeat blood work as well as CT scan for restaging of her disease.   MEDICAL HISTORY: Past Medical History:  Diagnosis Date  . Asthma   . Cancer ([place])    Hodgkin Lymphoma diagnosed 04/2019  . History of radiation therapy 09/08/19- 09/22/19   Right neck/ chest 11 fractions of 2 Gy each to total 22 Gy.     ALLERGIES:  is allergic to other, shellfish allergy, amoxicillin, and penicillins.  MEDICATIONS:  Current Outpatient Medications  Medication Sig Dispense Refill  . albuterol (PROVENTIL) (2.5 MG/3ML) 0.083% nebulizer solution INHALE 1 VIAL VIA NEBULIZER EVERY 6 HOURS AS NEEDED FOR WHEEZING OR SHORTNESS OF BREATH 75 mL 12  . albuterol (VENTOLIN HFA) 108 (90 Base) MCG/ACT inhaler Inhale 1-2 puffs into the lungs every 6 (six) hours as needed for wheezing or shortness of breath.    . allopurinol (ZYLOPRIM) 100 MG tablet Take 1 tablet (100 mg total) by mouth daily. 60 tablet 2  . aspirin-acetaminophen-caffeine (EXCEDRIN MIGRAINE) 250-250-65 MG tablet Take  2 tablets by mouth every 6 (six) hours as needed for headache.    . doxycycline (VIBRAMYCIN) 100 MG capsule TAKE 1 CAPSULE BY MOUTH 2 TIMES DAILY FOR 7 DAYS 14 capsule 0  . lidocaine (XYLOCAINE) 2 % solution Patient: Mix 1part 2% viscous lidocaine, 1part H20. Swallow 30m of diluted mixture, 322m before meals and at bedtime, up to QID (Patient not taking: Reported on 09/19/2020) 200 mL 3  . lidocaine-prilocaine (EMLA) cream APPLY TOPICALLY AS NEEDED AS DIRECTED 30 g 0  . LORazepam (ATIVAN) 0.5 MG tablet Take 1 tablet, 30 min before radiotherapy, PRN anxiety. Do not drive after taking this medication. (Patient not taking: Reported on 09/19/2020) 10 tablet 0  . predniSONE (DELTASONE) 50 MG tablet TAKE 1 TABLET BY MOUTH ONCE A DAY FOR 4 DAYS (Patient not taking: Reported on 09/19/2020) 4 tablet 0  . sucralfate (CARAFATE) 1 g tablet Dissolve 1 tablet in 10 mL H20 and swallow 30 min prior to meals and bedtime. (Patient not taking: Reported on 09/19/2020) 40 tablet 2   No current facility-administered medications for this visit.    SURGICAL HISTORY:  Past Surgical History:  Procedure Laterality Date  . IR BONE MARROW BIOPSY & ASPIRATION  06/21/2019  . IR IMAGING GUIDED PORT INSERTION  06/21/2019  . MASS BIOPSY Right 05/11/2019   Procedure: EXCISIONAL BIOPSY OF RIGHT CERVICAL LYMPH NODE;  Surgeon: BaMelida QuitterMD;  [facility] [facility] Service: ENT;  Laterality: Right;  . TONSILLECTOMY  REVIEW OF SYSTEMS:  A comprehensive review of systems was negative.   PHYSICAL EXAMINATION: General appearance: alert, cooperative and no distress Head: Normocephalic, without obvious abnormality, atraumatic Neck: no adenopathy, no JVD, supple, symmetrical, trachea midline and thyroid not enlarged, symmetric, no tenderness/mass/nodules Lymph nodes: Cervical, supraclavicular, and axillary nodes normal. Resp: clear to auscultation bilaterally Back: negative, no kyphosis present, symmetric, no curvature. ROM normal.  No CVA tenderness. Cardio: regular rate and rhythm, S1, S2 normal, no murmur, click, rub or gallop GI: soft, non-tender; bowel sounds normal; no masses,  no organomegaly Extremities: extremities normal, atraumatic, no cyanosis or edema  ECOG PERFORMANCE STATUS: 0 - Asymptomatic  Blood pressure 125/72, pulse 82, temperature (!) 97.3 F (36.3 C), temperature source Tympanic, resp. rate 18, height _0  (1.575 m), weight 190 lb 14.4 oz (86.6 kg), last menstrual period 12/19/2020, SpO2 100 %.  LABORATORY DATA: Lab Results  Component Value Date   WBC 5.3 12/19/2020   HGB 12.7 12/19/2020   HCT 37.9 12/19/2020   MCV 86.3 12/19/2020   PLT 304 12/19/2020      Chemistry      Component Value Date/Time   NA 139 12/19/2020 1117   K 4.0 12/19/2020 1117   CL 107 12/19/2020 1117   CO2 24 12/19/2020 1117   BUN 14 12/19/2020 1117   CREATININE 0.66 12/19/2020 1117      Component Value Date/Time   CALCIUM 8.9 12/19/2020 1117   ALKPHOS 64 12/19/2020 1117   AST 22 12/19/2020 1117   ALT 21 12/19/2020 1117   BILITOT 0.5 12/19/2020 1117       RADIOGRAPHIC STUDIES: CT Chest W Contrast  Result Date: 12/19/2020 CLINICAL DATA:  Hodgkin's lymphoma, chemotherapy and XRT complete EXAM: CT CHEST WITH CONTRAST TECHNIQUE: Multidetector CT imaging of the chest was performed during intravenous contrast administration. CONTRAST:  18m OMNIPAQUE IOHEXOL 300 MG/ML  SOLN COMPARISON:  06/20/2020 FINDINGS: Cardiovascular: Right chest port catheter. Normal heart size. No pericardial effusion. Mediastinum/Nodes: Stable post treatment appearance of numerous mediastinal and hilar lymph nodes as well as soft tissue in the anterior mediastinum, likely thymic remnant. Index pretracheal node measures 1.2 x 1.1 cm, unchanged (series 2, image 43). Thyroid gland, trachea, and esophagus demonstrate no significant findings. Lungs/Pleura: Unchanged bandlike scarring of the dependent medial right lung base with focal  bronchiectasis and an impacted bronchiole (series 7, image 98). Stable, benign fissural nodules of the right middle lobe (series 7, image 68). No pleural effusion or pneumothorax. Upper Abdomen: No acute abnormality. Musculoskeletal: No chest wall mass or suspicious bone lesions identified. IMPRESSION: 1. Stable post treatment appearance of numerous mediastinal and hilar lymph nodes as well as soft tissue in the anterior mediastinum, consistent with sustained treatment response of lymphoma. 2. Unchanged scarring and focal bronchiectasis of the dependent right lower lobe. Electronically Signed   By: AEddie CandleM.D.   On: 12/19/2020 15:48    ASSESSMENT AND PLAN: This is a very pleasant 30years old African-American female recently diagnosed with a stage IIA Hodgkin lymphoma, nodular sclerosing subtype diagnosed in October 2020 and presented with right cervical and supraclavicular lymphadenopathy as well as right mediastinal lymphadenopathy.  The patient has no evidence of disease below the diaphragm. She does not have a bulky disease and she has no significant unfavorable risk factors. The patient underwent systemic chemotherapy with ABVD status post 2 cycles. This was followed by involved field radiation under the care of Dr. SIsidore Moos The patient is currently on observation and she is feeling fine  today with no concerning complaints. She had repeat CT scan of the chest performed recently.  I personally and independently reviewed the scans and discussed the results with the patient today. Her scan showed no concerning findings for disease recurrence or metastasis. I recommended for the patient to continue on observation with repeat blood work in 6 months. She will have a Port-A-Cath flush every 2 months. The patient voices understanding of current disease status and treatment options and is in agreement with the current care plan.  All questions were answered. The patient knows to call the clinic with  any problems, questions or concerns. We can certainly see the patient much sooner if necessary.   Disclaimer: This note was dictated with voice recognition software. Similar sounding words can inadvertently be transcribed and may not be corrected upon review.

## 2021-02-03 ENCOUNTER — Other Ambulatory Visit (HOSPITAL_COMMUNITY): Payer: Self-pay

## 2021-02-03 ENCOUNTER — Other Ambulatory Visit: Payer: Self-pay

## 2021-02-03 ENCOUNTER — Emergency Department (HOSPITAL_COMMUNITY): Payer: Self-pay

## 2021-02-03 ENCOUNTER — Encounter: Payer: Self-pay | Admitting: Internal Medicine

## 2021-02-03 ENCOUNTER — Encounter (HOSPITAL_COMMUNITY): Payer: Self-pay | Admitting: Emergency Medicine

## 2021-02-03 ENCOUNTER — Emergency Department (HOSPITAL_COMMUNITY)
Admission: EM | Admit: 2021-02-03 | Discharge: 2021-02-03 | Disposition: A | Discharge status: Home / Self Care | Payer: Self-pay | Attending: Emergency Medicine | Admitting: Emergency Medicine

## 2021-02-03 DIAGNOSIS — J069 Acute upper respiratory infection, unspecified: Secondary | ICD-10-CM | POA: Insufficient documentation

## 2021-02-03 DIAGNOSIS — J45909 Unspecified asthma, uncomplicated: Secondary | ICD-10-CM | POA: Insufficient documentation

## 2021-02-03 DIAGNOSIS — Z7982 Long term (current) use of aspirin: Secondary | ICD-10-CM | POA: Insufficient documentation

## 2021-02-03 DIAGNOSIS — Z8571 Personal history of Hodgkin lymphoma: Secondary | ICD-10-CM | POA: Insufficient documentation

## 2021-02-03 DIAGNOSIS — F17210 Nicotine dependence, cigarettes, uncomplicated: Secondary | ICD-10-CM | POA: Insufficient documentation

## 2021-02-03 MED ORDER — PREDNISONE 10 MG PO TABS
40.0000 mg | ORAL_TABLET | Freq: Every day | ORAL | 0 refills | Status: DC
  Filled 2021-02-03: qty 16, 4d supply, fill #0

## 2021-02-03 MED ORDER — ALBUTEROL SULFATE HFA 108 (90 BASE) MCG/ACT IN AERS
2.0000 | INHALATION_SPRAY | Freq: Once | RESPIRATORY_TRACT | Status: AC
  Administered 2021-02-03: 15:00:00 2 via RESPIRATORY_TRACT
  Filled 2021-02-03: qty 6.7

## 2021-02-03 MED ORDER — BENZONATATE 100 MG PO CAPS
100.0000 mg | ORAL_CAPSULE | Freq: Three times a day (TID) | ORAL | 0 refills | Status: DC | PRN
  Filled 2021-02-03: qty 15, 5d supply, fill #0

## 2021-02-03 MED ORDER — IPRATROPIUM-ALBUTEROL 0.5-2.5 (3) MG/3ML IN SOLN
3.0000 mL | Freq: Once | RESPIRATORY_TRACT | Status: AC
  Administered 2021-02-03: 14:00:00 3 mL via RESPIRATORY_TRACT
  Filled 2021-02-03: qty 3

## 2021-02-03 MED ORDER — BENZONATATE 100 MG PO CAPS
100.0000 mg | ORAL_CAPSULE | Freq: Once | ORAL | Status: AC
  Administered 2021-02-03: 14:00:00 100 mg via ORAL
  Filled 2021-02-03: qty 1

## 2021-02-03 MED ORDER — PREDNISONE 20 MG PO TABS
40.0000 mg | ORAL_TABLET | Freq: Once | ORAL | Status: AC
  Administered 2021-02-03: 14:00:00 40 mg via ORAL
  Filled 2021-02-03: qty 2

## 2021-02-03 MED ORDER — ALBUTEROL SULFATE HFA 108 (90 BASE) MCG/ACT IN AERS
2.0000 | INHALATION_SPRAY | RESPIRATORY_TRACT | 0 refills | Status: DC | PRN
Start: 2021-02-03 — End: 2024-05-07
  Filled 2021-02-03: qty 18, 16d supply, fill #0

## 2021-02-03 NOTE — ED Provider Notes (Signed)
[facility] DEPT Provider Note   CSN: [id number] Arrival date & time: 02/03/21  1058     History Chief Complaint  Patient presents with   Cough    Cadince Hilscher is a 30 y.o. female.  The history is provided by the patient and medical records.  Cough Makiah Foye is a 30 y.o. female who presents to the Emergency Department complaining of cough.  She presents to the ED for evaluation of sore throat three days ago, cough yesterday.  Had a sick contact at home with URI sxs.  Has a hx/o pna, hodgkins lymphoma s/p chemo and radiation.  Feels like prior pna.  Has sob/doe.  Hurts with deep breaths.  Scant sputum production.  No fever/chills.  No sneezing/runny nose, HA, N/V, lower extremity edema.    Tried theraflu, nyquil, dayquil no significant change in sxs.     Past Medical History:  Diagnosis Date   Asthma    Cancer ([place])    Hodgkin Lymphoma diagnosed 04/2019   History of radiation therapy 09/08/19- 09/22/19   Right neck/ chest 11 fractions of 2 Gy each to total 22 Gy.     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, face or neck ([place]) 09/20/2020   Port-A-Cath in place 08/04/2019   Goals of care, counseling/discussion 06/16/2019   Encounter for antineoplastic chemotherapy 06/16/2019   Hodgkin's lymphoma ([place]) 06/02/2019   Cigar smoker 06/02/2019    Past Surgical History:  Procedure Laterality Date   IR BONE MARROW BIOPSY & ASPIRATION  06/21/2019   IR IMAGING GUIDED PORT INSERTION  06/21/2019   MASS BIOPSY Right 05/11/2019   Procedure: EXCISIONAL BIOPSY OF RIGHT CERVICAL LYMPH NODE;  Surgeon: Melida Quitter, MD;  Location: [facility];  Service: ENT;  Laterality: Right;   TONSILLECTOMY       OB History   No obstetric history on file.     No family history on file.  Social History   Tobacco Use   Smoking status: Every Day    Packs/day: 1.00    Pack years: 0.00    Types:  Cigars, Cigarettes   Smokeless tobacco: Never   Tobacco comments:    Per pt: 2-3 cigars daily (12/21/20)  Vaping Use   Vaping Use: Never used  Substance Use Topics   Alcohol use: Yes    Comment: rare on special occasions   Drug use: No    Home Medications Prior to Admission medications   Medication Sig Start Date End Date Taking? Authorizing Provider  albuterol (VENTOLIN HFA) 108 (90 Base) MCG/ACT inhaler Inhale 2-4 puffs into the lungs every 4 (four) hours as needed for wheezing or shortness of breath. 02/03/21  Yes Quintella Reichert, MD  benzonatate (TESSALON) 100 MG capsule Take 1 capsule (100 mg total) by mouth 3 (three) times daily as needed for cough. 02/03/21  Yes Quintella Reichert, MD  predniSONE (DELTASONE) 10 MG tablet Take 4 tablets (40 mg total) by mouth daily. 02/03/21  Yes Quintella Reichert, MD  albuterol (PROVENTIL) (2.5 MG/3ML) 0.083% nebulizer solution INHALE 1 VIAL VIA NEBULIZER EVERY 6 HOURS AS NEEDED FOR WHEEZING OR SHORTNESS OF BREATH 08/29/20 08/29/21  Long, Wonda Olds, MD  albuterol (VENTOLIN HFA) 108 (90 Base) MCG/ACT inhaler Inhale 1-2 puffs into the lungs every 6 (six) hours as needed for wheezing or shortness of breath.    [provider]  allopurinol (ZYLOPRIM) 100 MG tablet Take 1 tablet (100  mg total) by mouth daily. 06/23/19   Heilingoetter, Cassandra L, PA-C  aspirin-acetaminophen-caffeine (EXCEDRIN MIGRAINE) 250-250-65 MG tablet Take 2 tablets by mouth every 6 (six) hours as needed for headache.    [provider]  doxycycline (VIBRAMYCIN) 100 MG capsule TAKE 1 CAPSULE BY MOUTH 2 TIMES DAILY FOR 7 DAYS 08/29/20 08/29/21  Long, Wonda Olds, MD  lidocaine (XYLOCAINE) 2 % solution Patient: Mix 1part 2% viscous lidocaine, 1part H20. Swallow 37m of diluted mixture, 360m before meals and at bedtime, up to QID Patient not taking: Reported on 12/21/2020 09/20/19   SqEppie GibsonMD  lidocaine-prilocaine (EMLA) cream APPLY TOPICALLY AS NEEDED AS DIRECTED 06/22/20 06/22/21   MoCurt BearsMD  LORazepam (ATIVAN) 0.5 MG tablet Take 1 tablet, 30 min before radiotherapy, PRN anxiety. Do not drive after taking this medication. 09/08/19   SqEppie GibsonMD  sucralfate (CARAFATE) 1 g tablet Dissolve 1 tablet in 10 mL H20 and swallow 30 min prior to meals and bedtime. 09/13/19   SqEppie GibsonMD  omeprazole (PRILOSEC) 40 MG capsule Take 1 capsule (40 mg total) by mouth daily. Patient not taking: Reported on 08/30/2019 07/28/19 07/13/20  TaHarle Stanford PA-C  prochlorperazine (COMPAZINE) 10 MG tablet Take 1 tablet (10 mg total) by mouth every 6 (six) hours as needed for nausea or vomiting. Patient not taking: Reported on 08/30/2019 06/23/19 07/13/20  Heilingoetter, Cassandra L, PA-C    Allergies    Other, Shellfish allergy, Amoxicillin, and Penicillins  Review of Systems   Review of Systems  Respiratory:  Positive for cough.   All other systems reviewed and are negative.  Physical Exam Updated Vital Signs BP 129/84 (BP Location: Left Arm)   Pulse (!) 101   Temp 98.8 F (37.1 C) (Oral)   Resp 20   LMP 01/12/2021   SpO2 98%   Physical Exam Vitals and nursing note reviewed.  Constitutional:      Appearance: She is well-developed.  HENT:     Head: Normocephalic and atraumatic.  Cardiovascular:     Rate and Rhythm: Normal rate and regular rhythm.     Heart sounds: No murmur heard. Pulmonary:     Effort: Pulmonary effort is normal. No respiratory distress.     Comments: Diffuse wheezes and rhonchi Abdominal:     Palpations: Abdomen is soft.     Tenderness: There is no abdominal tenderness. There is no guarding or rebound.  Musculoskeletal:        General: No swelling or tenderness.  Skin:    General: Skin is warm and dry.  Neurological:     Mental Status: She is alert and oriented to person, place, and time.  Psychiatric:        Behavior: Behavior normal.    ED Results / Procedures / Treatments   Labs (all labs ordered are listed, but only abnormal  results are displayed) Labs Reviewed - No data to display  EKG None  Radiology DG Chest 2 View  Result Date: 02/03/2021 CLINICAL DATA:  Cough EXAM: CHEST - 2 VIEW COMPARISON:  August 28, 2020, December 19, 2020 FINDINGS: The cardiomediastinal silhouette is unchanged in contour.RIGHT chest port with tip terminating over the RIGHT atrium. No pleural effusion. No pneumothorax. Unchanged RIGHT nodular opacity projecting over the RIGHT inferior paramediastinal border consistent with impacted mucous within bronchiectasis seen on prior CT. Visualized abdomen is unremarkable. No acute osseous abnormality. IMPRESSION: No acute cardiopulmonary abnormality. Electronically Signed   By: StValentino SaxonD   On: 02/03/2021 12:43  Procedures Procedures   Medications Ordered in ED Medications  albuterol (VENTOLIN HFA) 108 (90 Base) MCG/ACT inhaler 2 puff (has no administration in time range)  predniSONE (DELTASONE) tablet 40 mg (40 mg Oral Given 02/03/21 1402)  ipratropium-albuterol (DUONEB) 0.5-2.5 (3) MG/3ML nebulizer solution 3 mL (3 mLs Nebulization Given 02/03/21 1404)  benzonatate (TESSALON) capsule 100 mg (100 mg Oral Given 02/03/21 1402)    ED Course  I have reviewed the triage vital signs and the nursing notes.  Pertinent labs & imaging results that were available during my care of the patient were reviewed by me and considered in my medical decision making (see chart for details).    MDM Rules/Calculators/A&P                         patient with history of asthma, Hodgkin's lymphoma in remission here for evaluation of shortness of breath. She did have a sick contact a few days ago. On examination she has wheezing and rhonchi but no respiratory distress. After treatment with albuterol her symptoms are partially improved. X-ray without evidence of infiltrate or pneumonia. Presentation is not consistent with CHF, pneumonia, PE. Discussed with patient home care for viral URI with asthma  exacerbation. Discussed outpatient follow-up and return precautions.   Final Clinical Impression(s) / ED Diagnoses Final diagnoses:  Viral URI with cough    Rx / DC Orders ED Discharge Orders          Ordered    predniSONE (DELTASONE) 10 MG tablet  Daily        02/03/21 1455    benzonatate (TESSALON) 100 MG capsule  3 times daily PRN        02/03/21 1455    albuterol (VENTOLIN HFA) 108 (90 Base) MCG/ACT inhaler  Every 4 hours PRN        02/03/21 1455             Quintella Reichert, MD 02/03/21 1457

## 2021-02-03 NOTE — ED Triage Notes (Signed)
Patient c/o cough x2 days. Denies fever, body aches.

## 2021-02-05 ENCOUNTER — Emergency Department (HOSPITAL_COMMUNITY): Payer: Medicaid Other

## 2021-02-05 ENCOUNTER — Other Ambulatory Visit: Payer: Self-pay

## 2021-02-05 ENCOUNTER — Emergency Department (HOSPITAL_COMMUNITY)
Admission: EM | Admit: 2021-02-05 | Discharge: 2021-02-05 | Disposition: A | Discharge status: Home / Self Care | Payer: Medicaid Other | Attending: Emergency Medicine | Admitting: Emergency Medicine

## 2021-02-05 DIAGNOSIS — Z859 Personal history of malignant neoplasm, unspecified: Secondary | ICD-10-CM | POA: Insufficient documentation

## 2021-02-05 DIAGNOSIS — O99330 Smoking (tobacco) complicating pregnancy, unspecified trimester: Secondary | ICD-10-CM | POA: Insufficient documentation

## 2021-02-05 DIAGNOSIS — J4521 Mild intermittent asthma with (acute) exacerbation: Secondary | ICD-10-CM

## 2021-02-05 DIAGNOSIS — O99519 Diseases of the respiratory system complicating pregnancy, unspecified trimester: Secondary | ICD-10-CM | POA: Insufficient documentation

## 2021-02-05 DIAGNOSIS — F17210 Nicotine dependence, cigarettes, uncomplicated: Secondary | ICD-10-CM | POA: Insufficient documentation

## 2021-02-05 DIAGNOSIS — Z3A00 Weeks of gestation of pregnancy not specified: Secondary | ICD-10-CM | POA: Insufficient documentation

## 2021-02-05 DIAGNOSIS — R0789 Other chest pain: Secondary | ICD-10-CM | POA: Insufficient documentation

## 2021-02-05 DIAGNOSIS — J189 Pneumonia, unspecified organism: Secondary | ICD-10-CM

## 2021-02-05 LAB — CBC WITH DIFFERENTIAL/PLATELET
Abs Immature Granulocytes: 0.07 10*3/uL (ref 0.00–0.07)
Basophils Absolute: 0 10*3/uL (ref 0.0–0.1)
Basophils Relative: 0 %
Eosinophils Absolute: 0.2 10*3/uL (ref 0.0–0.5)
Eosinophils Relative: 2 %
HCT: 42.9 % (ref 36.0–46.0)
Hemoglobin: 13.2 g/dL (ref 12.0–15.0)
Immature Granulocytes: 1 %
Lymphocytes Relative: 14 %
Lymphs Abs: 1.2 10*3/uL (ref 0.7–4.0)
MCH: 28.7 pg (ref 26.0–34.0)
MCHC: 30.8 g/dL (ref 30.0–36.0)
MCV: 93.3 fL (ref 80.0–100.0)
Monocytes Absolute: 0.9 10*3/uL (ref 0.1–1.0)
Monocytes Relative: 10 %
Neutro Abs: 6.5 10*3/uL (ref 1.7–7.7)
Neutrophils Relative %: 73 %
Platelets: 243 10*3/uL (ref 150–400)
RBC: 4.6 MIL/uL (ref 3.87–5.11)
RDW: 13.6 % (ref 11.5–15.5)
WBC: 8.9 10*3/uL (ref 4.0–10.5)
nRBC: 0 % (ref 0.0–0.2)

## 2021-02-05 LAB — BASIC METABOLIC PANEL
Anion gap: 10 (ref 5–15)
BUN: 14 mg/dL (ref 6–20)
CO2: 21 mmol/L — ABNORMAL LOW (ref 22–32)
Calcium: 9.3 mg/dL (ref 8.9–10.3)
Chloride: 107 mmol/L (ref 98–111)
Creatinine, Ser: 0.64 mg/dL (ref 0.44–1.00)
GFR, Estimated: >60 mL/min (ref >60)
Glucose, Bld: 98 mg/dL (ref 70–99)
Potassium: 4.3 mmol/L (ref 3.5–5.1)
Sodium: 138 mmol/L (ref 135–145)

## 2021-02-05 LAB — I-STAT BETA HCG BLOOD, ED (MC, WL, AP ONLY): I-stat hCG, quantitative: 5.8 m[IU]/mL — ABNORMAL HIGH (ref <5)

## 2021-02-05 LAB — D-DIMER, QUANTITATIVE: D-Dimer, Quant: 2.59 ug/mL-FEU — ABNORMAL HIGH (ref 0.00–0.50)

## 2021-02-05 MED ORDER — DOXYCYCLINE HYCLATE 100 MG PO CAPS
100.0000 mg | ORAL_CAPSULE | Freq: Two times a day (BID) | ORAL | 0 refills | Status: AC
  Filled 2021-02-05: qty 20, 10d supply, fill #0

## 2021-02-05 MED ORDER — IOHEXOL 350 MG/ML SOLN
50.0000 mL | Freq: Once | INTRAVENOUS | Status: AC | PRN
  Administered 2021-02-05: 50 mL via INTRAVENOUS

## 2021-02-05 MED ORDER — IPRATROPIUM-ALBUTEROL 0.5-2.5 (3) MG/3ML IN SOLN
3.0000 mL | Freq: Once | RESPIRATORY_TRACT | Status: AC
  Administered 2021-02-05: 3 mL via RESPIRATORY_TRACT
  Filled 2021-02-05: qty 3

## 2021-02-05 MED ORDER — DOXYCYCLINE HYCLATE 100 MG PO TABS
100.0000 mg | ORAL_TABLET | Freq: Once | ORAL | Status: AC
  Administered 2021-02-05: 100 mg via ORAL
  Filled 2021-02-05: qty 1

## 2021-02-05 MED ORDER — LACTATED RINGERS IV BOLUS
1000.0000 mL | Freq: Once | INTRAVENOUS | Status: AC
  Administered 2021-02-05: 1000 mL via INTRAVENOUS

## 2021-02-05 NOTE — ED Triage Notes (Signed)
Pt reports cp and shob since Thursday. Seen at [facility] for same, told it was a viral URI and given rx for prednisone and tessalon. Pt taking without relief. Recently around a child who had RSV.

## 2021-02-05 NOTE — ED Notes (Signed)
Called CT to inform pt is ready to transport there.

## 2021-02-05 NOTE — ED Provider Notes (Signed)
[facility] EMERGENCY DEPARTMENT Provider Note   CSN: [id number] Arrival date & time: 02/05/21  1352     History Chief Complaint  Patient presents with   Shortness of Breath   Chest Pain    Faith Pope is a 30 y.o. female.  The history is provided by the patient and medical records.  URI Presenting symptoms: congestion, cough and rhinorrhea   Presenting symptoms: no ear pain, no facial pain and no sore throat   Cough:    Cough characteristics:  Non-productive and dry   Severity:  Moderate   Onset quality:  Gradual   Duration:  5 days   Timing:  Constant   Progression:  Worsening   Chronicity:  New Fever:    Duration:  4 days   Timing:  Intermittent   Temp source:  Subjective   Progression:  Unchanged Severity:  Mild Onset quality:  Gradual Duration:  4 days Timing:  Constant Progression:  Unchanged Chronicity:  New Relieved by:  Nothing Worsened by:  Breathing Ineffective treatments:  Breathing, certain positions, decongestant, drinking, inhaler, OTC medications and prescription medications Associated symptoms: wheezing   Associated symptoms: no sinus pain, no sneezing and no swollen glands   Risk factors: chronic respiratory disease (asthma), immunosuppression (previous. Lymphoma in remission, last chemo in 2020) and sick contacts (kid with RSV last week)   Risk factors: not elderly, no chronic cardiac disease, no chronic kidney disease and no diabetes mellitus     Was seen 2 days ago at a different emergency department, diagnosed with URI and asthma exacerbation.  Started on prednisone and Tessalon Perles.  URI symptoms have not improved and on prednisone has been only making her unable to sleep at night, feeling palpitations and anxiousness.     Past Medical History:  Diagnosis Date   Asthma    Cancer ([place])    Hodgkin Lymphoma diagnosed 04/2019   History of radiation therapy 09/08/19- 09/22/19   Right neck/ chest 11 fractions of 2 Gy each  to total 22 Gy.     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, face or neck ([place]) 09/20/2020   Port-A-Cath in place 08/04/2019   Goals of care, counseling/discussion 06/16/2019   Encounter for antineoplastic chemotherapy 06/16/2019   Hodgkin's lymphoma ([place]) 06/02/2019   Cigar smoker 06/02/2019    Past Surgical History:  Procedure Laterality Date   IR BONE MARROW BIOPSY & ASPIRATION  06/21/2019   IR IMAGING GUIDED PORT INSERTION  06/21/2019   MASS BIOPSY Right 05/11/2019   Procedure: EXCISIONAL BIOPSY OF RIGHT CERVICAL LYMPH NODE;  Surgeon: Melida Quitter, MD;  Location: [facility];  Service: ENT;  Laterality: Right;   TONSILLECTOMY       OB History   No obstetric history on file.     No family history on file.  Social History   Tobacco Use   Smoking status: Every Day    Packs/day: 1.00    Pack years: 0.00    Types: Cigars, Cigarettes   Smokeless tobacco: Never   Tobacco comments:    Per pt: 2-3 cigars daily (12/21/20)  Vaping Use   Vaping Use: Never used  Substance Use Topics   Alcohol use: Yes    Comment: rare on special occasions   Drug use: No    Home Medications Prior to Admission medications   Medication Sig Start Date End Date Taking? Authorizing Provider  doxycycline (VIBRAMYCIN) 100  MG capsule Take 1 capsule (100 mg total) by mouth 2 (two) times daily for 10 days. 02/06/21 02/16/21 Yes Pearson Grippe, DO  albuterol (PROVENTIL) (2.5 MG/3ML) 0.083% nebulizer solution INHALE 1 VIAL VIA NEBULIZER EVERY 6 HOURS AS NEEDED FOR WHEEZING OR SHORTNESS OF BREATH 08/29/20 08/29/21  Long, Wonda Olds, MD  albuterol (VENTOLIN HFA) 108 (90 Base) MCG/ACT inhaler Inhale 1-2 puffs into the lungs every 6 (six) hours as needed for wheezing or shortness of breath.    [provider]  albuterol (VENTOLIN HFA) 108 (90 Base) MCG/ACT inhaler Inhale 2-4 puffs into the lungs every 4 (four) hours as needed for  wheezing or shortness of breath. 02/03/21   Quintella Reichert, MD  allopurinol (ZYLOPRIM) 100 MG tablet Take 1 tablet (100 mg total) by mouth daily. 06/23/19   Heilingoetter, Cassandra L, PA-C  aspirin-acetaminophen-caffeine (EXCEDRIN MIGRAINE) 250-250-65 MG tablet Take 2 tablets by mouth every 6 (six) hours as needed for headache.    [provider]  benzonatate (TESSALON) 100 MG capsule Take 1 capsule (100 mg total) by mouth 3 (three) times daily as needed for cough. 02/03/21   Quintella Reichert, MD  lidocaine (XYLOCAINE) 2 % solution Patient: Mix 1part 2% viscous lidocaine, 1part H20. Swallow 3m of diluted mixture, 375m before meals and at bedtime, up to QID Patient not taking: Reported on 12/21/2020 09/20/19   SqEppie GibsonMD  lidocaine-prilocaine (EMLA) cream APPLY TOPICALLY AS NEEDED AS DIRECTED 06/22/20 06/22/21  MoCurt BearsMD  LORazepam (ATIVAN) 0.5 MG tablet Take 1 tablet, 30 min before radiotherapy, PRN anxiety. Do not drive after taking this medication. 09/08/19   SqEppie GibsonMD  predniSONE (DELTASONE) 10 MG tablet Take 4 tablets (40 mg total) by mouth daily. 02/03/21   ReQuintella ReichertMD  sucralfate (CARAFATE) 1 g tablet Dissolve 1 tablet in 10 mL H20 and swallow 30 min prior to meals and bedtime. 09/13/19   SqEppie GibsonMD  omeprazole (PRILOSEC) 40 MG capsule Take 1 capsule (40 mg total) by mouth daily. Patient not taking: Reported on 08/30/2019 07/28/19 07/13/20  TaHarle Stanford PA-C  prochlorperazine (COMPAZINE) 10 MG tablet Take 1 tablet (10 mg total) by mouth every 6 (six) hours as needed for nausea or vomiting. Patient not taking: Reported on 08/30/2019 06/23/19 07/13/20  Heilingoetter, Cassandra L, PA-C    Allergies    Other, Shellfish allergy, Amoxicillin, and Penicillins  Review of Systems   Review of Systems  HENT:  Positive for congestion and rhinorrhea. Negative for ear pain, sinus pain, sneezing and sore throat.   Eyes:  Negative for pain and visual  disturbance.  Respiratory:  Positive for cough and wheezing. Negative for shortness of breath.   Skin:  Negative for color change and rash.  Psychiatric/Behavioral:  Positive for sleep disturbance (cannot sleep since starting steroids 3 days ago).   All other systems reviewed and are negative.  Physical Exam Updated Vital Signs BP 138/72   Pulse 93   Temp 100.1 F (37.8 C) (Oral)   Resp (!) 26   Ht _0  (1.575 m)   Wt 86.2 kg   LMP 01/12/2021   SpO2 100%   BMI 34.75 kg/m   Physical Exam Vitals and nursing note reviewed.  Constitutional:      General: She is not in acute distress.    Appearance: She is well-developed.  HENT:     Head: Normocephalic and atraumatic.     Mouth/Throat:     Mouth: Mucous membranes are moist.  Pharynx: Posterior oropharyngeal erythema (mild) present. No oropharyngeal exudate.     Tonsils: No tonsillar exudate or tonsillar abscesses.  Eyes:     Conjunctiva/sclera: Conjunctivae normal.  Cardiovascular:     Rate and Rhythm: Normal rate and regular rhythm.     Heart sounds: No murmur heard. Pulmonary:     Effort: Pulmonary effort is normal. No tachypnea, accessory muscle usage, respiratory distress or retractions.     Breath sounds: Examination of the right-upper field reveals wheezing. Examination of the left-upper field reveals wheezing. Examination of the right-middle field reveals wheezing. Examination of the left-middle field reveals wheezing. Examination of the right-lower field reveals decreased breath sounds and wheezing. Examination of the left-lower field reveals decreased breath sounds and wheezing. Decreased breath sounds and wheezing present.  Abdominal:     Palpations: Abdomen is soft.     Tenderness: There is no abdominal tenderness.  Musculoskeletal:     Cervical back: Neck supple.  Skin:    General: Skin is warm and dry.  Neurological:     Mental Status: She is alert.    ED Results / Procedures / Treatments   Labs (all  labs ordered are listed, but only abnormal results are displayed) Labs Reviewed  BASIC METABOLIC PANEL - Abnormal; Notable for the following components:      Result Value   CO2 21 (*)    All other components within normal limits  D-DIMER, QUANTITATIVE - Abnormal; Notable for the following components:   D-Dimer, Quant 2.59 (*)    All other components within normal limits  I-STAT BETA HCG BLOOD, ED (MC, WL, AP ONLY) - Abnormal; Notable for the following components:   I-stat hCG, quantitative 5.8 (*)    All other components within normal limits  RESP PANEL BY RT-PCR (FLU A&B, COVID) ARPGX2  CBC WITH DIFFERENTIAL/PLATELET    EKG EKG Interpretation  Date/Time:  Monday February 05 2021 15:12:35 EDT Ventricular Rate:  88 PR Interval:  126 QRS Duration: 66 QT Interval:  366 QTC Calculation: 442 R Axis:   134 Text Interpretation: **Suspect arm lead reversal, interpretation assumes no reversal Unusual P axis, possible ectopic atrial rhythm with undetermined rhythm irregularity Lateral infarct , age undetermined Abnormal ECG Defective EKG SUGGEST REPEAT TRACING Confirmed by Daleen Bo [phone]) on 02/05/2021 5:58:43 PM  Radiology DG Chest 2 View  Result Date: 02/05/2021 CLINICAL DATA:  Chest pain and shortness of breath. History of Hodgkin's lymphoma EXAM: CHEST - 2 VIEW COMPARISON:  February 03, 2021 FINDINGS: Port-A-Cath tip is at the cavoatrial junction. No pneumothorax. The lungs are clear. The heart size and pulmonary vascularity are normal. No adenopathy. No bone lesions. IMPRESSION: Port-A-Cath tip at cavoatrial junction. No pneumothorax. Lungs clear. No evident adenopathy. Electronically Signed   By: Lowella Grip III M.D.   On: 02/05/2021 16:10   CT Angio Chest PE W and/or Wo Contrast  Result Date: 02/05/2021 CLINICAL DATA:  Chest pain, shortness of breath EXAM: CT ANGIOGRAPHY CHEST WITH CONTRAST TECHNIQUE: Multidetector CT imaging of the chest was performed using the standard protocol  during bolus administration of intravenous contrast. Multiplanar CT image reconstructions and MIPs were obtained to evaluate the vascular anatomy. CONTRAST:  86m OMNIPAQUE IOHEXOL 350 MG/ML SOLN COMPARISON:  12/19/2020 FINDINGS: Cardiovascular: No filling defects in the pulmonary arteries to suggest pulmonary emboli. Heart is normal size. Aorta is normal caliber. Mediastinum/Nodes: No mediastinal, hilar, or axillary adenopathy. Trachea and esophagus are unremarkable. Thyroid unremarkable. Lungs/Pleura: Ground-glass opacities are noted in the upper lobes, left greater  than right. No effusions. Upper Abdomen: Imaging into the upper abdomen demonstrates no acute findings. Musculoskeletal: Chest wall soft tissues are unremarkable. No acute bony abnormality. Review of the MIP images confirms the above findings. IMPRESSION: No evidence of pulmonary embolus. Patchy ground-glass opacities in the upper lobes bilaterally concerning for early infiltrates. Electronically Signed   By: Rolm Baptise M.D.   On: 02/05/2021 21:50    Procedures Procedures   Medications Ordered in ED Medications  ipratropium-albuterol (DUONEB) 0.5-2.5 (3) MG/3ML nebulizer solution 3 mL (3 mLs Nebulization Given 02/05/21 2241)  lactated ringers bolus 1,000 mL (1,000 mLs Intravenous New Bag/Given 02/05/21 2240)  iohexol (OMNIPAQUE) 350 MG/ML injection 50 mL (50 mLs Intravenous Contrast Given 02/05/21 2143)  doxycycline (VIBRA-TABS) tablet 100 mg (100 mg Oral Given 02/05/21 2244)    ED Course  I have reviewed the triage vital signs and the nursing notes.  Pertinent labs & imaging results that were available during my care of the patient were reviewed by me and considered in my medical decision making (see chart for details).    MDM Rules/Calculators/A&P                          Impression is asthma exacerbation, mild.  Already on steroids.  Having bilateral, diffuse inspiratory and expiratory wheezing at the time of my exam without  significant tachypnea, increased work of breathing or hypoxia. Sleep disturbance likely secondary to steroids. I reviewed all physician documentation, lab work and imaging from the ED visit 3 days ago and it was all considered in the decision making. Patient is primarily concerned about pneumonia.  Based on history and my exam, likely viral upper respiratory tract infection especially with known viral contact last week preceding symptom onset. Lab work was ordered from triage and dimer was elevated so CTA PE study was ordered.  Would be atypical for PE however left-sided chest pain does have somewhat of a pleuritic component.  Initial treatment emergency department included beta agonist nebulizer. No indication for positive pressure ventilation or supplemental oxygen at this time.  ED course: No evidence of pulmonary embolism on CT.  There are bilateral patchy groundglass opacities in the upper lobes concerning for likely developing pneumonia. Will treat with a course of doxycycline. First dose given in the ED.  On reassessment, having no evidence of increased work of breathing.  Not hypoxic.  Wheezing significantly improved after treatment as above.  She will schedule a follow up appt with her PCP for re-eval within the next 3-5 days.   Final Clinical Impression(s) / ED Diagnoses Final diagnoses:  Community acquired pneumonia, unspecified laterality  Mild intermittent asthma with exacerbation    Rx / DC Orders ED Discharge Orders          Ordered    doxycycline (VIBRAMYCIN) 100 MG capsule  2 times daily        02/05/21 [place], [place], DO 02/05/21 2330    Noemi Chapel, MD 02/10/21 [phone]

## 2021-02-05 NOTE — ED Provider Notes (Signed)
Emergency Medicine Provider Triage Evaluation Note  Faith Pope , a 30 y.o. female  was evaluated in triage.  Pt complains of SOB, Cough, palpitations x 7 days.  She was seen in the emergency department at [facility] on 6/11 for sore throat and diagnosed with URI at that time.  Was discharged with prednisone and Tessalon.  States has not been helping.  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  She states she feels like she has pneumonia has been worsening fevers and chills at home as well sore throat, cough, chest tightness.  Albuterol not helping, neither are nebulizer treatments.  Also states she has history of blood clots.  Review of Systems  Positive: Cough, SOB, palpitations, sore throat, CP, fevers, chills Negative: N/V/D, syncope  Physical Exam  BP 123/64 (BP Location: Left Arm)   Pulse (!) 105   Temp 100.1 F (37.8 C) (Oral)   Resp 16   LMP 01/12/2021   SpO2 100%  Gen:   Awake, no distress   Resp:  Normal effort, wheezing, tachypneic MSK:   Moves extremities without difficulty  Other:  Tachycardic, tearful, crying  Medical Decision Making  Medically screening exam initiated at 3:31 PM.  Appropriate orders placed.  Faith Pope was informed that the remainder of the evaluation will be completed by another provider, this initial triage assessment does not replace that evaluation, and the importance of remaining in the ED until their evaluation is complete.  This chart was dictated using voice recognition software, Dragon. Despite the best efforts of this provider to proofread and correct errors, errors may still occur which can change documentation meaning.  D-dimer added given hx of blood clots, though HPI less concerning for PE.    Faith Darling, PA-C 02/05/21 1534    Daleen Bo, MD 02/05/21 1759

## 2021-02-06 ENCOUNTER — Other Ambulatory Visit (HOSPITAL_COMMUNITY): Payer: Self-pay

## 2021-02-06 ENCOUNTER — Encounter: Payer: Self-pay | Admitting: Internal Medicine

## 2021-02-20 ENCOUNTER — Inpatient Hospital Stay: Payer: Medicaid Other | Attending: Radiation Oncology

## 2021-02-21 ENCOUNTER — Telehealth: Payer: Self-pay | Admitting: Internal Medicine

## 2021-02-21 NOTE — Telephone Encounter (Signed)
R/s appts per 6/28 sch msg. Pt aware.  

## 2021-02-23 ENCOUNTER — Encounter: Payer: Self-pay | Admitting: Internal Medicine

## 2021-02-23 ENCOUNTER — Inpatient Hospital Stay: Payer: Self-pay | Attending: Radiation Oncology

## 2021-02-23 ENCOUNTER — Other Ambulatory Visit: Payer: Self-pay

## 2021-02-23 DIAGNOSIS — C8110 Nodular sclerosis classical Hodgkin lymphoma, unspecified site: Secondary | ICD-10-CM | POA: Insufficient documentation

## 2021-02-23 DIAGNOSIS — Z452 Encounter for adjustment and management of vascular access device: Secondary | ICD-10-CM | POA: Insufficient documentation

## 2021-02-23 DIAGNOSIS — Z95828 Presence of other vascular implants and grafts: Secondary | ICD-10-CM

## 2021-02-23 MED ORDER — HEPARIN SOD (PORK) LOCK FLUSH 100 UNIT/ML IV SOLN
500.0000 [IU] | Freq: Once | INTRAVENOUS | Status: AC | PRN
Start: 2021-02-23 — End: 2021-02-23
  Administered 2021-02-23: 500 [IU]
  Filled 2021-02-23: qty 5

## 2021-02-23 MED ORDER — SODIUM CHLORIDE 0.9% FLUSH
10.0000 mL | INTRAVENOUS | Status: DC | PRN
  Administered 2021-02-23: 10 mL
  Filled 2021-02-23: qty 10

## 2021-02-23 NOTE — Patient Instructions (Signed)

## 2021-04-23 ENCOUNTER — Inpatient Hospital Stay: Payer: Self-pay | Attending: Radiation Oncology

## 2021-04-27 ENCOUNTER — Inpatient Hospital Stay: Payer: Self-pay | Attending: Radiation Oncology

## 2021-04-27 ENCOUNTER — Other Ambulatory Visit: Payer: Self-pay

## 2021-04-27 DIAGNOSIS — C8111 Nodular sclerosis classical Hodgkin lymphoma, lymph nodes of head, face, and neck: Secondary | ICD-10-CM | POA: Insufficient documentation

## 2021-04-27 DIAGNOSIS — Z95828 Presence of other vascular implants and grafts: Secondary | ICD-10-CM

## 2021-04-27 DIAGNOSIS — Z452 Encounter for adjustment and management of vascular access device: Secondary | ICD-10-CM | POA: Insufficient documentation

## 2021-04-27 MED ORDER — HEPARIN SOD (PORK) LOCK FLUSH 100 UNIT/ML IV SOLN
500.0000 [IU] | Freq: Once | INTRAVENOUS | Status: AC | PRN
Start: 2021-04-27 — End: 2021-04-27
  Administered 2021-04-27: 500 [IU]

## 2021-04-27 MED ORDER — SODIUM CHLORIDE 0.9% FLUSH
10.0000 mL | INTRAVENOUS | Status: DC | PRN
  Administered 2021-04-27: 10 mL

## 2021-06-19 ENCOUNTER — Encounter: Payer: Self-pay | Admitting: Internal Medicine

## 2021-06-19 ENCOUNTER — Other Ambulatory Visit: Payer: Self-pay | Admitting: *Deleted

## 2021-06-19 ENCOUNTER — Inpatient Hospital Stay: Payer: Medicaid Other | Attending: Radiation Oncology | Admitting: Internal Medicine

## 2021-06-19 ENCOUNTER — Other Ambulatory Visit: Payer: Self-pay

## 2021-06-19 ENCOUNTER — Inpatient Hospital Stay: Payer: Self-pay

## 2021-06-19 VITALS — BP 109/68 | HR 78 | Temp 96.7°F | Resp 17 | Wt 195.0 lb

## 2021-06-19 DIAGNOSIS — C8111 Nodular sclerosis classical Hodgkin lymphoma, lymph nodes of head, face, and neck: Secondary | ICD-10-CM

## 2021-06-19 DIAGNOSIS — C8118 Nodular sclerosis classical Hodgkin lymphoma, lymph nodes of multiple sites: Secondary | ICD-10-CM | POA: Insufficient documentation

## 2021-06-19 DIAGNOSIS — Z95828 Presence of other vascular implants and grafts: Secondary | ICD-10-CM

## 2021-06-19 DIAGNOSIS — Z1329 Encounter for screening for other suspected endocrine disorder: Secondary | ICD-10-CM

## 2021-06-19 DIAGNOSIS — D72819 Decreased white blood cell count, unspecified: Secondary | ICD-10-CM | POA: Insufficient documentation

## 2021-06-19 LAB — CMP (CANCER CENTER ONLY)
ALT: 19 U/L (ref 0–44)
AST: 23 U/L (ref 15–41)
Albumin: 3.9 g/dL (ref 3.5–5.0)
Alkaline Phosphatase: 59 U/L (ref 38–126)
Anion gap: 10 (ref 5–15)
BUN: 13 mg/dL (ref 6–20)
CO2: 22 mmol/L (ref 22–32)
Calcium: 9 mg/dL (ref 8.9–10.3)
Chloride: 106 mmol/L (ref 98–111)
Creatinine: 0.66 mg/dL (ref 0.44–1.00)
GFR, Estimated: >60 mL/min (ref >60)
Glucose, Bld: 100 mg/dL — ABNORMAL HIGH (ref 70–99)
Potassium: 3.8 mmol/L (ref 3.5–5.1)
Sodium: 138 mmol/L (ref 135–145)
Total Bilirubin: 0.4 mg/dL (ref 0.3–1.2)
Total Protein: 7.4 g/dL (ref 6.5–8.1)

## 2021-06-19 LAB — CBC WITH DIFFERENTIAL (CANCER CENTER ONLY)
Abs Immature Granulocytes: 0 10*3/uL (ref 0.00–0.07)
Basophils Absolute: 0 10*3/uL (ref 0.0–0.1)
Basophils Relative: 0 %
Eosinophils Absolute: 0.1 10*3/uL (ref 0.0–0.5)
Eosinophils Relative: 2 %
HCT: 36.4 % (ref 36.0–46.0)
Hemoglobin: 12.3 g/dL (ref 12.0–15.0)
Immature Granulocytes: 0 %
Lymphocytes Relative: 56 %
Lymphs Abs: 1.9 10*3/uL (ref 0.7–4.0)
MCH: 28.7 pg (ref 26.0–34.0)
MCHC: 33.8 g/dL (ref 30.0–36.0)
MCV: 85 fL (ref 80.0–100.0)
Monocytes Absolute: 0.4 10*3/uL (ref 0.1–1.0)
Monocytes Relative: 10 %
Neutro Abs: 1.1 10*3/uL — ABNORMAL LOW (ref 1.7–7.7)
Neutrophils Relative %: 32 %
Platelet Count: 285 10*3/uL (ref 150–400)
RBC: 4.28 MIL/uL (ref 3.87–5.11)
RDW: 13 % (ref 11.5–15.5)
Smear Review: NORMAL
WBC Count: 3.5 10*3/uL — ABNORMAL LOW (ref 4.0–10.5)
nRBC: 0 % (ref 0.0–0.2)

## 2021-06-19 LAB — LACTATE DEHYDROGENASE: LDH: 211 U/L — ABNORMAL HIGH (ref 98–192)

## 2021-06-19 MED ORDER — SODIUM CHLORIDE 0.9% FLUSH
10.0000 mL | INTRAVENOUS | Status: DC | PRN
  Administered 2021-06-19: 10 mL

## 2021-06-19 MED ORDER — HEPARIN SOD (PORK) LOCK FLUSH 100 UNIT/ML IV SOLN
500.0000 [IU] | Freq: Once | INTRAVENOUS | Status: AC | PRN
  Administered 2021-06-19: 500 [IU]

## 2021-06-19 NOTE — Progress Notes (Signed)
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  PRIOR THERAPY:   1) Systemic chemotherapy with ABVD on days 1 and 15 every 4 weeks.  First dose June 23, 2019.  She is status post 2 cycles.  2) involved field radiotherapy under the care of Dr. Isidore Moos  CURRENT THERAPY: Observation.  INTERVAL HISTORY: Faith Pope 30 y.o. female returns to the clinic today for 45-month follow-up visit accompanied by her friend.  The patient is feeling fine today with no concerning complaints.  She denied having any current chest pain, shortness of breath, cough or hemoptysis.  She denied having any fever or chills.  She has no nausea, vomiting, diarrhea or constipation.  She has no headache or visual changes.  She has no recent weight loss or night sweats.  She is here today for evaluation and repeat blood work.  MEDICAL HISTORY: Past Medical History:  Diagnosis Date   Asthma    Cancer ([place])    Hodgkin Lymphoma diagnosed 04/2019   History of radiation therapy 09/08/19- 09/22/19   Right neck/ chest 11 fractions of 2 Gy each to total 22 Gy.     ALLERGIES:  is allergic to other, shellfish allergy, amoxicillin, and penicillins.  MEDICATIONS:  Current Outpatient Medications  Medication Sig Dispense Refill   albuterol (PROVENTIL) (2.5 MG/3ML) 0.083% nebulizer solution INHALE 1 VIAL VIA NEBULIZER EVERY 6 HOURS AS NEEDED FOR WHEEZING OR SHORTNESS OF BREATH 75 mL 12   albuterol (VENTOLIN HFA) 108 (90 Base) MCG/ACT inhaler Inhale 1-2 puffs into the lungs every 6 (six) hours as needed for wheezing or shortness of breath.     albuterol (VENTOLIN HFA) 108 (90 Base) MCG/ACT inhaler Inhale 2-4 puffs into the lungs every 4 (four) hours as needed for wheezing or shortness of breath. 18 g 0   allopurinol (ZYLOPRIM) 100 MG tablet Take 1 tablet (100  mg total) by mouth daily. 60 tablet 2   aspirin-acetaminophen-caffeine (EXCEDRIN MIGRAINE) 250-250-65 MG tablet Take 2 tablets by mouth every 6 (six) hours as needed for headache.     benzonatate (TESSALON) 100 MG capsule Take 1 capsule (100 mg total) by mouth 3 (three) times daily as needed for cough. 15 capsule 0   lidocaine (XYLOCAINE) 2 % solution Patient: Mix 1part 2% viscous lidocaine, 1part H20. Swallow 33mL of diluted mixture, 51min before meals and at bedtime, up to QID (Patient not taking: Reported on 12/21/2020) 200 mL 3   lidocaine-prilocaine (EMLA) cream APPLY TOPICALLY AS NEEDED AS DIRECTED 30 g 0   LORazepam (ATIVAN) 0.5 MG tablet Take 1 tablet, 30 min before radiotherapy, PRN anxiety. Do not drive after taking this medication. 10 tablet 0   predniSONE (DELTASONE) 10 MG tablet Take 4 tablets (40 mg total) by mouth daily. 16 tablet 0   sucralfate (CARAFATE) 1 g tablet Dissolve 1 tablet in 10 mL H20 and swallow 30 min prior to meals and bedtime. 40 tablet 2   No current facility-administered medications for this visit.   Facility-Administered Medications Ordered in Other Visits  Medication Dose Route Frequency Provider Last Rate Last Admin   sodium chloride flush (NS) 0.9 % injection 10 mL  10 mL Intracatheter PRN Curt Bears, MD   10 mL at 06/19/21 1533    SURGICAL HISTORY:  Past Surgical History:  Procedure Laterality Date   IR BONE  MARROW BIOPSY & ASPIRATION  06/21/2019   IR IMAGING GUIDED PORT INSERTION  06/21/2019   MASS BIOPSY Right 05/11/2019   Procedure: EXCISIONAL BIOPSY OF RIGHT CERVICAL LYMPH NODE;  Surgeon: Melida Quitter, MD;  Location: [place];  Service: ENT;  Laterality: Right;   TONSILLECTOMY      REVIEW OF SYSTEMS:  A comprehensive review of systems was negative.   PHYSICAL EXAMINATION: General appearance: alert, cooperative, and no distress Head: Normocephalic, without obvious abnormality, atraumatic Neck: no adenopathy, no JVD, supple, symmetrical,  trachea midline, and thyroid not enlarged, symmetric, no tenderness/mass/nodules Lymph nodes: Cervical, supraclavicular, and axillary nodes normal. Resp: clear to auscultation bilaterally Back: negative, no kyphosis present, symmetric, no curvature. ROM normal. No CVA tenderness. Cardio: regular rate and rhythm, S1, S2 normal, no murmur, click, rub or gallop GI: soft, non-tender; bowel sounds normal; no masses,  no organomegaly Extremities: extremities normal, atraumatic, no cyanosis or edema  ECOG PERFORMANCE STATUS: 0 - Asymptomatic  Blood pressure 109/68, pulse 78, temperature (!) 96.7 F (35.9 C), temperature source Tympanic, resp. rate 17, weight 195 lb (88.5 kg), SpO2 100 %.  LABORATORY DATA: Lab Results  Component Value Date   WBC 8.9 02/05/2021   HGB 13.2 02/05/2021   HCT 42.9 02/05/2021   MCV 93.3 02/05/2021   PLT 243 02/05/2021      Chemistry      Component Value Date/Time   NA 138 02/05/2021 1529   K 4.3 02/05/2021 1529   CL 107 02/05/2021 1529   CO2 21 (L) 02/05/2021 1529   BUN 14 02/05/2021 1529   CREATININE 0.64 02/05/2021 1529   CREATININE 0.66 12/19/2020 1117      Component Value Date/Time   CALCIUM 9.3 02/05/2021 1529   ALKPHOS 64 12/19/2020 1117   AST 22 12/19/2020 1117   ALT 21 12/19/2020 1117   BILITOT 0.5 12/19/2020 1117       RADIOGRAPHIC STUDIES: No results found.   ASSESSMENT AND PLAN: This is a very pleasant 30 years old African-American female recently diagnosed with a stage IIA Hodgkin lymphoma, nodular sclerosing subtype diagnosed in October 2020 and presented with right cervical and supraclavicular lymphadenopathy as well as right mediastinal lymphadenopathy.  The patient has no evidence of disease below the diaphragm. She does not have a bulky disease and she has no significant unfavorable risk factors. The patient underwent systemic chemotherapy with ABVD status post 2 cycles. This was followed by involved field radiation under the  care of Dr. Isidore Moos. She is currently on observation and feeling fine today with no concerning complaints. Repeat CBC today is unremarkable except for mild leukocytopenia secondary to administration of over-the-counter NSAIDs.  She was advised to discontinue NSAIDs and use Tylenol if needed. I recommended for the patient to continue on observation with repeat lab work as well as CT scan of the chest in 6 months. Will continue to have Port-A-Cath flush every 2 months and have the next scan showed no concerning findings for recurrence, we may consider removal of the Port-A-Cath. She was advised to call immediately if she has any concerning symptoms in the interval.  The patient voices understanding of current disease status and treatment options and is in agreement with the current care plan.  All questions were answered. The patient knows to call the clinic with any problems, questions or concerns. We can certainly see the patient much sooner if necessary.   Disclaimer: This note was dictated with voice recognition software. Similar sounding words can inadvertently be transcribed and  may not be corrected upon review.

## 2021-06-19 NOTE — Patient Instructions (Signed)
Steps to Quit Smoking Smoking tobacco is the leading cause of preventable death. It can affect almost every organ in the body. Smoking puts you and people around you at risk for many serious, long-lasting (chronic) diseases. Quitting smoking can be hard, but it is one of the best things that you can do for your health. It is never too late to quit. How do I get ready to quit? When you decide to quit smoking, make a plan to help you succeed. Before you quit: Pick a date to quit. Set a date within the next 2 weeks to give you time to prepare. Write down the reasons why you are quitting. Keep this list in places where you will see it often. Tell your family, friends, and co-workers that you are quitting. Their support is important. Talk with your doctor about the choices that may help you quit. Find out if your health insurance will pay for these treatments. Know the people, places, things, and activities that make you want to smoke (triggers). Avoid them. What first steps can I take to quit smoking? Throw away all cigarettes at home, at work, and in your car. Throw away the things that you use when you smoke, such as ashtrays and lighters. Clean your car. Make sure to empty the ashtray. Clean your home, including curtains and carpets. What can I do to help me quit smoking? Talk with your doctor about taking medicines and seeing a counselor at the same time. You are more likely to succeed when you do both. If you are pregnant or breastfeeding, talk with your doctor about counseling or other ways to quit smoking. Do not take medicine to help you quit smoking unless your doctor tells you to do so. To quit smoking: Quit right away Quit smoking totally, instead of slowly cutting back on how much you smoke over a period of time. Go to counseling. You are more likely to quit if you go to counseling sessions regularly. Take medicine You may take medicines to help you quit. Some medicines need a  prescription, and some you can buy over-the-counter. Some medicines may contain a drug called nicotine to replace the nicotine in cigarettes. Medicines may: Help you to stop having the desire to smoke (cravings). Help to stop the problems that come when you stop smoking (withdrawal symptoms). Your doctor may ask you to use: Nicotine patches, gum, or lozenges. Nicotine inhalers or sprays. Non-nicotine medicine that is taken by mouth. Find resources Find resources and other ways to help you quit smoking and remain smoke-free after you quit. These resources are most helpful when you use them often. They include: Online chats with a counselor. Phone quitlines. Printed self-help materials. Support groups or group counseling. Text messaging programs. Mobile phone apps. Use apps on your mobile phone or tablet that can help you stick to your quit plan. There are many free apps for mobile phones and tablets as well as websites. Examples include Quit Guide from the CDC and smokefree.gov  What things can I do to make it easier to quit?  Talk to your family and friends. Ask them to support and encourage you. Call a phone quitline (1-800-QUIT-NOW), reach out to support groups, or work with a counselor. Ask people who smoke to not smoke around you. Avoid places that make you want to smoke, such as: Bars. Parties. Smoke-break areas at work. Spend time with people who do not smoke. Lower the stress in your life. Stress can make you want to   smoke. Try these things to help your stress: Getting regular exercise. Doing deep-breathing exercises. Doing yoga. Meditating. Doing a body scan. To do this, close your eyes, focus on one area of your body at a time from head to toe. Notice which parts of your body are tense. Try to relax the muscles in those areas. How will I feel when I quit smoking? Day 1 to 3 weeks Within the first 24 hours, you may start to have some problems that come from quitting tobacco.  These problems are very bad 2-3 days after you quit, but they do not often last for more than 2-3 weeks. You may get these symptoms: Mood swings. Feeling restless, nervous, angry, or annoyed. Trouble concentrating. Dizziness. Strong desire for high-sugar foods and nicotine. Weight gain. Trouble pooping (constipation). Feeling like you may vomit (nausea). Coughing or a sore throat. Changes in how the medicines that you take for other issues work in your body. Depression. Trouble sleeping (insomnia). Week 3 and afterward After the first 2-3 weeks of quitting, you may start to notice more positive results, such as: Better sense of smell and taste. Less coughing and sore throat. Slower heart rate. Lower blood pressure. Clearer skin. Better breathing. Fewer sick days. Quitting smoking can be hard. Do not give up if you fail the first time. Some people need to try a few times before they succeed. Do your best to stick to your quit plan, and talk with your doctor if you have any questions or concerns. Summary Smoking tobacco is the leading cause of preventable death. Quitting smoking can be hard, but it is one of the best things that you can do for your health. When you decide to quit smoking, make a plan to help you succeed. Quit smoking right away, not slowly over a period of time. When you start quitting, seek help from your doctor, family, or friends. This information is not intended to replace advice given to you by your health care provider. Make sure you discuss any questions you have with your health care provider. Document Revised: 05/07/2019 Document Reviewed: 10/31/2018 Elsevier Patient Education  2022 Elsevier Inc.  

## 2021-06-20 ENCOUNTER — Telehealth: Payer: Self-pay | Admitting: Internal Medicine

## 2021-06-20 LAB — TSH: TSH: 1.965 u[IU]/mL (ref 0.308–3.960)

## 2021-06-20 NOTE — Telephone Encounter (Signed)
Scheduled follow-up appointments per 10/25 los. Patient is aware. 

## 2021-08-14 ENCOUNTER — Other Ambulatory Visit: Payer: Self-pay

## 2021-08-14 ENCOUNTER — Emergency Department (HOSPITAL_COMMUNITY)
Admission: EM | Admit: 2021-08-14 | Discharge: 2021-08-14 | Disposition: A | Discharge status: Home / Self Care | Payer: Medicaid Other | Attending: Emergency Medicine | Admitting: Emergency Medicine

## 2021-08-14 ENCOUNTER — Encounter (HOSPITAL_COMMUNITY): Payer: Self-pay | Admitting: Emergency Medicine

## 2021-08-14 DIAGNOSIS — Z20822 Contact with and (suspected) exposure to covid-19: Secondary | ICD-10-CM | POA: Insufficient documentation

## 2021-08-14 DIAGNOSIS — J45909 Unspecified asthma, uncomplicated: Secondary | ICD-10-CM | POA: Insufficient documentation

## 2021-08-14 DIAGNOSIS — Z8571 Personal history of Hodgkin lymphoma: Secondary | ICD-10-CM | POA: Insufficient documentation

## 2021-08-14 DIAGNOSIS — K529 Noninfective gastroenteritis and colitis, unspecified: Secondary | ICD-10-CM

## 2021-08-14 DIAGNOSIS — F17210 Nicotine dependence, cigarettes, uncomplicated: Secondary | ICD-10-CM | POA: Insufficient documentation

## 2021-08-14 DIAGNOSIS — R059 Cough, unspecified: Secondary | ICD-10-CM | POA: Insufficient documentation

## 2021-08-14 LAB — RESP PANEL BY RT-PCR (FLU A&B, COVID) ARPGX2
Influenza A by PCR: NEGATIVE
Influenza B by PCR: NEGATIVE
SARS Coronavirus 2 by RT PCR: NEGATIVE

## 2021-08-14 NOTE — ED Triage Notes (Signed)
Patient c/o cough and diarrhea x 3 days. States she has been taking mucinex and pepto. Reports yellow/brown mucus with cough. States coworker has flu. Raspy voice.

## 2021-08-14 NOTE — ED Provider Notes (Signed)
[facility] DEPT Provider Note   CSN: [id number] Arrival date & time: 08/14/21  1013     History Chief Complaint  Patient presents with   Cough   Diarrhea    Faith Pope is a 30 y.o. female.  30 year old female presents with 3 days of cough and diarrhea.  No emesis.  Has had positive flu exposures.  No chest or abdominal discomfort.  Using over-the-counter medication without relief.      Past Medical History:  Diagnosis Date   Asthma    Cancer ([facility])    Hodgkin Lymphoma diagnosed 04/2019   History of radiation therapy 09/08/19- 09/22/19   Right neck/ chest 11 fractions of 2 Gy each to total 22 Gy.     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, face or neck ([facility]) 09/20/2020   Port-A-Cath in place 08/04/2019   Goals of care, counseling/discussion 06/16/2019   Encounter for antineoplastic chemotherapy 06/16/2019   Hodgkin's lymphoma ([place]) 06/02/2019   Cigar smoker 06/02/2019    Past Surgical History:  Procedure Laterality Date   IR BONE MARROW BIOPSY & ASPIRATION  06/21/2019   IR IMAGING GUIDED PORT INSERTION  06/21/2019   MASS BIOPSY Right 05/11/2019   Procedure: EXCISIONAL BIOPSY OF RIGHT CERVICAL LYMPH NODE;  Surgeon: Melida Quitter, MD;  Location: [facility];  Service: ENT;  Laterality: Right;   TONSILLECTOMY       OB History   No obstetric history on file.     No family history on file.  Social History   Tobacco Use   Smoking status: Every Day    Packs/day: 1.00    Types: Cigars, Cigarettes   Smokeless tobacco: Never   Tobacco comments:    Per pt: 2-3 cigars daily (12/21/20)  Vaping Use   Vaping Use: Never used  Substance Use Topics   Alcohol use: Yes    Comment: rare on special occasions   Drug use: No    Home Medications Prior to Admission medications   Medication Sig Start Date End Date Taking? Authorizing Provider  albuterol (PROVENTIL) (2.5  MG/3ML) 0.083% nebulizer solution INHALE 1 VIAL VIA NEBULIZER EVERY 6 HOURS AS NEEDED FOR WHEEZING OR SHORTNESS OF BREATH 08/29/20 08/29/21  Long, Wonda Olds, MD  albuterol (VENTOLIN HFA) 108 (90 Base) MCG/ACT inhaler Inhale 1-2 puffs into the lungs every 6 (six) hours as needed for wheezing or shortness of breath.    [provider]  albuterol (VENTOLIN HFA) 108 (90 Base) MCG/ACT inhaler Inhale 2-4 puffs into the lungs every 4 (four) hours as needed for wheezing or shortness of breath. 02/03/21   Quintella Reichert, MD  allopurinol (ZYLOPRIM) 100 MG tablet Take 1 tablet (100 mg total) by mouth daily. 06/23/19   Heilingoetter, Cassandra L, PA-C  aspirin-acetaminophen-caffeine (EXCEDRIN MIGRAINE) 250-250-65 MG tablet Take 2 tablets by mouth every 6 (six) hours as needed for headache.    [provider]  benzonatate (TESSALON) 100 MG capsule Take 1 capsule (100 mg total) by mouth 3 (three) times daily as needed for cough. 02/03/21   Quintella Reichert, MD  lidocaine (XYLOCAINE) 2 % solution Patient: Mix 1part 2% viscous lidocaine, 1part H20. Swallow 66m of diluted mixture, 310m before meals and at bedtime, up to QID Patient not taking: Reported on 12/21/2020 09/20/19   SqEppie GibsonMD  LORazepam (ATIVAN) 0.5 MG tablet Take 1 tablet, 30 min before radiotherapy, PRN anxiety. Do not drive after taking  this medication. 09/08/19   Eppie Gibson, MD  predniSONE (DELTASONE) 10 MG tablet Take 4 tablets (40 mg total) by mouth daily. 02/03/21   Quintella Reichert, MD  sucralfate (CARAFATE) 1 g tablet Dissolve 1 tablet in 10 mL H20 and swallow 30 min prior to meals and bedtime. 09/13/19   Eppie Gibson, MD  omeprazole (PRILOSEC) 40 MG capsule Take 1 capsule (40 mg total) by mouth daily. Patient not taking: Reported on 08/30/2019 07/28/19 07/13/20  Harle Stanford., PA-C  prochlorperazine (COMPAZINE) 10 MG tablet Take 1 tablet (10 mg total) by mouth every 6 (six) hours as needed for nausea or vomiting. Patient not  taking: Reported on 08/30/2019 06/23/19 07/13/20  Heilingoetter, Cassandra L, PA-C    Allergies    Other, Shellfish allergy, Amoxicillin, and Penicillins  Review of Systems   Review of Systems  All other systems reviewed and are negative.  Physical Exam Updated Vital Signs BP 128/77    Pulse 97    Temp 98.3 F (36.8 C) (Oral)    Resp 20    Ht 1.575 m (5' 2" )    Wt 88.5 kg    LMP 08/08/2021    SpO2 99%    BMI 35.67 kg/m   Physical Exam Vitals and nursing note reviewed.  Constitutional:      General: She is not in acute distress.    Appearance: Normal appearance. She is well-developed. She is not toxic-appearing.  HENT:     Head: Normocephalic and atraumatic.  Eyes:     General: Lids are normal.     Conjunctiva/sclera: Conjunctivae normal.     Pupils: Pupils are equal, round, and reactive to light.  Neck:     Thyroid: No thyroid mass.     Trachea: No tracheal deviation.  Cardiovascular:     Rate and Rhythm: Normal rate and regular rhythm.     Heart sounds: Normal heart sounds. No murmur heard.   No gallop.  Pulmonary:     Effort: Pulmonary effort is normal. No respiratory distress.     Breath sounds: Normal breath sounds. No stridor. No decreased breath sounds, wheezing, rhonchi or rales.  Abdominal:     General: There is no distension.     Palpations: Abdomen is soft.     Tenderness: There is no abdominal tenderness. There is no rebound.  Musculoskeletal:        General: No tenderness. Normal range of motion.     Cervical back: Normal range of motion and neck supple.  Skin:    General: Skin is warm and dry.     Findings: No abrasion or rash.  Neurological:     Mental Status: She is alert and oriented to person, place, and time. Mental status is at baseline.     GCS: GCS eye subscore is 4. GCS verbal subscore is 5. GCS motor subscore is 6.     Cranial Nerves: No cranial nerve deficit.     Sensory: No sensory deficit.     Motor: Motor function is intact.  Psychiatric:         Attention and Perception: Attention normal.        Speech: Speech normal.        Behavior: Behavior normal.    ED Results / Procedures / Treatments   Labs (all labs ordered are listed, but only abnormal results are displayed) Labs Reviewed  RESP PANEL BY RT-PCR (FLU A&B, COVID) [id number]    EKG None  Radiology No results found.  Procedures  Procedures   Medications Ordered in ED Medications - No data to display  ED Course  I have reviewed the triage vital signs and the nursing notes.  Pertinent labs & imaging results that were available during my care of the patient were reviewed by me and considered in my medical decision making (see chart for details).    MDM Rules/Calculators/A&P                         COVID and flu test negative here.  Suspect viral gastroenteritis and will discharge home    Final Clinical Impression(s) / ED Diagnoses Final diagnoses:  None    Rx / DC Orders ED Discharge Orders     None        Lacretia Leigh, MD 08/14/21 1153

## 2021-08-22 ENCOUNTER — Inpatient Hospital Stay: Payer: Medicaid Other | Attending: Radiation Oncology

## 2021-08-22 ENCOUNTER — Other Ambulatory Visit: Payer: Self-pay

## 2021-08-22 DIAGNOSIS — Z95828 Presence of other vascular implants and grafts: Secondary | ICD-10-CM

## 2021-08-22 DIAGNOSIS — C8118 Nodular sclerosis classical Hodgkin lymphoma, lymph nodes of multiple sites: Secondary | ICD-10-CM | POA: Insufficient documentation

## 2021-08-22 DIAGNOSIS — Z452 Encounter for adjustment and management of vascular access device: Secondary | ICD-10-CM | POA: Insufficient documentation

## 2021-08-22 MED ORDER — HEPARIN SOD (PORK) LOCK FLUSH 100 UNIT/ML IV SOLN
500.0000 [IU] | Freq: Once | INTRAVENOUS | Status: AC | PRN
  Administered 2021-08-22: 16:00:00 500 [IU]

## 2021-08-22 MED ORDER — SODIUM CHLORIDE 0.9% FLUSH
10.0000 mL | INTRAVENOUS | Status: DC | PRN
  Administered 2021-08-22: 16:00:00 10 mL

## 2021-09-17 ENCOUNTER — Telehealth: Payer: Self-pay | Admitting: *Deleted

## 2021-09-17 ENCOUNTER — Other Ambulatory Visit: Payer: Self-pay

## 2021-09-17 DIAGNOSIS — Z1329 Encounter for screening for other suspected endocrine disorder: Secondary | ICD-10-CM

## 2021-09-17 NOTE — Telephone Encounter (Signed)
CALLED PATIENT TO ASK ABOUT COMING IN FOR A LAB ON 09-18-22 @ 1:30 PM PRIOR TO 2 PM FU WITH DR. Isidore Moos, SPOKE WITH MS. Wollard AND SHE AGREED TO DO SO

## 2021-09-18 ENCOUNTER — Other Ambulatory Visit: Payer: Self-pay

## 2021-09-18 ENCOUNTER — Telehealth: Payer: Self-pay | Admitting: *Deleted

## 2021-09-18 ENCOUNTER — Ambulatory Visit
Admission: RE | Admit: 2021-09-18 | Discharge: 2021-09-18 | Disposition: A | Discharge status: Home / Self Care | Payer: Self-pay | Source: Ambulatory Visit | Attending: Radiation Oncology | Admitting: Radiation Oncology

## 2021-09-18 ENCOUNTER — Other Ambulatory Visit (HOSPITAL_COMMUNITY): Payer: Self-pay

## 2021-09-18 ENCOUNTER — Encounter: Payer: Self-pay | Admitting: Internal Medicine

## 2021-09-18 ENCOUNTER — Ambulatory Visit
Admission: RE | Admit: 2021-09-18 | Discharge: 2021-09-18 | Disposition: A | Discharge status: Home / Self Care | Payer: Medicaid Other | Source: Ambulatory Visit | Attending: Radiation Oncology | Admitting: Radiation Oncology

## 2021-09-18 VITALS — BP 116/53 | HR 78 | Temp 97.6°F | Resp 20 | Ht 62.0 in | Wt 196.6 lb

## 2021-09-18 DIAGNOSIS — Z1329 Encounter for screening for other suspected endocrine disorder: Secondary | ICD-10-CM

## 2021-09-18 DIAGNOSIS — Z923 Personal history of irradiation: Secondary | ICD-10-CM | POA: Insufficient documentation

## 2021-09-18 DIAGNOSIS — C8111 Nodular sclerosis classical Hodgkin lymphoma, lymph nodes of head, face, and neck: Secondary | ICD-10-CM

## 2021-09-18 DIAGNOSIS — F17210 Nicotine dependence, cigarettes, uncomplicated: Secondary | ICD-10-CM | POA: Insufficient documentation

## 2021-09-18 DIAGNOSIS — Z8571 Personal history of Hodgkin lymphoma: Secondary | ICD-10-CM | POA: Insufficient documentation

## 2021-09-18 DIAGNOSIS — C8118 Nodular sclerosis classical Hodgkin lymphoma, lymph nodes of multiple sites: Secondary | ICD-10-CM

## 2021-09-18 LAB — TSH: TSH: 2.308 u[IU]/mL (ref 0.308–3.960)

## 2021-09-18 MED ORDER — NICOTINE 7 MG/24HR TD PT24
7.0000 mg | MEDICATED_PATCH | Freq: Every day | TRANSDERMAL | 0 refills | Status: DC
  Filled 2021-09-18: qty 14, 14d supply, fill #0

## 2021-09-18 MED ORDER — NICOTINE 14 MG/24HR TD PT24
14.0000 mg | MEDICATED_PATCH | Freq: Every day | TRANSDERMAL | 0 refills | Status: DC
  Filled 2021-09-18: qty 14, 14d supply, fill #0

## 2021-09-18 MED ORDER — NICOTINE 21 MG/24HR TD PT24
21.0000 mg | MEDICATED_PATCH | Freq: Every day | TRANSDERMAL | 2 refills | Status: DC
  Filled 2021-09-18 – 2021-10-03 (×2): qty 14, 14d supply, fill #0

## 2021-09-18 NOTE — Telephone Encounter (Signed)
Returned patient's phone call, spoke with patient 

## 2021-09-18 NOTE — Progress Notes (Signed)
Faith Pope presents for follow up of radiation completed on 09/22/2019 to her right neck/ chest area  Pain issues, if any: Patient denies Using a feeding tube?: N/A Weight changes, if any:  Wt Readings from Last 3 Encounters:  09/18/21 196 lb 9.6 oz (89.2 kg)  08/14/21 195 lb (88.5 kg)  06/19/21 195 lb (88.5 kg)   Swallowing issues, if any: Patient denies Cough/SOB: Patient denies any new or worsening symptoms Smoking or chewing tobacco? Reports smoking cigars occasionally  Using fluoride trays daily? 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 Melida Quitter and was told to F/U as needed Other notable issues, if any: Reports a swollen/tender area to her sternum that appeared about 2 hours ago (states she noticed it after she was leaning over while using the bathroom). States it has never occurred before. Started a new job ~2 months ago. Otherwise, denies any new issues or concerns

## 2021-09-19 ENCOUNTER — Other Ambulatory Visit: Payer: Self-pay

## 2021-09-19 ENCOUNTER — Encounter: Payer: Self-pay | Admitting: Radiation Oncology

## 2021-09-19 DIAGNOSIS — Z1329 Encounter for screening for other suspected endocrine disorder: Secondary | ICD-10-CM

## 2021-09-19 NOTE — Progress Notes (Signed)
Radiation Oncology         [phone] ________________________________  Name: Faith Pope MRN: [id number]  Date: 09/18/2021  DOB: Nov 04, 1990  Follow-Up Visit Note  Outpatient  CC: Pcp, No  Curt Bears, MD  Diagnosis and Prior Radiotherapy:    ICD-10-CM   1. 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 Allender presents for follow up of radiation completed on 09/22/2019 to her right neck/ chest area  Pain issues, if any: Patient denies Using a feeding tube?: N/A Weight changes, if any:  Wt Readings from Last 3 Encounters:  09/18/21 196 lb 9.6 oz (89.2 kg)  08/14/21 195 lb (88.5 kg)  06/19/21 195 lb (88.5 kg)   Swallowing issues, if any: Patient denies Cough/SOB: Patient denies any new or worsening symptoms Smoking or chewing tobacco? Reports smoking cigars often - high nicotine content Black and Whites -  at least 10 per day. Using fluoride trays daily? 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 Melida Quitter and was told to F/U as needed Other notable issues, if any: Reports a swollen/tender area to her sternum that appeared about 2 hours ago (states she noticed it after she was leaning over while using the bathroom). States it has never occurred before. Started a new job ~2 months ago. Otherwise, denies any new issues or  concerns   ALLERGIES:  is allergic to other, shellfish allergy, amoxicillin, and penicillins.  Meds: Current Outpatient Medications  Medication Sig Dispense Refill   nicotine (NICODERM CQ - DOSED IN MG/24 HOURS) 14 mg/24hr patch Place 1 patch (14 mg total) onto the skin daily. Apply 21 mg patch daily for 6 weeks, then appy the 14mg  patch daily for 2 weeks, then apply the 7 mg patch daily for 2 weeks. 14 patch 0   nicotine (NICODERM CQ - DOSED IN MG/24 HOURS) 21 mg/24hr patch Place 1 patch (21 mg total) onto the skin daily. Apply 21 mg patch daily for 6 weeks, then appy the 14mg  patch daily for 2 weeks, then apply the 7 mg patch daily for 2 weeks. 14 patch 2   nicotine (NICODERM CQ - DOSED IN MG/24 HR) 7 mg/24hr patch Place 1 patch (7 mg total) onto the skin daily. **Apply 21 mg patch daily for 6 weeks, then appy the 14mg  patch daily for 2 weeks, then apply the 7 mg patch daily for 2 weeks.** 14 patch 0   albuterol (PROVENTIL) (2.5 MG/3ML) 0.083% nebulizer solution INHALE 1 VIAL VIA NEBULIZER EVERY 6 HOURS AS NEEDED FOR WHEEZING OR SHORTNESS OF BREATH 75 mL 12   albuterol (VENTOLIN HFA) 108 (90 Base) MCG/ACT inhaler Inhale 1-2 puffs into the lungs every 6 (six) hours as needed for wheezing or shortness of breath.     albuterol (VENTOLIN HFA) 108 (90  Base) MCG/ACT inhaler Inhale 2-4 puffs into the lungs every 4 (four) hours as needed for wheezing or shortness of breath. 18 g 0   aspirin-acetaminophen-caffeine (EXCEDRIN MIGRAINE) 161-096-04 MG tablet Take 2 tablets by mouth every 6 (six) hours as needed for headache.     No current facility-administered medications for this encounter.    Physical Findings: The patient is in no acute distress. Patient is alert and oriented.  height is 5\' 2"  (1.575 m) and weight is 196 lb 9.6 oz (89.2 kg). Her temperature is 97.6 F (36.4 C). Her blood pressure is 116/53 (abnormal) and her pulse is 78. Her respiration is 20 and oxygen saturation is 100%. Marland Kitchen    NECK:  No palpable adenopathy in the cervical or supraclavicular neck Heart: Regular in rate and rhythm Chest clear to auscultation bilaterally Breasts: No palpable masses in the breasts or axillary regions bilaterally Skin: Satisfactory healing of skin in the radiation treatment fields MSK: no masses in sternal region Abd: soft, NT/ND  Psych: affect is appropriate, alert an oriented. Insight intact.  Lab Findings: Lab Results  Component Value Date   WBC 3.5 (L) 06/19/2021   HGB 12.3 06/19/2021   HCT 36.4 06/19/2021   MCV 85.0 06/19/2021   PLT 285 06/19/2021    Radiographic Findings: No results found.   Impression/Plan: No evidence of disease on physical exam.  She has been followed with posttreatment CT imaging in medical oncology.     TSH was obtained today.  It is within normal limits. Lab Results  Component Value Date   TSH 2.308 09/18/2021   I will see her back in a year with TSH testing and physical exam.  She will continue to follow closely with medical oncology.  Again, we discussed that she will need to start surveillance for breast cancer sooner than most women given her radiation history (ie start in her mid 22s).  However this can be postponed for several years. Fortunately she was treated to a relatively low dose of radiation and most of her breast tissue is outside of the fields (with exception to portions of the medial breasts).   I asked the patient today about tobacco use. The patient uses tobacco.  I advised the patient to quit. Services were offered by me today including outpatient counseling and pharmacotherapy. I assessed for the willingness to attempt to quit and provided encouragement and demonstrated willingness to make referrals and/or prescriptions to help the patient attempt to quit. The patient has follow-up with the oncologic team to touch base on their tobacco use and /or cessation efforts.  Over 5 minutes were spent on this issue. Nicotine Patches Rx'd. She  plans to quit March 1. [place] hotline written down for her - she plans to call it for support.   On date of service, in total, I spent 30 minutes on this encounter; she was seen in person _____________________________________   Eppie Gibson, MD

## 2021-09-26 ENCOUNTER — Other Ambulatory Visit (HOSPITAL_COMMUNITY): Payer: Self-pay

## 2021-10-02 ENCOUNTER — Encounter (HOSPITAL_COMMUNITY): Payer: Self-pay

## 2021-10-02 ENCOUNTER — Other Ambulatory Visit: Payer: Self-pay

## 2021-10-02 ENCOUNTER — Emergency Department (HOSPITAL_COMMUNITY)
Admission: EM | Admit: 2021-10-02 | Discharge: 2021-10-02 | Disposition: A | Discharge status: Home / Self Care | Payer: Self-pay | Attending: Emergency Medicine | Admitting: Emergency Medicine

## 2021-10-02 ENCOUNTER — Emergency Department (HOSPITAL_COMMUNITY): Payer: Self-pay

## 2021-10-02 DIAGNOSIS — L02612 Cutaneous abscess of left foot: Secondary | ICD-10-CM | POA: Insufficient documentation

## 2021-10-02 DIAGNOSIS — Z79899 Other long term (current) drug therapy: Secondary | ICD-10-CM | POA: Insufficient documentation

## 2021-10-02 DIAGNOSIS — L02619 Cutaneous abscess of unspecified foot: Secondary | ICD-10-CM

## 2021-10-02 MED ORDER — DOXYCYCLINE HYCLATE 100 MG PO TABS
100.0000 mg | ORAL_TABLET | Freq: Once | ORAL | Status: AC
  Administered 2021-10-02: 100 mg via ORAL
  Filled 2021-10-02: qty 1

## 2021-10-02 MED ORDER — DOXYCYCLINE HYCLATE 100 MG PO CAPS
100.0000 mg | ORAL_CAPSULE | Freq: Two times a day (BID) | ORAL | 0 refills | Status: AC
  Filled 2021-10-02: qty 14, 7d supply, fill #0

## 2021-10-02 NOTE — Discharge Instructions (Addendum)
I recommend following up with a podiatrist or foot doctor/specialist. You may have underlying athlete's foot also however currently the goal is to try and treat the infection. Please take a picture of your wound every day.  Please keep it clean and dry. If you develop fevers, or have any new or concerning symptoms please seek additional medical care and evaluation.  Please take Ibuprofen (Advil, motrin) and Tylenol (acetaminophen) to relieve your pain.    You may take up to 600 MG (3 pills) of normal strength ibuprofen every 8 hours as needed.   You make take tylenol, up to 1,000 mg (two extra strength pills) every 8 hours as needed.   It is safe to take ibuprofen and tylenol at the same time as they work differently.   Do not take more than 3,000 mg tylenol in a 24 hour period (not more than one dose every 8 hours.  Please check all medication labels as many medications such as pain and cold medications may contain tylenol.  Do not drink alcohol while taking these medications.  Do not take other NSAID'S while taking ibuprofen (such as aleve or naproxen).  Please take ibuprofen with food to decrease stomach upset.  You may have diarrhea from the antibiotics.  It is very important that you continue to take the antibiotics even if you get diarrhea unless a medical professional tells you that you may stop taking them.  If you stop too early the bacteria you are being treated for will become stronger and you may need different, more powerful antibiotics that have more side effects and worsening diarrhea.  Please stay well hydrated and consider probiotics as they may decrease the severity of your diarrhea.  Please be aware that if you take any hormonal contraception (birth control pills, nexplanon, the ring, etc) that your birth control will not work while you are taking antibiotics and you need to use back up protection as directed on the birth control medication information insert.   While in the ED your  blood pressure was high.  Please follow up with your primary care doctor or the wellness clinic for repeat evaluation as you may need medication.  High blood pressure can cause long term, potentially serious, damage if left untreated.

## 2021-10-02 NOTE — ED Provider Triage Note (Signed)
Emergency Medicine Provider Triage Evaluation Note  Faith Pope , a 31 y.o. female  was evaluated in triage.  Pt complains of left foot pain x3 days. She noticed a small spot on the bottom of her foot on Saturday that has grown in size. She admits to clear drainage from area. No fever or chills. No known injury.   Review of Systems  Positive: Foot pain Negative: fever  Physical Exam  BP (!) 160/112 (BP Location: Left Arm)    Pulse 94    Temp 98 F (36.7 C) (Oral)    Resp 18    Ht 5\' 2"  (1.575 m)    Wt 90.7 kg    SpO2 99%    BMI 36.58 kg/m  Gen:   Awake, no distress   Resp:  Normal effort  MSK:   Moves extremities without difficulty  Other:    Medical Decision Making  Medically screening exam initiated at 5:22 PM.  Appropriate orders placed.  Faith Pope was informed that the remainder of the evaluation will be completed by another provider, this initial triage assessment does not replace that evaluation, and the importance of remaining in the ED until their evaluation is complete.  Zenaida Deed, PA-C 10/02/21 1725

## 2021-10-02 NOTE — ED Triage Notes (Signed)
"  Spot of skin on bottom of left foot came up Saturday and was painful, I put some wart cream on it and since then it has gotten bigger and more painful" per pt

## 2021-10-02 NOTE — ED Provider Notes (Signed)
[facility] DEPT Provider Note   CSN: [id number] Arrival date & time: 10/02/21  1651     History  Chief Complaint  Patient presents with   Foot Pain    Faith Pope is a 31 y.o. female who presents today for evaluation of foot pain. She states that she noted a small area on her foot that looked abnormal, she thought it might be a wart so she started putting wart remover on it.  3 days ago she states that she picked at it with tweezers and with her nails, and since then it has grown significantly in size.  She states that it hurts when she walks and she feels like she has a ball there.  She denies any fevers, otherwise feels well. She states that she is normally on her feet for extended hours at work.  HPI     Home Medications Prior to Admission medications   Medication Sig Start Date End Date Taking? Authorizing Provider  doxycycline (VIBRAMYCIN) 100 MG capsule Take 1 capsule (100 mg total) by mouth 2 (two) times daily for 7 days. 10/02/21 10/09/21 Yes Lorin Glass, PA-C  albuterol (PROVENTIL) (2.5 MG/3ML) 0.083% nebulizer solution INHALE 1 VIAL VIA NEBULIZER EVERY 6 HOURS AS NEEDED FOR WHEEZING OR SHORTNESS OF BREATH 08/29/20 08/29/21  Long, Wonda Olds, MD  albuterol (VENTOLIN HFA) 108 (90 Base) MCG/ACT inhaler Inhale 1-2 puffs into the lungs every 6 (six) hours as needed for wheezing or shortness of breath.    [provider]  albuterol (VENTOLIN HFA) 108 (90 Base) MCG/ACT inhaler Inhale 2-4 puffs into the lungs every 4 (four) hours as needed for wheezing or shortness of breath. 02/03/21   Quintella Reichert, MD  aspirin-acetaminophen-caffeine (EXCEDRIN MIGRAINE) 4317034513 MG tablet Take 2 tablets by mouth every 6 (six) hours as needed for headache.    [provider]  nicotine (NICODERM CQ - DOSED IN MG/24 HOURS) 14 mg/24hr patch Place 1 patch (14 mg total) onto the skin daily. Apply 21 mg patch daily for 6 weeks, then appy the 14mg  patch  daily for 2 weeks, then apply the 7 mg patch daily for 2 weeks. 09/18/21   Eppie Gibson, MD  nicotine (NICODERM CQ - DOSED IN MG/24 HOURS) 21 mg/24hr patch Place 1 patch (21 mg total) onto the skin daily. Apply 21 mg patch daily for 6 weeks, then appy the 14mg  patch daily for 2 weeks, then apply the 7 mg patch daily for 2 weeks. 09/18/21   Eppie Gibson, MD  nicotine (NICODERM CQ - DOSED IN MG/24 HR) 7 mg/24hr patch Place 1 patch (7 mg total) onto the skin daily. **Apply 21 mg patch daily for 6 weeks, then appy the 14mg  patch daily for 2 weeks, then apply the 7 mg patch daily for 2 weeks.** 09/18/21   Eppie Gibson, MD  omeprazole (PRILOSEC) 40 MG capsule Take 1 capsule (40 mg total) by mouth daily. Patient not taking: Reported on 08/30/2019 07/28/19 07/13/20  Harle Stanford., PA-C  prochlorperazine (COMPAZINE) 10 MG tablet Take 1 tablet (10 mg total) by mouth every 6 (six) hours as needed for nausea or vomiting. Patient not taking: Reported on 08/30/2019 06/23/19 07/13/20  Heilingoetter, Cassandra L, PA-C      Allergies    Other, Shellfish allergy, Amoxicillin, and Penicillins      Physical Exam Updated Vital Signs BP (!) 160/112 (BP Location: Left Arm)    Pulse 94    Temp 98 F (36.7 C) (Oral)  Resp 18    Ht 5\' 2"  (1.575 m)    Wt 90.7 kg    SpO2 99%    BMI 36.58 kg/m  Physical Exam Vitals and nursing note reviewed.  Constitutional:      General: She is not in acute distress.    Appearance: She is not ill-appearing.  HENT:     Head: Normocephalic.  Cardiovascular:     Rate and Rhythm: Normal rate.     Comments: Toes on bilateral feet are warm and well-perfused.  Brisk capillary refill to toes on bilateral feet. Pulmonary:     Effort: Pulmonary effort is normal. No respiratory distress.  Skin:    Comments: Please see clinical image.  There is approximately a 4 cm area of blanched skin with palpable fluid collection underneath.  They do blanch skin this appears to be purulent in nature.  On  feet bilaterally her skin in general general have scales/cracking especially in between the toes.   Neurological:     Mental Status: She is alert.     Sensory: No sensory deficit.       ED Results / Procedures / Treatments   Labs (all labs ordered are listed, but only abnormal results are displayed) Labs Reviewed - No data to display  EKG None  Radiology DG Foot Complete Left  Result Date: 10/02/2021 CLINICAL DATA:  Painful skin lesion on the bottom of the left foot since Saturday. EXAM: LEFT FOOT - COMPLETE 3+ VIEW COMPARISON:  None. FINDINGS: There is no evidence of fracture or dislocation. There is no evidence of arthropathy or other focal bone abnormality. Soft tissues are unremarkable. IMPRESSION: Negative. Electronically Signed   By: Lucienne Capers M.D.   On: 10/02/2021 18:03    Procedures .Marland KitchenIncision and Drainage  Date/Time: 10/02/2021 9:23 PM Performed by: Lorin Glass, PA-C Authorized by: Lorin Glass, PA-C   Consent:    Consent obtained:  Verbal   Consent given by:  Patient   Risks, benefits, and alternatives were discussed: yes     Risks discussed:  Bleeding, incomplete drainage, pain and infection (Damage to other structures, need for additional procedures)   Alternatives discussed:  No treatment, alternative treatment and referral Universal protocol:    Procedure explained and questions answered to patient or proxy's satisfaction: yes   Location:    Type:  Abscess   Size:  4cm   Location:  Lower extremity   Lower extremity location:  Foot   Foot location:  L foot Pre-procedure details:    Skin preparation:  Chloraprep Sedation:    Sedation type:  None Anesthesia:    Anesthesia method:  None (Prior to incision department skin was checked and was insensate.) Procedure type:    Complexity:  Simple Procedure details:    Incision types:  Stab incision   Incision depth:  Subcutaneous   Drainage:  Purulent   Drainage amount:  Scant   Wound  treatment: Approximately 1 cm x 5 mm area of devitalized skin was removed to facilitate drainage.   Packing materials:  None Post-procedure details:    Procedure completion:  Tolerated well, no immediate complications Comments:     There was a scant amount of purulent/cloudy material consistent with infection.  Only the superficial layer of skin was removed in a small band to allow for ongoing drainage.      Medications Ordered in ED Medications  doxycycline (VIBRA-TABS) tablet 100 mg (100 mg Oral Given 10/02/21 2117)    ED Course/ Medical  Decision Making/ A&P                           Medical Decision Making Risk Prescription drug management.   This patient presents to the ED for concern of foot pain and infection, this involves an extensive number of treatment options, and is a complaint that carries with it a high risk of complications and morbidity.  The differential diagnosis includes fluid collection, abscess,    Co morbidities that complicate the patient evaluation  Hodgkin's lymphoma in remission   Additional history obtained:  Additional history obtained from N/A External records from outside source obtained and reviewed including N/A   Lab Tests:  Labs are considered, however as patient is afebrile, not tachycardic or tachypneic generally well-appearing without systemic symptoms do not appear to be indicated at this time   Imaging Studies ordered:  I ordered imaging studies including foot x-ray I independently visualized and interpreted imaging which showed no acute fracture or abnormality, there does appear to be a questionable lucency in the area of the wound consistent with a fluid collection.     Medicines ordered and prescription drug management:  I ordered medication including doxycycline for infection.  As patient did not have any penetrating injury through her shoe she does not appear to require Pseudomonas coverage at this time. Reevaluation of the  patient after these medicines showed that the patient stayed the same I have reviewed the patients home medicines and have made adjustments as needed    Critical Interventions:  Incision and drainage    Problem List / ED Course:  Foot pain   Reevaluation:  After the interventions noted above, I reevaluated the patient and found that they have :stayed the same   Social Determinants of Health:  No PCP   Dispostion:  After consideration of the diagnostic results and the patients response to treatment, I feel that the patent would benefit from discharge home with close outpatient follow-up. I recommended that she follow-up with PCP or a podiatrist or back in the ER sooner if she has any concerns.  Crutches and work note given.  Wound care discussed.    Return precautions were discussed with patient who states their understanding.  At the time of discharge patient denied any unaddressed complaints or concerns.  Patient is agreeable for discharge home.  Note: Portions of this report may have been transcribed using voice recognition software. Every effort was made to ensure accuracy; however, inadvertent computerized transcription errors may be present    Final Clinical Impression(s) / ED Diagnoses Final diagnoses:  Foot abscess    Rx / DC Orders ED Discharge Orders          Ordered    doxycycline (VIBRAMYCIN) 100 MG capsule  2 times daily        10/02/21 2109              Lorin Glass, Hershal Coria 10/02/21 2128    Wynona Dove A, DO 10/03/21 0128

## 2021-10-03 ENCOUNTER — Other Ambulatory Visit (HOSPITAL_COMMUNITY): Payer: Self-pay

## 2021-10-03 ENCOUNTER — Encounter: Payer: Self-pay | Admitting: Internal Medicine

## 2021-10-09 ENCOUNTER — Encounter: Payer: Self-pay | Admitting: Podiatry

## 2021-10-09 ENCOUNTER — Ambulatory Visit (INDEPENDENT_AMBULATORY_CARE_PROVIDER_SITE_OTHER): Payer: Self-pay

## 2021-10-09 ENCOUNTER — Other Ambulatory Visit: Payer: Self-pay

## 2021-10-09 ENCOUNTER — Ambulatory Visit (INDEPENDENT_AMBULATORY_CARE_PROVIDER_SITE_OTHER): Payer: Self-pay | Admitting: Podiatry

## 2021-10-09 ENCOUNTER — Other Ambulatory Visit (HOSPITAL_COMMUNITY): Payer: Self-pay

## 2021-10-09 DIAGNOSIS — L0291 Cutaneous abscess, unspecified: Secondary | ICD-10-CM

## 2021-10-09 DIAGNOSIS — B353 Tinea pedis: Secondary | ICD-10-CM

## 2021-10-09 MED ORDER — CLOTRIMAZOLE-BETAMETHASONE 1-0.05 % EX CREA
1.0000 | TOPICAL_CREAM | Freq: Two times a day (BID) | CUTANEOUS | 0 refills | Status: AC
Start: 2021-10-09 — End: ?
  Filled 2021-10-09: qty 30, 15d supply, fill #0

## 2021-10-09 NOTE — Progress Notes (Signed)
°  Subjective:  Patient ID: Faith Pope, female    DOB: Jun 03, 1991,   MRN: [id number]  Chief Complaint  Patient presents with   Foot Ulcer    L foot - abcess     31 y.o. female presents for follow-up of left foot abscess. Patient relates she had a wart she believes and was treating with OTC wart medication and then developed and abscess. Was seen in the ED were the preformed I&D. Here to follow after that. Relates it is doing well and not having any pain. Does have some concern over the dryness in her feet.  . Denies any other pedal complaints. Denies n/v/f/c.   Past Medical History:  Diagnosis Date   Asthma    Cancer ([facility])    Hodgkin Lymphoma diagnosed 04/2019   History of radiation therapy 09/08/19- 09/22/19   Right neck/ chest 11 fractions of 2 Gy each to total 22 Gy.     Objective:  Physical Exam: Vascular: DP/PT pulses 2/4 bilateral. CFT <3 seconds. Normal hair growth on digits. No edema.  Skin. No lacerations or abrasions bilateral feet. Hyperkeratotic tissue noted to plantar mid foot on the left. Area of abscess is well healed and no signs of infection noted. There is xerosis noted to bilateral plantar feet circumferentially.  Musculoskeletal: MMT 5/5 bilateral lower extremities in DF, PF, Inversion and Eversion. Deceased ROM in DF of ankle joint.  Neurological: Sensation intact to light touch.   Assessment:   1. Abscess   2. Tinea pedis of both feet      Plan:  Patient was evaluated and treated and all questions answered. Discussed abscess and warts with patient.  Her foot appears to have healed well from previous I&D.  Discussed tinea pedis and dermatitis and treatment options with patient.  Lotrisone sent to pharmacy  Patient to follow-up as needed if she continues to have trouble.   Lorenda Peck, DPM

## 2021-10-10 ENCOUNTER — Encounter: Payer: Self-pay | Admitting: Internal Medicine

## 2021-10-10 ENCOUNTER — Other Ambulatory Visit (HOSPITAL_COMMUNITY): Payer: Self-pay

## 2021-10-11 ENCOUNTER — Other Ambulatory Visit (HOSPITAL_COMMUNITY): Payer: Self-pay

## 2021-10-15 ENCOUNTER — Other Ambulatory Visit (HOSPITAL_COMMUNITY): Payer: Self-pay

## 2021-10-23 ENCOUNTER — Other Ambulatory Visit: Payer: Self-pay | Admitting: Internal Medicine

## 2021-10-23 ENCOUNTER — Other Ambulatory Visit (HOSPITAL_COMMUNITY): Payer: Self-pay

## 2021-10-23 ENCOUNTER — Inpatient Hospital Stay: Payer: Self-pay | Attending: Radiation Oncology

## 2021-10-23 ENCOUNTER — Other Ambulatory Visit: Payer: Self-pay

## 2021-10-23 ENCOUNTER — Encounter: Payer: Self-pay | Admitting: Internal Medicine

## 2021-10-23 DIAGNOSIS — C8111 Nodular sclerosis classical Hodgkin lymphoma, lymph nodes of head, face, and neck: Secondary | ICD-10-CM

## 2021-10-23 DIAGNOSIS — C8118 Nodular sclerosis classical Hodgkin lymphoma, lymph nodes of multiple sites: Secondary | ICD-10-CM | POA: Insufficient documentation

## 2021-10-23 DIAGNOSIS — Z95828 Presence of other vascular implants and grafts: Secondary | ICD-10-CM

## 2021-10-23 DIAGNOSIS — Z452 Encounter for adjustment and management of vascular access device: Secondary | ICD-10-CM | POA: Insufficient documentation

## 2021-10-23 MED ORDER — SODIUM CHLORIDE 0.9% FLUSH
10.0000 mL | INTRAVENOUS | Status: DC | PRN
  Administered 2021-10-23: 10 mL

## 2021-10-23 MED ORDER — LIDOCAINE-PRILOCAINE 2.5-2.5 % EX CREA
TOPICAL_CREAM | CUTANEOUS | 0 refills | Status: AC
  Filled 2021-10-23: qty 30, 30d supply, fill #0

## 2021-10-23 MED ORDER — HEPARIN SOD (PORK) LOCK FLUSH 100 UNIT/ML IV SOLN
500.0000 [IU] | Freq: Once | INTRAVENOUS | Status: AC | PRN
  Administered 2021-10-23: 500 [IU]

## 2021-12-14 ENCOUNTER — Ambulatory Visit (HOSPITAL_COMMUNITY)
Admission: RE | Admit: 2021-12-14 | Discharge: 2021-12-14 | Disposition: A | Discharge status: Home / Self Care | Payer: Self-pay | Source: Ambulatory Visit | Attending: Internal Medicine | Admitting: Internal Medicine

## 2021-12-14 ENCOUNTER — Other Ambulatory Visit: Payer: Self-pay

## 2021-12-14 ENCOUNTER — Inpatient Hospital Stay: Payer: Medicaid Other | Attending: Radiation Oncology

## 2021-12-14 DIAGNOSIS — C8111 Nodular sclerosis classical Hodgkin lymphoma, lymph nodes of head, face, and neck: Secondary | ICD-10-CM | POA: Insufficient documentation

## 2021-12-14 DIAGNOSIS — Z95828 Presence of other vascular implants and grafts: Secondary | ICD-10-CM

## 2021-12-14 DIAGNOSIS — C8118 Nodular sclerosis classical Hodgkin lymphoma, lymph nodes of multiple sites: Secondary | ICD-10-CM | POA: Insufficient documentation

## 2021-12-14 LAB — CMP (CANCER CENTER ONLY)
ALT: 16 U/L (ref 0–44)
AST: 18 U/L (ref 15–41)
Albumin: 4.1 g/dL (ref 3.5–5.0)
Alkaline Phosphatase: 51 U/L (ref 38–126)
Anion gap: 7 (ref 5–15)
BUN: 19 mg/dL (ref 6–20)
CO2: 23 mmol/L (ref 22–32)
Calcium: 8.9 mg/dL (ref 8.9–10.3)
Chloride: 108 mmol/L (ref 98–111)
Creatinine: 0.69 mg/dL (ref 0.44–1.00)
GFR, Estimated: >60 mL/min (ref >60)
Glucose, Bld: 91 mg/dL (ref 70–99)
Potassium: 3.8 mmol/L (ref 3.5–5.1)
Sodium: 138 mmol/L (ref 135–145)
Total Bilirubin: 0.3 mg/dL (ref 0.3–1.2)
Total Protein: 6.9 g/dL (ref 6.5–8.1)

## 2021-12-14 LAB — CBC WITH DIFFERENTIAL (CANCER CENTER ONLY)
Abs Immature Granulocytes: 0.01 10*3/uL (ref 0.00–0.07)
Basophils Absolute: 0 10*3/uL (ref 0.0–0.1)
Basophils Relative: 0 %
Eosinophils Absolute: 0.1 10*3/uL (ref 0.0–0.5)
Eosinophils Relative: 1 %
HCT: 34.3 % — ABNORMAL LOW (ref 36.0–46.0)
Hemoglobin: 11.5 g/dL — ABNORMAL LOW (ref 12.0–15.0)
Immature Granulocytes: 0 %
Lymphocytes Relative: 38 %
Lymphs Abs: 2.6 10*3/uL (ref 0.7–4.0)
MCH: 28.8 pg (ref 26.0–34.0)
MCHC: 33.5 g/dL (ref 30.0–36.0)
MCV: 86 fL (ref 80.0–100.0)
Monocytes Absolute: 0.6 10*3/uL (ref 0.1–1.0)
Monocytes Relative: 8 %
Neutro Abs: 3.6 10*3/uL (ref 1.7–7.7)
Neutrophils Relative %: 53 %
Platelet Count: 255 10*3/uL (ref 150–400)
RBC: 3.99 MIL/uL (ref 3.87–5.11)
RDW: 14.4 % (ref 11.5–15.5)
WBC Count: 6.8 10*3/uL (ref 4.0–10.5)
nRBC: 0 % (ref 0.0–0.2)

## 2021-12-14 LAB — LACTATE DEHYDROGENASE: LDH: 166 U/L (ref 98–192)

## 2021-12-14 MED ORDER — HEPARIN SOD (PORK) LOCK FLUSH 100 UNIT/ML IV SOLN
500.0000 [IU] | Freq: Once | INTRAVENOUS | Status: AC
  Administered 2021-12-14: 500 [IU] via INTRAVENOUS

## 2021-12-14 MED ORDER — SODIUM CHLORIDE 0.9% FLUSH
10.0000 mL | INTRAVENOUS | Status: DC | PRN
  Administered 2021-12-14: 10 mL

## 2021-12-14 MED ORDER — IOHEXOL 300 MG/ML  SOLN
75.0000 mL | Freq: Once | INTRAMUSCULAR | Status: AC | PRN
  Administered 2021-12-14: 75 mL via INTRAVENOUS

## 2021-12-14 MED ORDER — HEPARIN SOD (PORK) LOCK FLUSH 100 UNIT/ML IV SOLN
INTRAVENOUS | Status: AC
  Filled 2021-12-14: qty 5

## 2021-12-17 ENCOUNTER — Encounter: Payer: Self-pay | Admitting: Internal Medicine

## 2021-12-17 ENCOUNTER — Other Ambulatory Visit: Payer: Self-pay

## 2021-12-17 ENCOUNTER — Inpatient Hospital Stay (HOSPITAL_BASED_OUTPATIENT_CLINIC_OR_DEPARTMENT_OTHER): Payer: Self-pay | Admitting: Internal Medicine

## 2021-12-17 VITALS — BP 114/81 | HR 85 | Temp 97.3°F | Resp 17 | Wt 191.6 lb

## 2021-12-17 DIAGNOSIS — C8111 Nodular sclerosis classical Hodgkin lymphoma, lymph nodes of head, face, and neck: Secondary | ICD-10-CM

## 2021-12-17 NOTE — Progress Notes (Signed)
?    Faith Pope ?[phone]   [phone] ? ?OFFICE PROGRESS NOTE ? ?Pcp, No ?No address on file ? 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 ? ?PRIOR THERAPY:   ?1) Systemic chemotherapy with ABVD on days 1 and 15 every 4 weeks.  First dose June 23, 2019.  She is status post 2 cycles.  ?2) involved field radiotherapy under the care of Dr. Isidore Moos ? ?CURRENT THERAPY: Observation. ? ?INTERVAL HISTORY: ?Faith Pope 31 y.o. female returns to the clinic today for 19-monthfollow-up visit.  The patient is feeling fine today with no concerning complaints.  She has no chest pain, shortness of breath, cough or hemoptysis.  The patient has no nausea, vomiting, diarrhea or constipation.  She has no recent weight loss or night sweats.  She has no palpable lymphadenopathy.  She had repeat CT scan of the chest performed recently and she is here for evaluation and discussion of her scan results. ? ?MEDICAL HISTORY: ?Past Medical History:  ?Diagnosis Date  ? Asthma   ? Cancer ([facility]   ? Hodgkin Lymphoma diagnosed 04/2019  ? History of radiation therapy 09/08/19- 09/22/19  ? Right neck/ chest 11 fractions of 2 Gy each to total 22 Gy.   ? ? ?ALLERGIES:  is allergic to other, shellfish allergy, amoxicillin, and penicillins. ? ?MEDICATIONS:  ?Current Outpatient Medications  ?Medication Sig Dispense Refill  ? albuterol (PROVENTIL) (2.5 MG/3ML) 0.083% nebulizer solution INHALE 1 VIAL VIA NEBULIZER EVERY 6 HOURS AS NEEDED FOR WHEEZING OR SHORTNESS OF BREATH 75 mL 12  ? albuterol (VENTOLIN HFA) 108 (90 Base) MCG/ACT inhaler Inhale 1-2 puffs into the lungs every 6 (six) hours as needed for wheezing or shortness of breath.    ? albuterol (VENTOLIN HFA) 108 (90 Base) MCG/ACT inhaler Inhale 2-4 puffs into the lungs every 4 (four) hours as needed for wheezing or shortness of breath. 18 g 0  ? aspirin-acetaminophen-caffeine (EXCEDRIN MIGRAINE) 2599-357-01MG  tablet Take 2 tablets by mouth every 6 (six) hours as needed for headache.    ? clotrimazole-betamethasone (LOTRISONE) cream Apply topically 2 times daily. 30 g 0  ? lidocaine-prilocaine (EMLA) cream APPLY TOPICALLY AS NEEDED AS DIRECTED 30 g 0  ? nicotine (NICODERM CQ - DOSED IN MG/24 HOURS) 14 mg/24hr patch Place 1 patch (14 mg total) onto the skin daily. Apply 21 mg patch daily for 6 weeks, then appy the 133mpatch daily for 2 weeks, then apply the 7 mg patch daily for 2 weeks. 14 patch 0  ? nicotine (NICODERM CQ - DOSED IN MG/24 HOURS) 21 mg/24hr patch Place 1 patch (21 mg total) onto the skin daily. Apply the 21 mg patch daily for 6 weeks, then appy the 1463match daily for 2 weeks, then apply the 7 mg patch daily for 2 weeks. 14 patch 2  ? nicotine (NICODERM CQ - DOSED IN MG/24 HR) 7 mg/24hr patch Place 1 patch (7 mg total) onto the skin daily. **Apply 21 mg patch daily for 6 weeks, then appy the 25m6mtch daily for 2 weeks, then apply the 7 mg patch daily for 2 weeks.** 14 patch 0  ? ?No current facility-administered medications for this visit.  ? ? ?SURGICAL HISTORY:  ?Past Surgical History:  ?Procedure Laterality Date  ? IR BONE MARROW BIOPSY & ASPIRATION  06/21/2019  ? IR IMAGING GUIDED PORT INSERTION  06/21/2019  ? MASS BIOPSY Right 05/11/2019  ? Procedure: EXCISIONAL BIOPSY OF RIGHT  CERVICAL LYMPH NODE;  Surgeon: Melida Quitter, MD;  Location: [facility];  Service: ENT;  Laterality: Right;  ? TONSILLECTOMY    ? ? ?REVIEW OF SYSTEMS:  A comprehensive review of systems was negative.  ? ?PHYSICAL EXAMINATION: General appearance: alert, cooperative, and no distress ?Head: Normocephalic, without obvious abnormality, atraumatic ?Neck: no adenopathy, no JVD, supple, symmetrical, trachea midline, and thyroid not enlarged, symmetric, no tenderness/mass/nodules ?Lymph nodes: Cervical, supraclavicular, and axillary nodes normal. ?Resp: clear to auscultation bilaterally ?Back: negative, no kyphosis present, symmetric, no  curvature. ROM normal. No CVA tenderness. ?Cardio: regular rate and rhythm, S1, S2 normal, no murmur, click, rub or gallop ?GI: soft, non-tender; bowel sounds normal; no masses,  no organomegaly ?Extremities: extremities normal, atraumatic, no cyanosis or edema ? ?ECOG PERFORMANCE STATUS: 0 - Asymptomatic ? ?Blood pressure 114/81, pulse 85, temperature (!) 97.3 ?F (36.3 ?C), temperature source Tympanic, resp. rate 17, weight 191 lb 9.6 oz (86.9 kg), SpO2 97 %. ? ?LABORATORY DATA: ?Lab Results  ?Component Value Date  ? WBC 6.8 12/14/2021  ? HGB 11.5 (L) 12/14/2021  ? HCT 34.3 (L) 12/14/2021  ? MCV 86.0 12/14/2021  ? PLT 255 12/14/2021  ? ? ?  Chemistry   ?   ?Component Value Date/Time  ? NA 138 12/14/2021 1505  ? K 3.8 12/14/2021 1505  ? CL 108 12/14/2021 1505  ? CO2 23 12/14/2021 1505  ? BUN 19 12/14/2021 1505  ? CREATININE 0.69 12/14/2021 1505  ?    ?Component Value Date/Time  ? CALCIUM 8.9 12/14/2021 1505  ? ALKPHOS 51 12/14/2021 1505  ? AST 18 12/14/2021 1505  ? ALT 16 12/14/2021 1505  ? BILITOT 0.3 12/14/2021 1505  ?  ? ? ? ?RADIOGRAPHIC STUDIES: ?CT Chest W Contrast ? ?Result Date: 12/15/2021 ?CLINICAL DATA:  Hodgkin's disease; * Tracking Code: BO * EXAM: CT CHEST WITH CONTRAST TECHNIQUE: Multidetector CT imaging of the chest was performed during intravenous contrast administration. RADIATION DOSE REDUCTION: This exam was performed according to the departmental dose-optimization program which includes automated exposure control, adjustment of the mA and/or kV according to patient size and/or use of iterative reconstruction technique. CONTRAST:  50m OMNIPAQUE IOHEXOL 300 MG/ML  SOLN COMPARISON:  Chest CT dated February 05, 2021 FINDINGS: Cardiovascular: Normal heart size. No pericardial effusion. No significant vascular findings. Right chest wall port with tip positioned in the right atrium. Mediastinum/Nodes: Esophagus and thyroid are unremarkable. Anterior mediastinal soft tissue with more focal nodular component  measuring 0.8 x 1.4 cm on series 2, image 55, unchanged when compared to prior exam. Stable mildly enlarged 1.1 mm right lower paratracheal lymph node located on series 2, image 38. Stable prominent 9 mm right hilar lymph node located on series 2, image 51. No pathologically enlarged lymph nodes seen in the chest. Lungs/Pleura: Central airways are patent. New tubular right lower lobe opacity at site of previously seen focal bronchiectasis, compatible with mucocele. No pleural effusion or pneumothorax. Upper Abdomen: No acute abnormality. Musculoskeletal: No chest wall abnormality. No acute or significant osseous findings. IMPRESSION: 1. Stable soft tissue of the anterior mediastinum. 2. Unchanged prominent mediastinal and hilar lymph nodes. 3. No evidence of lymphadenopathy in the chest. 4. New tubular right lower lobe opacity at site of previously seen focal bronchiectasis, compatible with mucocele. Electronically Signed   By: LYetta GlassmanM.D.   On: 12/15/2021 18:17   ? ? ?ASSESSMENT AND PLAN: This is a very pleasant 31years old African-American female recently diagnosed with a stage IIA Hodgkin  lymphoma, nodular sclerosing subtype diagnosed in October 2020 and presented with right cervical and supraclavicular lymphadenopathy as well as right mediastinal lymphadenopathy.  The patient has no evidence of disease below the diaphragm. ?She does not have a bulky disease and she has no significant unfavorable risk factors. ?The patient underwent systemic chemotherapy with ABVD status post 2 cycles. ?This was followed by involved field radiation under the care of Dr. Isidore Moos. ?The patient is currently on observation and she is feeling fine with no concerning complaints. ?She had repeat CT scan of the chest performed recently.  I personally and independently reviewed the scan and discussed the result with the patient today. ?Her scan showed no concerning findings for disease recurrence. ?I recommended for her to  continue on observation with repeat CT scan of the chest in 1 year. ?She may be interested in removal of the Port-A-Cath but she will make her decision and call the office back if she decided to proceed with

## 2021-12-28 ENCOUNTER — Encounter: Payer: Self-pay | Admitting: Internal Medicine

## 2021-12-28 ENCOUNTER — Emergency Department (HOSPITAL_COMMUNITY)
Admission: EM | Admit: 2021-12-28 | Discharge: 2021-12-28 | Disposition: A | Discharge status: Home / Self Care | Payer: Self-pay | Attending: Emergency Medicine | Admitting: Emergency Medicine

## 2021-12-28 ENCOUNTER — Other Ambulatory Visit: Payer: Self-pay

## 2021-12-28 ENCOUNTER — Other Ambulatory Visit (HOSPITAL_COMMUNITY): Payer: Self-pay

## 2021-12-28 ENCOUNTER — Encounter (HOSPITAL_COMMUNITY): Payer: Self-pay

## 2021-12-28 ENCOUNTER — Emergency Department (HOSPITAL_COMMUNITY): Payer: Self-pay

## 2021-12-28 DIAGNOSIS — Z859 Personal history of malignant neoplasm, unspecified: Secondary | ICD-10-CM | POA: Insufficient documentation

## 2021-12-28 DIAGNOSIS — Z7951 Long term (current) use of inhaled steroids: Secondary | ICD-10-CM | POA: Insufficient documentation

## 2021-12-28 DIAGNOSIS — J209 Acute bronchitis, unspecified: Secondary | ICD-10-CM | POA: Insufficient documentation

## 2021-12-28 DIAGNOSIS — J45909 Unspecified asthma, uncomplicated: Secondary | ICD-10-CM | POA: Insufficient documentation

## 2021-12-28 DIAGNOSIS — Z7982 Long term (current) use of aspirin: Secondary | ICD-10-CM | POA: Insufficient documentation

## 2021-12-28 LAB — CBC WITH DIFFERENTIAL/PLATELET
Abs Immature Granulocytes: 0.01 10*3/uL (ref 0.00–0.07)
Basophils Absolute: 0 10*3/uL (ref 0.0–0.1)
Basophils Relative: 0 %
Eosinophils Absolute: 0.1 10*3/uL (ref 0.0–0.5)
Eosinophils Relative: 2 %
HCT: 38.9 % (ref 36.0–46.0)
Hemoglobin: 13 g/dL (ref 12.0–15.0)
Immature Granulocytes: 0 %
Lymphocytes Relative: 35 %
Lymphs Abs: 1.6 10*3/uL (ref 0.7–4.0)
MCH: 29.4 pg (ref 26.0–34.0)
MCHC: 33.4 g/dL (ref 30.0–36.0)
MCV: 88 fL (ref 80.0–100.0)
Monocytes Absolute: 0.6 10*3/uL (ref 0.1–1.0)
Monocytes Relative: 13 %
Neutro Abs: 2.2 10*3/uL (ref 1.7–7.7)
Neutrophils Relative %: 50 %
Platelets: 283 10*3/uL (ref 150–400)
RBC: 4.42 MIL/uL (ref 3.87–5.11)
RDW: 14.2 % (ref 11.5–15.5)
WBC: 4.5 10*3/uL (ref 4.0–10.5)
nRBC: 0 % (ref 0.0–0.2)

## 2021-12-28 LAB — BASIC METABOLIC PANEL
Anion gap: 9 (ref 5–15)
BUN: 20 mg/dL (ref 6–20)
CO2: 24 mmol/L (ref 22–32)
Calcium: 9.1 mg/dL (ref 8.9–10.3)
Chloride: 106 mmol/L (ref 98–111)
Creatinine, Ser: 0.61 mg/dL (ref 0.44–1.00)
GFR, Estimated: >60 mL/min (ref >60)
Glucose, Bld: 96 mg/dL (ref 70–99)
Potassium: 4.3 mmol/L (ref 3.5–5.1)
Sodium: 139 mmol/L (ref 135–145)

## 2021-12-28 LAB — TROPONIN I (HIGH SENSITIVITY)
Troponin I (High Sensitivity): <2 ng/L (ref <18)
Troponin I (High Sensitivity): <2 ng/L (ref <18)

## 2021-12-28 LAB — I-STAT BETA HCG BLOOD, ED (MC, WL, AP ONLY): I-stat hCG, quantitative: <5 m[IU]/mL (ref <5)

## 2021-12-28 MED ORDER — BENZONATATE 100 MG PO CAPS
100.0000 mg | ORAL_CAPSULE | Freq: Three times a day (TID) | ORAL | 0 refills | Status: AC
  Filled 2021-12-28: qty 21, 7d supply, fill #0

## 2021-12-28 MED ORDER — ALBUTEROL SULFATE HFA 108 (90 BASE) MCG/ACT IN AERS
2.0000 | INHALATION_SPRAY | Freq: Once | RESPIRATORY_TRACT | Status: AC
  Administered 2021-12-28: 2 via RESPIRATORY_TRACT
  Filled 2021-12-28: qty 6.7

## 2021-12-28 MED ORDER — METHYLPREDNISOLONE 4 MG PO TBPK
ORAL_TABLET | ORAL | 0 refills | Status: AC
  Filled 2021-12-28: qty 21, 6d supply, fill #0

## 2021-12-28 MED ORDER — DOXYCYCLINE HYCLATE 100 MG PO CAPS
100.0000 mg | ORAL_CAPSULE | Freq: Two times a day (BID) | ORAL | 0 refills | Status: AC
  Filled 2021-12-28: qty 14, 7d supply, fill #0

## 2021-12-28 MED ORDER — IPRATROPIUM-ALBUTEROL 0.5-2.5 (3) MG/3ML IN SOLN
3.0000 mL | Freq: Once | RESPIRATORY_TRACT | Status: AC
  Administered 2021-12-28: 3 mL via RESPIRATORY_TRACT
  Filled 2021-12-28: qty 3

## 2021-12-28 NOTE — ED Provider Notes (Signed)
?[facility] DEPT ?Provider Note ? ? ?CSN: [id number] ?Arrival date & time: 12/28/21  4098 ? ?  ? ?History ? ?Chief Complaint  ?Patient presents with  ? Chest Pain  ? Cough  ? ? ?Faith Pope is a 31 y.o. female who  has a past medical history of Asthma, Cancer ([place]), and History of radiation therapy (09/08/19- 09/22/19). ?She has three days of painful cough, wheezing and sob. She has a hx of recurrent CAP. She denies fever or vomiting. ? ? ?Chest Pain ?Associated symptoms: cough   ?Cough ?Associated symptoms: chest pain   ? ?  ? ?Home Medications ?Prior to Admission medications   ?Medication Sig Start Date End Date Taking? Authorizing Provider  ?benzonatate (TESSALON) 100 MG capsule Take 1 capsule (100 mg total) by mouth every 8 (eight) hours. 12/28/21  Yes Margarita Mail, PA-C  ?doxycycline (VIBRAMYCIN) 100 MG capsule Take 1 capsule (100 mg total) by mouth 2 (two) times daily. 12/28/21  Yes Margarita Mail, PA-C  ?methylPREDNISolone (MEDROL DOSEPAK) 4 MG TBPK tablet Use as directed 12/28/21  Yes Maylea Soria, PA-C  ?albuterol (PROVENTIL) (2.5 MG/3ML) 0.083% nebulizer solution INHALE 1 VIAL VIA NEBULIZER EVERY 6 HOURS AS NEEDED FOR WHEEZING OR SHORTNESS OF BREATH 08/29/20 08/29/21  Long, Wonda Olds, MD  ?albuterol (VENTOLIN HFA) 108 (90 Base) MCG/ACT inhaler Inhale 1-2 puffs into the lungs every 6 (six) hours as needed for wheezing or shortness of breath.    [provider]  ?albuterol (VENTOLIN HFA) 108 (90 Base) MCG/ACT inhaler Inhale 2-4 puffs into the lungs every 4 (four) hours as needed for wheezing or shortness of breath. 02/03/21   Quintella Reichert, MD  ?aspirin-acetaminophen-caffeine [facility] MIGRAINE) [phone] MG tablet Take 2 tablets by mouth every 6 (six) hours as needed for headache.    [provider]  ?clotrimazole-betamethasone (LOTRISONE) cream Apply topically 2 times daily. 10/09/21   Lorenda Peck, DPM  ?lidocaine-prilocaine (EMLA) cream APPLY TOPICALLY AS  NEEDED AS DIRECTED 10/23/21 10/23/22  Curt Bears, MD  ?nicotine (NICODERM CQ - DOSED IN MG/24 HOURS) 14 mg/24hr patch Place 1 patch (14 mg total) onto the skin daily. Apply 21 mg patch daily for 6 weeks, then appy the '14mg'$  patch daily for 2 weeks, then apply the 7 mg patch daily for 2 weeks. 09/18/21   Eppie Gibson, MD  ?nicotine (NICODERM CQ - DOSED IN MG/24 HOURS) 21 mg/24hr patch Place 1 patch (21 mg total) onto the skin daily. Apply the 21 mg patch daily for 6 weeks, then appy the '14mg'$  patch daily for 2 weeks, then apply the 7 mg patch daily for 2 weeks. 09/18/21   Eppie Gibson, MD  ?nicotine (NICODERM CQ - DOSED IN MG/24 HR) 7 mg/24hr patch Place 1 patch (7 mg total) onto the skin daily. **Apply 21 mg patch daily for 6 weeks, then appy the '14mg'$  patch daily for 2 weeks, then apply the 7 mg patch daily for 2 weeks.** 09/18/21   Eppie Gibson, MD  ?omeprazole (PRILOSEC) 40 MG capsule Take 1 capsule (40 mg total) by mouth daily. ?Patient not taking: Reported on 08/30/2019 07/28/19 07/13/20  Harle Stanford., PA-C  ?prochlorperazine (COMPAZINE) 10 MG tablet Take 1 tablet (10 mg total) by mouth every 6 (six) hours as needed for nausea or vomiting. ?Patient not taking: Reported on 08/30/2019 06/23/19 07/13/20  Heilingoetter, Cassandra L, PA-C  ?   ? ?Allergies    ?Other, Shellfish allergy, Amoxicillin, and Penicillins   ? ?Review of Systems   ?Review of  Systems  ?Respiratory:  Positive for cough.   ?Cardiovascular:  Positive for chest pain.  ? ?Physical Exam ?Updated Vital Signs ?BP 125/83   Pulse 93   Temp 98 ?F (36.7 ?C) (Oral)   Resp 19   Ht '5\' 2"'$  (1.575 m)   Wt 86.6 kg   LMP 12/19/2021 (Approximate)   SpO2 100%   BMI 34.93 kg/m?  ?Physical Exam ?Vitals and nursing note reviewed.  ?Constitutional:   ?   General: She is not in acute distress. ?   Appearance: She is well-developed. She is not diaphoretic.  ?   Comments: tearful  ?HENT:  ?   Head: Normocephalic and atraumatic.  ?   Right Ear: External ear normal.   ?   Left Ear: External ear normal.  ?   Nose: Nose normal.  ?   Mouth/Throat:  ?   Mouth: Mucous membranes are moist.  ?Eyes:  ?   General: No scleral icterus. ?   Conjunctiva/sclera: Conjunctivae normal.  ?Cardiovascular:  ?   Rate and Rhythm: Normal rate and regular rhythm.  ?   Heart sounds: Normal heart sounds. No murmur heard. ?  No friction rub. No gallop.  ?Pulmonary:  ?   Effort: Pulmonary effort is normal. No respiratory distress.  ?   Breath sounds: Wheezing present.  ?Abdominal:  ?   General: Bowel sounds are normal. There is no distension.  ?   Palpations: Abdomen is soft. There is no mass.  ?   Tenderness: There is no abdominal tenderness. There is no guarding.  ?Musculoskeletal:  ?   Cervical back: Normal range of motion.  ?Skin: ?   General: Skin is warm and dry.  ?Neurological:  ?   Mental Status: She is alert and oriented to person, place, and time.  ?Psychiatric:     ?   Behavior: Behavior normal.  ? ? ?ED Results / Procedures / Treatments   ?Labs ?(all labs ordered are listed, but only abnormal results are displayed) ?Labs Reviewed  ?RESP PANEL BY RT-PCR (FLU A&B, COVID) [id number]  ?BASIC METABOLIC PANEL  ?CBC WITH DIFFERENTIAL/PLATELET  ?I-STAT BETA HCG BLOOD, ED ([facility], [facility], [facility] ONLY)  ?TROPONIN I (HIGH SENSITIVITY)  ?TROPONIN I (HIGH SENSITIVITY)  ? ? ?EKG ?EKG Interpretation ? ?Date/Time:  Friday Dec 28 2021 12:11:00 EDT ?Ventricular Rate:  81 ?PR Interval:  150 ?QRS Duration: 85 ?QT Interval:  380 ?QTC Calculation: 442 ?R Axis:   22 ?Text Interpretation: Sinus rhythm Since last tracing arm lead abnormality corrected Otherwise no significant change Confirmed by Daleen Bo [phone]) on 12/28/2021 1:31:13 PM ? ?Radiology ?DG Chest 2 View ? ?Result Date: 12/28/2021 ?CLINICAL DATA:  Chest pain and asthma. Nonproductive cough for 3 days. Evaluate for pneumonia. History of non-Hodgkin's lymphoma. Patient reports being cancer free for 2 years. EXAM: CHEST - 2 VIEW COMPARISON:  Chest two views 02/05/2021  FINDINGS: Right chest wall porta catheter tip overlies the superior vena cava/right atrial junction. Cardiac silhouette and mediastinal contours are within normal limits. The lungs are clear. No pleural effusion or pneumothorax. No acute skeletal abnormality. IMPRESSION: No active cardiopulmonary disease. Electronically Signed   By: Yvonne Kendall M.D.   On: 12/28/2021 10:51   ? ?Procedures ?Procedures  ? ? ?Medications Ordered in ED ?Medications  ?albuterol (VENTOLIN HFA) 108 (90 Base) MCG/ACT inhaler 2 puff (has no administration in time range)  ?ipratropium-albuterol (DUONEB) 0.5-2.5 (3) MG/3ML nebulizer solution 3 mL (3 mLs Nebulization Given 12/28/21 1423)  ? ? ?ED Course/ Medical  Decision Making/ A&P ?Clinical Course as of 12/28/21 1519  ?Fri Dec 28, 2021  ?1518 Troponin I (High Sensitivity) [AH]  ?1518 I-Stat beta hCG blood, ED [AH]  ?1610 Basic metabolic panel [AH]  ?1518 CBC with Differential [AH]  ?[place] 2 View ?I visualized the cxr- no acute findings [AH]  ?1519 ED EKG ?NSR 81 [AH]  ?  ?Clinical Course User Index ?[AH] Margarita Mail, PA-C  ? ?                        ?Medical Decision Making ?Patient here with URI sxs and asthma exacerbation.  ?Differential diagnosis for emergent cause of cough includes but is not limited to upper respiratory infection, lower respiratory infection, allergies, asthma, irritants, foreign body, medications such as ACE inhibitors, reflux, asthma, CHF, lung cancer, interstitial lung disease, psychiatric causes, postnasal drip and postinfectious bronchospasm. ? ?Patient is extremely concerned that she has CAP and states that every time she gets bronchitis she returns and is dx with CAP. Will tx with doxy, medrol, tessalon and albuterol.  ? ?She received duoneb with improvement. ? ?Problems Addressed: ?Acute bronchitis, unspecified organism: complicated acute illness or injury ? ?Amount and/or Complexity of Data Reviewed ?Labs: ordered. ?   Details: no acute  findings ?Radiology: ordered and independent interpretation performed. Decision-making details documented in ED Course. ?Discussion of management or test interpretation with external provider(s): Social HX contributing to case- unin

## 2021-12-28 NOTE — ED Provider Triage Note (Signed)
Emergency Medicine Provider Triage Evaluation Note ? ?Faith Pope , a 31 y.o. female  was evaluated in triage.  Pt complains of PNA symptoms for the last 3 days. Patient is having some central, constant CP as well. No exertional symptoms. No fever. No chemo/radiation in the last 2 years.  ? ?Review of Systems  ?Positive: Cough and CP ?Negative: Fever, abdominal pain, vomiting ? ?Physical Exam  ?BP (!) 135/100 (BP Location: Left Arm)   Pulse (!) 109   Temp 98 ?F (36.7 ?C) (Oral)   Resp 18   Ht '5\' 2"'$  (1.575 m)   Wt 86.6 kg   LMP 12/19/2021 (Approximate)   SpO2 100%   BMI 34.93 kg/m?  ?Gen:   Awake, no distress   ?Resp:  Normal effort, no wheezing.  ?MSK:   Moves extremities without difficulty  ? ?Medical Decision Making  ?Medically screening exam initiated at 10:32 AM.  Appropriate orders placed.  Faith Pope was informed that the remainder of the evaluation will be completed by another provider, this initial triage assessment does not replace that evaluation, and the importance of remaining in the ED until their evaluation is complete. ? ?  ?Margette Fast, MD ?12/28/21 1036 ? ?

## 2021-12-28 NOTE — ED Triage Notes (Addendum)
Patient c/o chest pain, asthma, and a non productive cough x 3 days. Patient has a history of pneumonia. ?

## 2021-12-28 NOTE — ED Notes (Signed)
Patient continues to refuse respiratory panel. ?

## 2021-12-28 NOTE — ED Notes (Signed)
Patient refused Covid/flu swab test. ?

## 2021-12-28 NOTE — Discharge Instructions (Signed)
Use your inhaler 2 puffs every 4 hours as needed for cough  and sob. ?Contact a health care provider if: ?Your symptoms do not improve after 2 weeks. ?You have trouble coughing up the mucus. ?Your cough keeps you awake at night. ?You have a fever. ?Get help right away if you: ?Cough up blood. ?Feel pain in your chest. ?Have severe shortness of breath. ?Faint or keep feeling like you are going to faint. ?Have a severe headache. ?Have a fever or chills that get worse. ?

## 2022-01-04 IMAGING — CT CT CHEST W/ CM
2 of 4 series · 15 of 36 positions shown, 18 images · IV contrast (OMNIPAQUE)
Comparison: 06/20/2020

CLINICAL DATA: Hodgkin's lymphoma, chemotherapy and XRT complete

EXAM:
CT CHEST WITH CONTRAST
TECHNIQUE: Multidetector CT imaging of the chest was performed during
intravenous contrast administration.
CONTRAST:  75mL OMNIPAQUE IOHEXOL 300 MG/ML  SOLN

[Series 2: axial st · axial · 0.69mm/px · z∈[-275,-39]mm · 12 of 138 slices shown, 15 images]
[im 10/138  mediastinal]
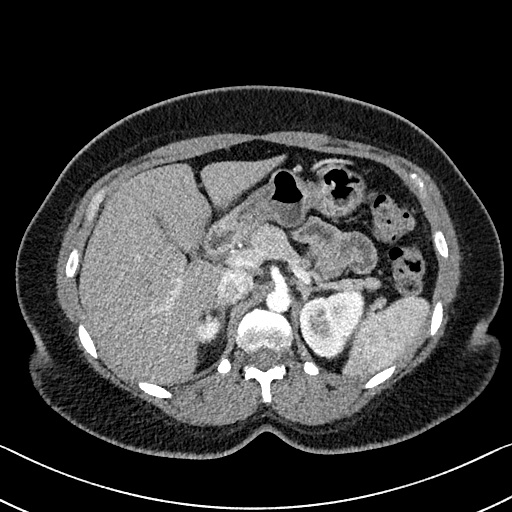
[im 10/138  lung]
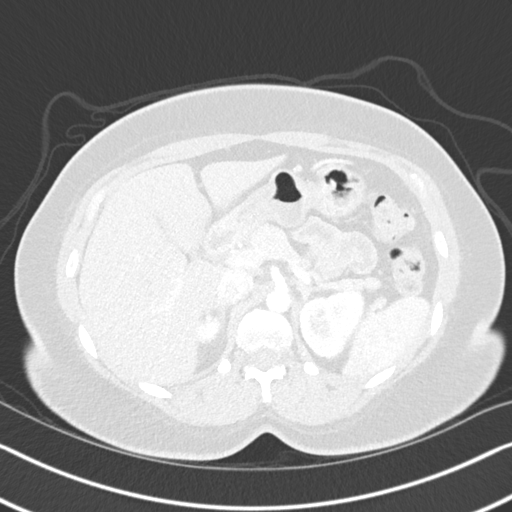
[im 20/138  lung]
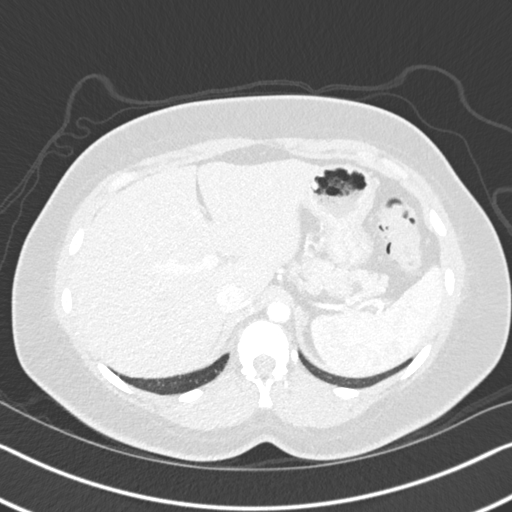
[im 30/138  lung]
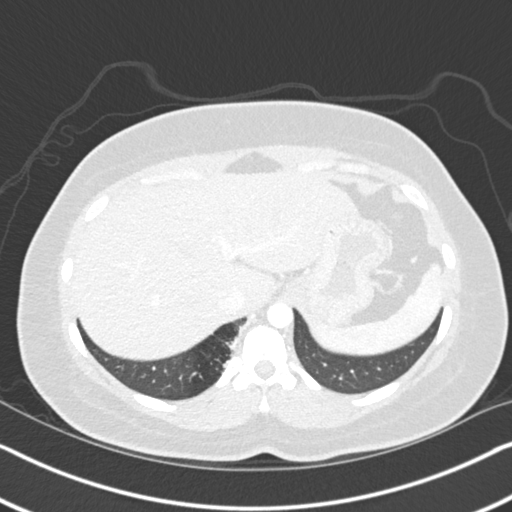
[im 40/138  lung]
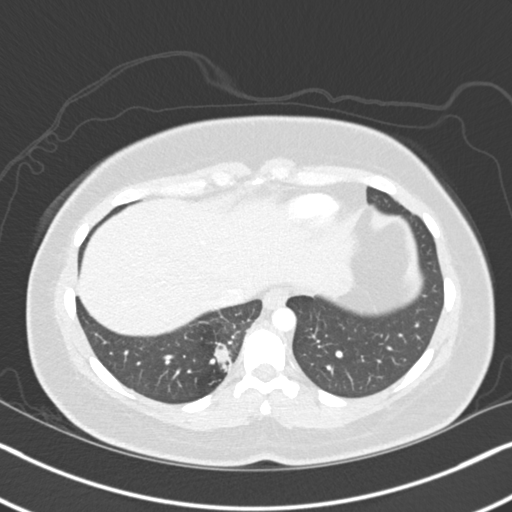
[im 49/138  mediastinal]
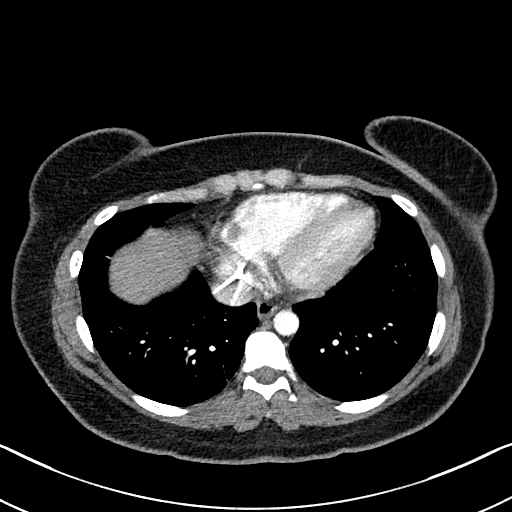
[im 49/138  lung]
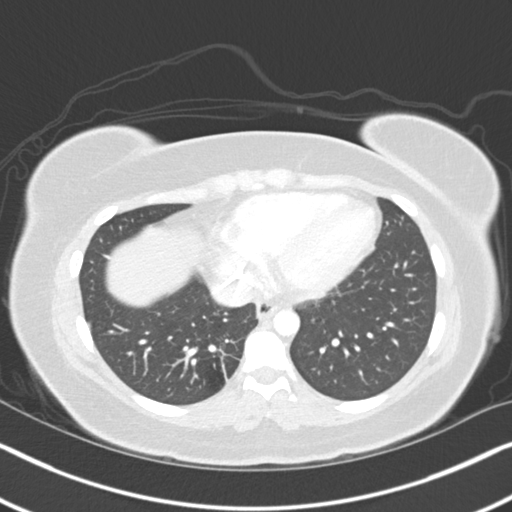
[im 59/138  lung]
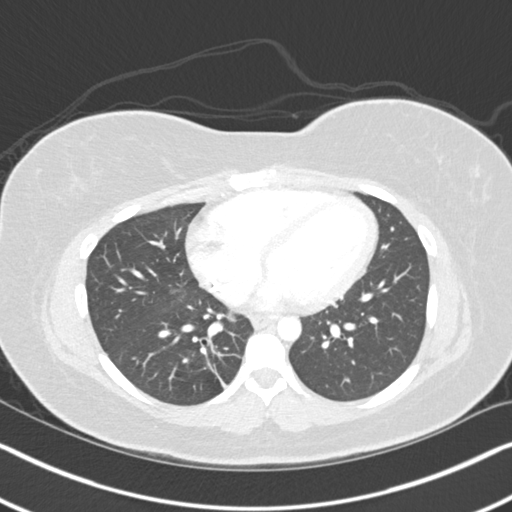
[im 79/138  lung]
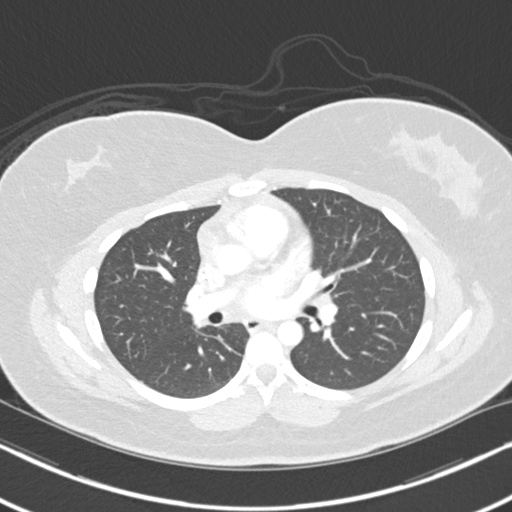
[im 89/138  lung]
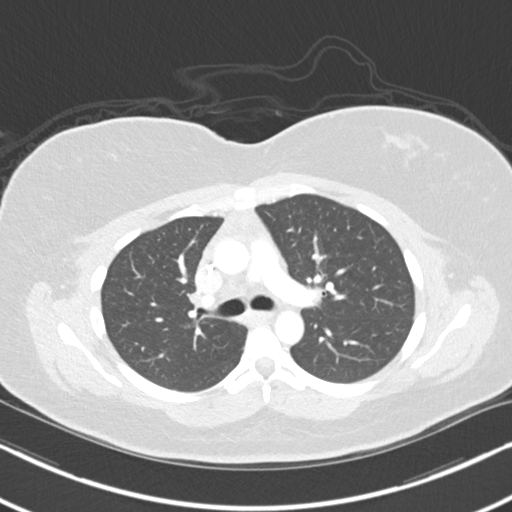
[im 98/138  mediastinal]
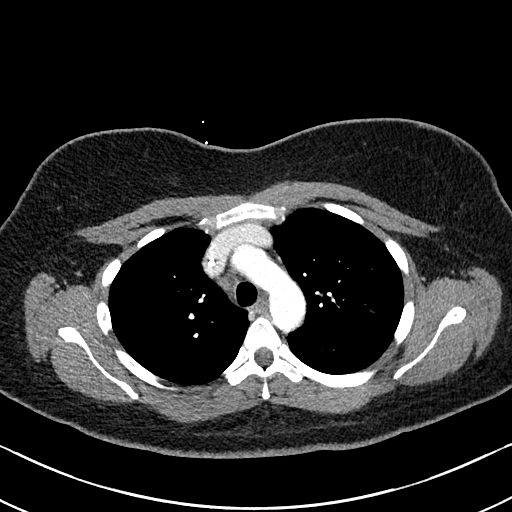
[im 98/138  lung]
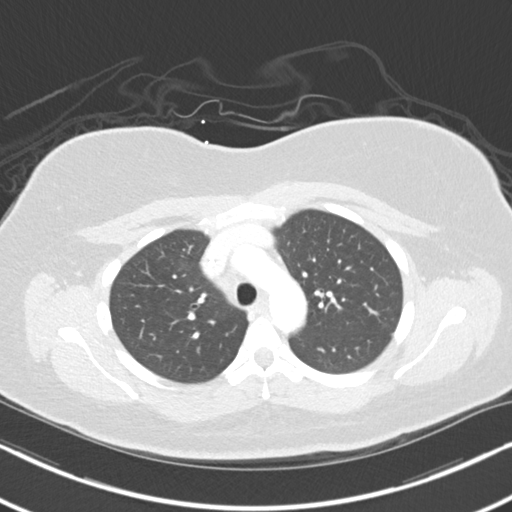
[im 108/138  lung]
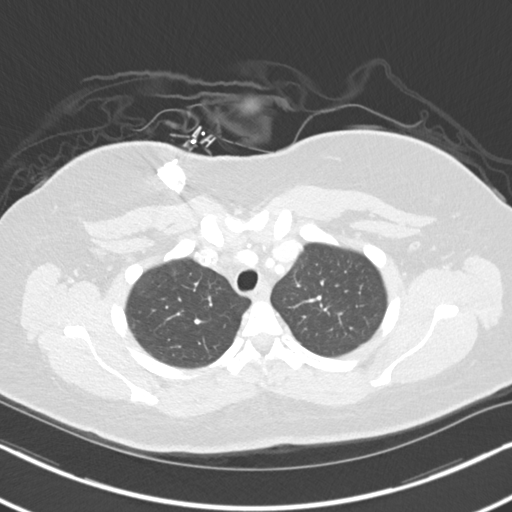
[im 118/138  lung]
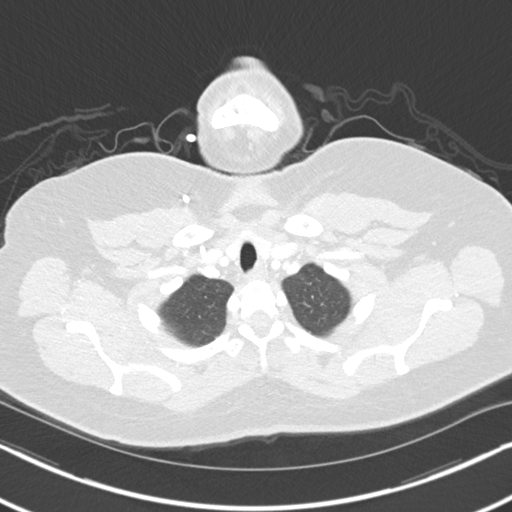
[im 128/138  lung]
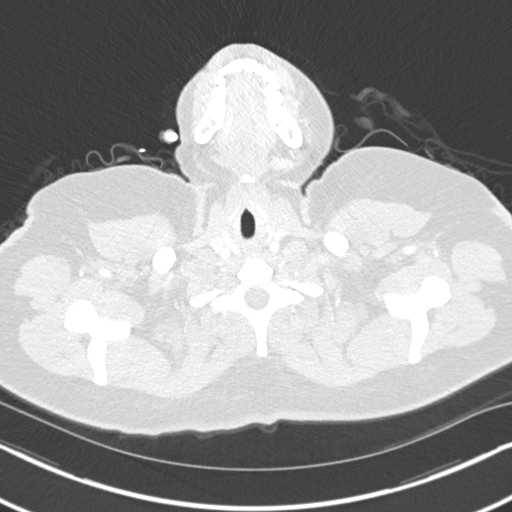

[Series 5: coronal · coronal · 0.59mm/px · 3 of 122 slices shown]
[im 25/122  lung]
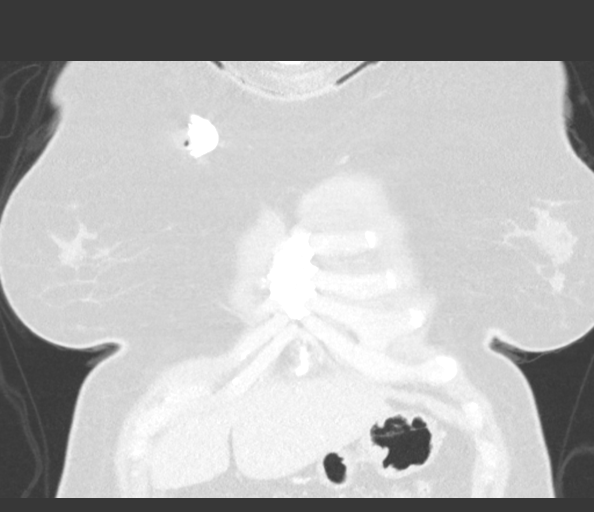
[im 49/122  lung]
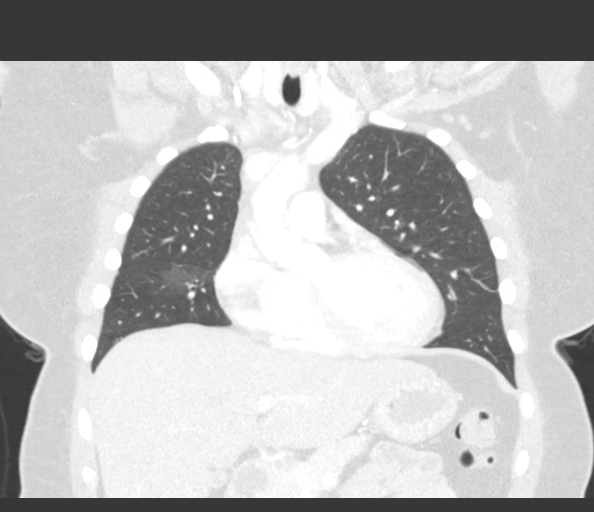
[im 73/122  lung]
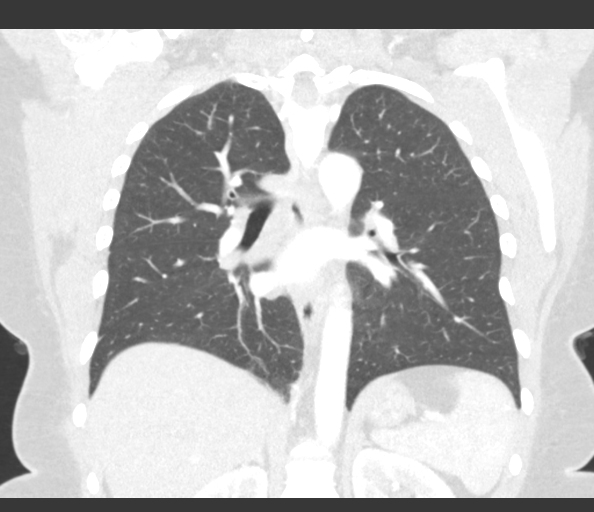

[15 of 36 positions shown; findings below may reference images not displayed]

FINDINGS: Cardiovascular: Right chest port catheter. Normal heart size. No
pericardial effusion.

Mediastinum/Nodes: Stable post treatment appearance of numerous
mediastinal and hilar lymph nodes as well as soft tissue in the
anterior mediastinum, likely thymic remnant. Index pretracheal node
measures 1.2 x 1.1 cm, unchanged (series 2, image 43). Thyroid
gland, trachea, and esophagus demonstrate no significant findings.

Lungs/Pleura: Unchanged bandlike scarring of the dependent medial
right lung base with focal bronchiectasis and an impacted bronchiole
(series 7, image 98). Stable, benign fissural nodules of the right
middle lobe (series 7, image 68). No pleural effusion or
pneumothorax.

Upper Abdomen: No acute abnormality.

Musculoskeletal: No chest wall mass or suspicious bone lesions
identified.
IMPRESSION: 1. Stable post treatment appearance of numerous mediastinal and
hilar lymph nodes as well as soft tissue in the anterior
mediastinum, consistent with sustained treatment response of
lymphoma.
2. Unchanged scarring and focal bronchiectasis of the dependent
right lower lobe.

## 2022-03-18 ENCOUNTER — Other Ambulatory Visit: Payer: Self-pay

## 2022-03-22 ENCOUNTER — Telehealth: Payer: Self-pay | Admitting: Internal Medicine

## 2022-03-22 NOTE — Telephone Encounter (Signed)
Per 7/28 phone line pt called and made an appointment for a port flush .  Appointment made pr pt request

## 2022-03-27 ENCOUNTER — Inpatient Hospital Stay: Payer: Medicaid Other

## 2022-03-28 ENCOUNTER — Inpatient Hospital Stay: Payer: Medicaid Other | Attending: Internal Medicine

## 2022-03-28 ENCOUNTER — Other Ambulatory Visit: Payer: Self-pay

## 2022-03-28 DIAGNOSIS — Z452 Encounter for adjustment and management of vascular access device: Secondary | ICD-10-CM | POA: Insufficient documentation

## 2022-03-28 DIAGNOSIS — C8111 Nodular sclerosis classical Hodgkin lymphoma, lymph nodes of head, face, and neck: Secondary | ICD-10-CM | POA: Insufficient documentation

## 2022-03-28 DIAGNOSIS — Z95828 Presence of other vascular implants and grafts: Secondary | ICD-10-CM

## 2022-03-28 MED ORDER — HEPARIN SOD (PORK) LOCK FLUSH 100 UNIT/ML IV SOLN
500.0000 [IU] | Freq: Once | INTRAVENOUS | Status: AC | PRN
  Administered 2022-03-28: 500 [IU]

## 2022-03-28 MED ORDER — SODIUM CHLORIDE 0.9% FLUSH
10.0000 mL | INTRAVENOUS | Status: DC | PRN
  Administered 2022-03-28: 10 mL

## 2022-04-17 ENCOUNTER — Telehealth: Payer: Self-pay | Admitting: Internal Medicine

## 2022-04-17 NOTE — Telephone Encounter (Signed)
Called patient regarding upcoming 2024 appointments, patient is notified.  

## 2022-04-22 ENCOUNTER — Encounter: Payer: Self-pay | Admitting: Internal Medicine

## 2022-04-22 ENCOUNTER — Other Ambulatory Visit (HOSPITAL_COMMUNITY): Payer: Self-pay

## 2022-04-22 MED ORDER — SULFAMETHOXAZOLE-TRIMETHOPRIM 800-160 MG PO TABS
1.0000 | ORAL_TABLET | Freq: Two times a day (BID) | ORAL | 0 refills | Status: AC
  Filled 2022-04-22: qty 20, 10d supply, fill #0

## 2022-08-04 ENCOUNTER — Encounter: Payer: Self-pay | Admitting: Internal Medicine

## 2022-08-04 ENCOUNTER — Emergency Department (HOSPITAL_COMMUNITY)
Admission: EM | Admit: 2022-08-04 | Discharge: 2022-08-04 | Disposition: A | Discharge status: Home / Self Care | Payer: Medicaid Other | Attending: Emergency Medicine | Admitting: Emergency Medicine

## 2022-08-04 ENCOUNTER — Emergency Department (HOSPITAL_COMMUNITY): Payer: Medicaid Other

## 2022-08-04 ENCOUNTER — Other Ambulatory Visit: Payer: Self-pay

## 2022-08-04 DIAGNOSIS — J069 Acute upper respiratory infection, unspecified: Secondary | ICD-10-CM | POA: Insufficient documentation

## 2022-08-04 DIAGNOSIS — J45909 Unspecified asthma, uncomplicated: Secondary | ICD-10-CM | POA: Insufficient documentation

## 2022-08-04 DIAGNOSIS — Z1152 Encounter for screening for COVID-19: Secondary | ICD-10-CM | POA: Insufficient documentation

## 2022-08-04 DIAGNOSIS — J45901 Unspecified asthma with (acute) exacerbation: Secondary | ICD-10-CM

## 2022-08-04 LAB — COMPREHENSIVE METABOLIC PANEL
ALT: 21 U/L (ref 0–44)
AST: 24 U/L (ref 15–41)
Albumin: 3.8 g/dL (ref 3.5–5.0)
Alkaline Phosphatase: 56 U/L (ref 38–126)
Anion gap: 10 (ref 5–15)
BUN: 14 mg/dL (ref 6–20)
CO2: 21 mmol/L — ABNORMAL LOW (ref 22–32)
Calcium: 9.2 mg/dL (ref 8.9–10.3)
Chloride: 107 mmol/L (ref 98–111)
Creatinine, Ser: 0.79 mg/dL (ref 0.44–1.00)
GFR, Estimated: >60 mL/min (ref >60)
Glucose, Bld: 124 mg/dL — ABNORMAL HIGH (ref 70–99)
Potassium: 4.4 mmol/L (ref 3.5–5.1)
Sodium: 138 mmol/L (ref 135–145)
Total Bilirubin: 0.2 mg/dL — ABNORMAL LOW (ref 0.3–1.2)
Total Protein: 7.2 g/dL (ref 6.5–8.1)

## 2022-08-04 LAB — D-DIMER, QUANTITATIVE: D-Dimer, Quant: 0.35 ug/mL-FEU (ref 0.00–0.50)

## 2022-08-04 LAB — CBC WITH DIFFERENTIAL/PLATELET
Abs Immature Granulocytes: 0.02 10*3/uL (ref 0.00–0.07)
Basophils Absolute: 0 10*3/uL (ref 0.0–0.1)
Basophils Relative: 0 %
Eosinophils Absolute: 0.5 10*3/uL (ref 0.0–0.5)
Eosinophils Relative: 9 %
HCT: 38.2 % (ref 36.0–46.0)
Hemoglobin: 12.5 g/dL (ref 12.0–15.0)
Immature Granulocytes: 0 %
Lymphocytes Relative: 42 %
Lymphs Abs: 2.2 10*3/uL (ref 0.7–4.0)
MCH: 29 pg (ref 26.0–34.0)
MCHC: 32.7 g/dL (ref 30.0–36.0)
MCV: 88.6 fL (ref 80.0–100.0)
Monocytes Absolute: 0.4 10*3/uL (ref 0.1–1.0)
Monocytes Relative: 8 %
Neutro Abs: 2.2 10*3/uL (ref 1.7–7.7)
Neutrophils Relative %: 41 %
Platelets: 318 10*3/uL (ref 150–400)
RBC: 4.31 MIL/uL (ref 3.87–5.11)
RDW: 14 % (ref 11.5–15.5)
WBC: 5.4 10*3/uL (ref 4.0–10.5)
nRBC: 0 % (ref 0.0–0.2)

## 2022-08-04 LAB — RESP PANEL BY RT-PCR (RSV, FLU A&B, COVID)  RVPGX2
Influenza A by PCR: NEGATIVE
Influenza B by PCR: NEGATIVE
Resp Syncytial Virus by PCR: NEGATIVE
SARS Coronavirus 2 by RT PCR: NEGATIVE

## 2022-08-04 LAB — I-STAT BETA HCG BLOOD, ED (MC, WL, AP ONLY): I-stat hCG, quantitative: <5 m[IU]/mL (ref <5)

## 2022-08-04 LAB — LACTIC ACID, PLASMA
Lactic Acid, Venous: 2.1 mmol/L (ref 0.5–1.9)
Lactic Acid, Venous: 1.1 mmol/L (ref 0.5–1.9)

## 2022-08-04 MED ORDER — IPRATROPIUM-ALBUTEROL 0.5-2.5 (3) MG/3ML IN SOLN
3.0000 mL | Freq: Once | RESPIRATORY_TRACT | Status: AC
  Administered 2022-08-04: 3 mL via RESPIRATORY_TRACT
  Filled 2022-08-04: qty 3

## 2022-08-04 MED ORDER — PREDNISONE 20 MG PO TABS
40.0000 mg | ORAL_TABLET | Freq: Every day | ORAL | 0 refills | Status: AC
  Filled 2022-08-04: qty 8, 4d supply, fill #0

## 2022-08-04 MED ORDER — SODIUM CHLORIDE 0.9 % IV BOLUS
1000.0000 mL | Freq: Once | INTRAVENOUS | Status: AC
  Administered 2022-08-04: 1000 mL via INTRAVENOUS

## 2022-08-04 MED ORDER — ALBUTEROL SULFATE HFA 108 (90 BASE) MCG/ACT IN AERS
2.0000 | INHALATION_SPRAY | Freq: Once | RESPIRATORY_TRACT | Status: AC
  Administered 2022-08-04: 2 via RESPIRATORY_TRACT
  Filled 2022-08-04: qty 6.7

## 2022-08-04 MED ORDER — KETOROLAC TROMETHAMINE 15 MG/ML IJ SOLN
15.0000 mg | Freq: Once | INTRAMUSCULAR | Status: AC
  Administered 2022-08-04: 15 mg via INTRAVENOUS
  Filled 2022-08-04: qty 1

## 2022-08-04 MED ORDER — METHYLPREDNISOLONE SODIUM SUCC 125 MG IJ SOLR
125.0000 mg | Freq: Once | INTRAMUSCULAR | Status: AC
  Administered 2022-08-04: 125 mg via INTRAVENOUS
  Filled 2022-08-04: qty 2

## 2022-08-04 NOTE — ED Provider Triage Note (Signed)
Emergency Medicine Provider Triage Evaluation Note  Rabiah Goeser , a 31 y.o. female  was evaluated in triage.  Pt complains of cough and shortness of breath x 1 week.  History of asthma.  Patient has a history of Hodgkin's lymphoma currently in remission.  Use albuterol right before arrival.  No fever.  Review of Systems  Positive: Cough, shortness of breath Negative: fever  Physical Exam  BP (!) 141/92 (BP Location: Right Arm)   Pulse (!) 127   Temp 99.7 F (37.6 C)   Resp (!) 24   SpO2 99%  Gen:   Awake, no distress   Resp:  Normal effort  MSK:   Moves extremities without difficulty  Other:    Medical Decision Making  Medically screening exam initiated at 3:26 PM.  Appropriate orders placed.  Aprille Sawhney was informed that the remainder of the evaluation will be completed by another provider, this initial triage assessment does not replace that evaluation, and the importance of remaining in the ED until their evaluation is complete.  Patient tachycardic in triage unsure whether this is due to possible fever versus albuterol use to arrival.  Borderline febrile at 99.7 F.  Lactic acid and blood cultures ordered.  CXR Labs COVID/flu   Suzy Bouchard, [place] 08/04/22 1527

## 2022-08-04 NOTE — Discharge Instructions (Addendum)
You were seen in the emergency department today for an asthma exacerbation. You were given breathing treatments and your first dose of steroids here. You were prescribed additional steroids (prednisone) to take during the following 4 days to help your symptoms improve. Please take this as directed.  Continue to use your albuterol every 4-6 hours as needed and continue to take any other asthma medication that you use.   Please make an appointment with your primary care doctor within one week to assure improvement or resolution in symptoms.  Asthma can get worse after it gets better, sometimes suddenly. If your breathing worsens and does not improve with your inhaler, or you need to use your inhaler more than 2 times every 4 hours, you should return to the emergency department.

## 2022-08-04 NOTE — ED Triage Notes (Signed)
Patient here with complaint of cough that started one week ago. Patient has history of asthma and reports using home medications for that multiple times over the last three days. Patient is alert, oriented, speaking in complete sentences, and coughing frequently in triage.

## 2022-08-04 NOTE — ED Provider Notes (Signed)
[facility] EMERGENCY DEPARTMENT Provider Note   CSN: [id number] Arrival date & time: 08/04/22  1512     History  Chief Complaint  Patient presents with   Cough    Faith Pope is a 31 y.o. female with past medical history significant for asthma, Hodgkin's lymphoma in remission, presenting with concerns for 2 weeks of cough.  Patient reports that she has had cough, congestion for 2 weeks.  She states approximately 5 days ago she began taking leftover antibiotics from her prior diagnosis of pneumonia.  She states that she did feel better, however she was last able to take the antibiotic last night.  Today she has felt worse.  She does not have an albuterol inhaler, however she does have a nebulizer and took a nebulizer treatment prior to arrival.  She states that did improve her breathing.  She denies fevers, nausea, vomiting over this time.  She denies hemoptysis, estrogen use, prolonged travel.  She does smoke black and milds.    Home Medications Prior to Admission medications   Medication Sig Start Date End Date Taking? Authorizing Provider  albuterol (PROVENTIL) (2.5 MG/3ML) 0.083% nebulizer solution INHALE 1 VIAL VIA NEBULIZER EVERY 6 HOURS AS NEEDED FOR WHEEZING OR SHORTNESS OF BREATH 08/29/20 08/29/21  Long, Wonda Olds, MD  albuterol (VENTOLIN HFA) 108 (90 Base) MCG/ACT inhaler Inhale 1-2 puffs into the lungs every 6 (six) hours as needed for wheezing or shortness of breath.    [provider]  albuterol (VENTOLIN HFA) 108 (90 Base) MCG/ACT inhaler Inhale 2-4 puffs into the lungs every 4 (four) hours as needed for wheezing or shortness of breath. 02/03/21   Quintella Reichert, MD  aspirin-acetaminophen-caffeine (EXCEDRIN MIGRAINE) [phone] MG tablet Take 2 tablets by mouth every 6 (six) hours as needed for headache.    [provider]  benzonatate (TESSALON) 100 MG capsule Take 1 capsule (100 mg total) by mouth every 8 (eight) hours. 12/28/21   Harris,  Vernie Shanks, PA-C  clotrimazole-betamethasone (LOTRISONE) cream Apply topically 2 times daily. 10/09/21   Lorenda Peck, DPM  doxycycline (VIBRAMYCIN) 100 MG capsule Take 1 capsule (100 mg total) by mouth 2 (two) times daily. 12/28/21   Margarita Mail, PA-C  lidocaine-prilocaine (EMLA) cream APPLY TOPICALLY AS NEEDED AS DIRECTED 10/23/21 10/23/22  Curt Bears, MD  methylPREDNISolone (MEDROL DOSEPAK) 4 MG TBPK tablet Use as directed 12/28/21   Margarita Mail, PA-C  nicotine (NICODERM CQ - DOSED IN MG/24 HOURS) 14 mg/24hr patch Place 1 patch (14 mg total) onto the skin daily. Apply 21 mg patch daily for 6 weeks, then appy the '14mg'$  patch daily for 2 weeks, then apply the 7 mg patch daily for 2 weeks. 09/18/21   Eppie Gibson, MD  nicotine (NICODERM CQ - DOSED IN MG/24 HOURS) 21 mg/24hr patch Place 1 patch (21 mg total) onto the skin daily. Apply the 21 mg patch daily for 6 weeks, then appy the '14mg'$  patch daily for 2 weeks, then apply the 7 mg patch daily for 2 weeks. 09/18/21   Eppie Gibson, MD  nicotine (NICODERM CQ - DOSED IN MG/24 HR) 7 mg/24hr patch Place 1 patch (7 mg total) onto the skin daily. **Apply 21 mg patch daily for 6 weeks, then appy the '14mg'$  patch daily for 2 weeks, then apply the 7 mg patch daily for 2 weeks.** 09/18/21   Eppie Gibson, MD  sulfamethoxazole-trimethoprim (BACTRIM DS) 800-160 MG tablet Take 1 tablet by mouth 2 (two) times daily for 10 days 04/21/22  omeprazole (PRILOSEC) 40 MG capsule Take 1 capsule (40 mg total) by mouth daily. Patient not taking: Reported on 08/30/2019 07/28/19 07/13/20  Harle Stanford., PA-C  prochlorperazine (COMPAZINE) 10 MG tablet Take 1 tablet (10 mg total) by mouth every 6 (six) hours as needed for nausea or vomiting. Patient not taking: Reported on 08/30/2019 06/23/19 07/13/20  Heilingoetter, Cassandra L, PA-C      Allergies    Other, Shellfish allergy, Amoxicillin, and Penicillins    Review of Systems   Review of Systems  Physical Exam Updated  Vital Signs BP 123/68   Pulse 89   Temp 99.7 F (37.6 C)   Resp 16   Ht '5\' 2"'$  (1.575 m)   Wt 86.2 kg   LMP 07/15/2022   SpO2 100%   BMI 34.75 kg/m  Physical Exam Constitutional:      General: She is not in acute distress.    Appearance: She is not ill-appearing, toxic-appearing or diaphoretic.  HENT:     Head: Normocephalic and atraumatic.     Nose: Congestion present.  Eyes:     Extraocular Movements: Extraocular movements intact.     Conjunctiva/sclera: Conjunctivae normal.  Cardiovascular:     Rate and Rhythm: Regular rhythm. Tachycardia present.  Pulmonary:     Effort: Pulmonary effort is normal.     Breath sounds: Normal breath sounds.     Comments: No wheeze appreciated, however patient has persistent dry cough throughout exam Abdominal:     General: There is no distension.     Tenderness: There is no abdominal tenderness. There is no guarding or rebound.  Musculoskeletal:        General: No swelling or tenderness.     Right lower leg: No edema.     Left lower leg: No edema.  Skin:    General: Skin is warm and dry.  Neurological:     Mental Status: She is alert and oriented to person, place, and time.  Psychiatric:        Mood and Affect: Mood normal.     ED Results / Procedures / Treatments   Labs (all labs ordered are listed, but only abnormal results are displayed) Labs Reviewed  LACTIC ACID, PLASMA - Abnormal; Notable for the following components:      Result Value   Lactic Acid, Venous 2.1 (*)    All other components within normal limits  COMPREHENSIVE METABOLIC PANEL - Abnormal; Notable for the following components:   CO2 21 (*)    Glucose, Bld 124 (*)    Total Bilirubin 0.2 (*)    All other components within normal limits  RESP PANEL BY RT-PCR (RSV, FLU A&B, COVID)  [id number]  CULTURE, BLOOD (ROUTINE X 2)  CULTURE, BLOOD (ROUTINE X 2)  CBC WITH DIFFERENTIAL/PLATELET  LACTIC ACID, PLASMA  D-DIMER, QUANTITATIVE  I-STAT BETA HCG BLOOD, ED ([facility], [facility],  [facility] ONLY)    EKG None  Radiology DG Chest 2 View  Result Date: 08/04/2022 CLINICAL DATA:  Short of breath.  Cough.  Recent COVID infection. EXAM: CHEST - 2 VIEW COMPARISON:  12/28/2021. FINDINGS: Cardiac silhouette normal in size and configuration. Normal mediastinal and hilar contours. Clear lungs.  No pleural effusion or pneumothorax. Stable right anterior chest wall Port-A-Cath. Skeletal structures are unremarkable. IMPRESSION: No active cardiopulmonary disease. Electronically Signed   By: Lajean Manes M.D.   On: 08/04/2022 15:49    Procedures Procedures    Medications Ordered in ED Medications  ketorolac (TORADOL) 15 MG/ML injection 15  mg (15 mg Intravenous Given 08/04/22 1641)  ipratropium-albuterol (DUONEB) 0.5-2.5 (3) MG/3ML nebulizer solution 3 mL (3 mLs Nebulization Given 08/04/22 1640)  sodium chloride 0.9 % bolus 1,000 mL (1,000 mLs Intravenous New Bag/Given 08/04/22 1642)    ED Course/ Medical Decision Making/ A&P Clinical Course as of 08/04/22 1826  Sun Aug 04, 6725  5440 31 year old female with history of asthma, remote history of lymphoma no longer on chemotherapy or treatment who presented with cough and shortness of breath.  Patient presented mildly tachycardic but suspect likely secondary to beta agonist use prior to presenting.  Overall clinically she is very well-appearing and afebrile. She is able to speak In full sentences with no hypoxia. No accessory mm use.   Her lactate was 1.1 and unlikely to be septic with normal white blood cell count 5.4.  Chest x-ray obtained personally reviewed no consolidation concern for pneumonia, no pleural effusion, no pneumothorax.  Suspect likely viral URI contributing to mild asthma exacerbation.  She received IVF, Iv toradol, 2 DuoNebs here and IV steroids with significant improvement and ambulated with no hypoxia.  She will be discharged with course of steroids and take-home albuterol inhaler and advised to follow-up with PCP return  precautions discussed.  She is in agreement with plan and safe for discharge. [VB]    Clinical Course User Index [VB] Elgie Congo, MD                           Medical Decision Making Amount and/or Complexity of Data Reviewed External Data Reviewed: radiology. Labs: ordered. Decision-making details documented in ED Course. Radiology: ordered and independent interpretation performed. Decision-making details documented in ED Course. ECG/medicine tests: ordered and independent interpretation performed. Decision-making details documented in ED Course.  Risk Prescription drug management.   Patient presents as above.  Differential diagnosis includes but is not limited to viral URI, pneumonia, asthma exacerbation, PE.  Patient's initial tachycardia may be due to her albuterol treatment immediately prior to arrival, however she does have risk factors for PE. D-dimer ordered.  Due to patient's history of asthma, persistent dry cough, DuoNebs ordered.  Toradol ordered.  Chest x-ray showed no focal consolidation or opacification, no pneumothorax, no acute bony injury.  EKG showed sinus tachycardia, regular rate 103 bpm.  Intervals and axes within normal minutes.  No ST elevations, no ST depressions, no hyperacute T waves, no peaked T waves.  No acute ischemic signs.  IV solumedrol, repeat duoneb given.  CBC with differential within normal limits.  COVID/flu/RSV negative.  hCG negative.  Initial lactic acid elevated at 2.1.  Fluid bolus ordered.  CMP with bicarb slightly low at 21, glucose 124, otherwise within normal limits.  D-dimer negative.  Ambulating pulse ox 97 to 100%.  On reevaluation, patient looks significantly improved: Oxygenating well on room air, improvement in breath sounds bilaterally, comfortable appearing.  Discussed her results with her, discussed plan for steroids and inhaler.  Encouraged to follow-up with PCP.  Return precautions given.          Final  Clinical Impression(s) / ED Diagnoses Final diagnoses:  None    Rx / DC Orders ED Discharge Orders     None         Luster Landsberg, MD 08/04/22 Valerie Roys    Elgie Congo, MD 08/04/22 2001

## 2022-08-04 NOTE — ED Notes (Signed)
Md made aware of lactic acid level.

## 2022-08-04 NOTE — ED Notes (Signed)
Pt ambulated around room with an oxygen reading staying between 97 to 100% on room air. Pt denies any symptoms and does not appear to be in any distress.

## 2022-08-05 ENCOUNTER — Encounter: Payer: Self-pay | Admitting: Internal Medicine

## 2022-08-05 ENCOUNTER — Other Ambulatory Visit (HOSPITAL_COMMUNITY): Payer: Self-pay

## 2022-08-09 LAB — CULTURE, BLOOD (ROUTINE X 2)
Culture: NO GROWTH
Culture: NO GROWTH
Special Requests: ADEQUATE
Special Requests: ADEQUATE

## 2022-08-10 ENCOUNTER — Other Ambulatory Visit (HOSPITAL_COMMUNITY): Payer: Self-pay

## 2022-08-29 ENCOUNTER — Inpatient Hospital Stay: Payer: Medicaid Other | Attending: Internal Medicine

## 2022-08-29 ENCOUNTER — Other Ambulatory Visit: Payer: Self-pay

## 2022-08-29 DIAGNOSIS — C8111 Nodular sclerosis classical Hodgkin lymphoma, lymph nodes of head, face, and neck: Secondary | ICD-10-CM | POA: Insufficient documentation

## 2022-08-29 DIAGNOSIS — Z95828 Presence of other vascular implants and grafts: Secondary | ICD-10-CM

## 2022-08-29 DIAGNOSIS — Z452 Encounter for adjustment and management of vascular access device: Secondary | ICD-10-CM | POA: Insufficient documentation

## 2022-08-29 MED ORDER — HEPARIN SOD (PORK) LOCK FLUSH 100 UNIT/ML IV SOLN
500.0000 [IU] | Freq: Once | INTRAVENOUS | Status: AC | PRN
  Administered 2022-08-29: 500 [IU]

## 2022-08-29 MED ORDER — SODIUM CHLORIDE 0.9% FLUSH
10.0000 mL | INTRAVENOUS | Status: DC | PRN
  Administered 2022-08-29: 10 mL

## 2022-09-12 NOTE — Progress Notes (Signed)
Ms. Shelburne presents for follow up of radiation completed on 09/22/2019 to her right neck/ chest area.   Pain issues, if any: abdominal pain for three years after chemo, around ovulation time Using a feeding tube?: no Weight changes, if any: gaining weight Swallowing issues, if any: none Smoking or chewing tobacco? Smoker Respiratory symptoms? None unless sick Using fluoride trays daily? none Last ENT visit was on: has not seen Other notable issues, if any: none at this time  Dr. 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  The patient has no evidence of disease below the diaphragm. She does not have a bulky disease and she has no significant unfavorable risk factors. The patient underwent systemic chemotherapy with ABVD status post 2 cycles. This was followed by involved field radiation under the care of Dr. Isidore Moos. The patient is currently on observation and she is feeling fine with no concerning complaints. She had repeat CT scan of the chest performed recently.  I personally and independently reviewed the scan and discussed the result with the patient today. Her scan showed no concerning findings for disease recurrence. I recommended for her to continue on observation with repeat CT scan of the chest in 1 year. She may be interested in removal of the Port-A-Cath but she will make her decision and call the office back if she decided to proceed with removal. She was advised to call immediately if she has any other concerning symptoms in the interval.   Vitals:   09/24/22 1401  BP: (!) 154/98  Pulse: 90  Resp: 18  Temp: 97.6 F (36.4 C)  SpO2: 100%

## 2022-09-24 ENCOUNTER — Other Ambulatory Visit (HOSPITAL_COMMUNITY): Payer: Self-pay

## 2022-09-24 ENCOUNTER — Encounter: Payer: Self-pay | Admitting: Radiation Oncology

## 2022-09-24 ENCOUNTER — Ambulatory Visit
Admission: RE | Admit: 2022-09-24 | Discharge: 2022-09-24 | Disposition: A | Discharge status: Home / Self Care | Payer: Self-pay | Source: Ambulatory Visit | Attending: Radiation Oncology | Admitting: Radiation Oncology

## 2022-09-24 ENCOUNTER — Other Ambulatory Visit: Payer: Self-pay

## 2022-09-24 ENCOUNTER — Ambulatory Visit
Admission: RE | Admit: 2022-09-24 | Discharge: 2022-09-24 | Disposition: A | Discharge status: Home / Self Care | Payer: Medicaid Other | Source: Ambulatory Visit | Attending: Radiation Oncology | Admitting: Radiation Oncology

## 2022-09-24 ENCOUNTER — Telehealth: Payer: Self-pay | Admitting: *Deleted

## 2022-09-24 ENCOUNTER — Other Ambulatory Visit: Payer: Self-pay | Admitting: Radiation Oncology

## 2022-09-24 VITALS — BP 154/98 | HR 90 | Temp 97.6°F | Resp 18 | Ht 62.0 in | Wt 199.5 lb

## 2022-09-24 DIAGNOSIS — Z1329 Encounter for screening for other suspected endocrine disorder: Secondary | ICD-10-CM

## 2022-09-24 DIAGNOSIS — C8111 Nodular sclerosis classical Hodgkin lymphoma, lymph nodes of head, face, and neck: Secondary | ICD-10-CM

## 2022-09-24 DIAGNOSIS — R5383 Other fatigue: Secondary | ICD-10-CM

## 2022-09-24 DIAGNOSIS — C8118 Nodular sclerosis classical Hodgkin lymphoma, lymph nodes of multiple sites: Secondary | ICD-10-CM

## 2022-09-24 DIAGNOSIS — Z8571 Personal history of Hodgkin lymphoma: Secondary | ICD-10-CM | POA: Insufficient documentation

## 2022-09-24 DIAGNOSIS — Z923 Personal history of irradiation: Secondary | ICD-10-CM | POA: Insufficient documentation

## 2022-09-24 LAB — TSH: TSH: 2.366 u[IU]/mL (ref 0.350–4.500)

## 2022-09-24 MED ORDER — NICOTINE 14 MG/24HR TD PT24
14.0000 mg | MEDICATED_PATCH | Freq: Every day | TRANSDERMAL | 0 refills | Status: AC
  Filled 2022-09-24: qty 14, 14d supply, fill #0

## 2022-09-24 MED ORDER — NICOTINE 7 MG/24HR TD PT24
7.0000 mg | MEDICATED_PATCH | Freq: Every day | TRANSDERMAL | 0 refills | Status: AC
  Filled 2022-09-24: qty 14, 14d supply, fill #0

## 2022-09-24 MED ORDER — NICOTINE 21 MG/24HR TD PT24
21.0000 mg | MEDICATED_PATCH | Freq: Every day | TRANSDERMAL | 2 refills | Status: AC
  Filled 2022-09-24: qty 14, 14d supply, fill #0

## 2022-09-24 NOTE — Telephone Encounter (Signed)
CALLED PATIENT TO REMIND OF LAB AND FU FOR 09-24-22, SPOKE WITH PATIENT AND SHE IS AWARE OF THESE APPTS.

## 2022-09-24 NOTE — Progress Notes (Signed)
Radiation Oncology         [phone] ________________________________  Name: Faith Pope MRN: [id number]  Date: 09/24/2022  DOB: 1991/01/19  Follow-Up Visit Note  Outpatient  CC: Pcp, No  Faith Bears, MD  Diagnosis and Prior Radiotherapy:    ICD-10-CM   1. Hodgkin's disease, nodular sclerosis of lymph nodes of head, face or neck (HCC)  C81.11 nicotine (NICODERM CQ - DOSED IN MG/24 HOURS) 14 mg/24hr patch    nicotine (NICODERM CQ - DOSED IN MG/24 HR) 7 mg/24hr patch    nicotine (NICODERM CQ - DOSED IN MG/24 HOURS) 21 mg/24hr patch    Ambulatory Referral to [facility] Nutrition      Radiation Treatment Dates: 09/08/2019 through 09/22/2019 Site Technique Total Dose (Gy) Dose per Fx (Gy) Completed Fx Beam Energies  Chest: Chest_R_Neck 3D 22/22 2 11/11 6X, 15X   CHIEF COMPLAINT: Here for follow-up and surveillance of lymphoma  Narrative:   Faith Pope presents for follow up of radiation completed on 09/22/2019 to her right neck/ chest area  Pain issues, if any: abdominal pain for three years after chemo, around ovulation time Using a feeding tube?: no Weight changes, if any: gaining weight Wt Readings from Last 3 Encounters:  09/24/22 199 lb 8 oz (90.5 kg)  08/04/22 190 lb (86.2 kg)  12/28/21 191 lb (86.6 kg)  Swallowing issues, if any: none Smoking or chewing tobacco? Smoker Respiratory symptoms? None unless sick Using fluoride trays daily? none Last ENT visit was on: has not seen Other notable issues, if any: none at this time  ALLERGIES:  is allergic to other, shellfish allergy, amoxicillin, and penicillins.  Meds: Current Outpatient Medications  Medication Sig Dispense Refill   acetaminophen (TYLENOL) 650 MG CR tablet Take 650 mg by mouth every 8 (eight) hours as needed for pain.     albuterol (VENTOLIN HFA) 108 (90 Base) MCG/ACT inhaler Inhale 1-2 puffs into the lungs every 6 (six) hours as needed for wheezing or shortness of breath.     albuterol (VENTOLIN HFA) 108  (90 Base) MCG/ACT inhaler Inhale 2-4 puffs into the lungs every 4 (four) hours as needed for wheezing or shortness of breath. 18 g 0   aspirin 500 MG buffered tablet Take 500 mg by mouth every 6 (six) hours as needed for pain.     lidocaine-prilocaine (EMLA) cream APPLY TOPICALLY AS NEEDED AS DIRECTED 30 g 0   albuterol (PROVENTIL) (2.5 MG/3ML) 0.083% nebulizer solution INHALE 1 VIAL VIA NEBULIZER EVERY 6 HOURS AS NEEDED FOR WHEEZING OR SHORTNESS OF BREATH 75 mL 12   aspirin-acetaminophen-caffeine (EXCEDRIN MIGRAINE) 250-250-65 MG tablet Take 2 tablets by mouth every 6 (six) hours as needed for headache. (Patient not taking: Reported on 09/24/2022)     benzonatate (TESSALON) 100 MG capsule Take 1 capsule (100 mg total) by mouth every 8 (eight) hours. (Patient not taking: Reported on 09/24/2022) 21 capsule 0   clotrimazole-betamethasone (LOTRISONE) cream Apply topically 2 times daily. (Patient not taking: Reported on 09/24/2022) 30 g 0   doxycycline (VIBRAMYCIN) 100 MG capsule Take 1 capsule (100 mg total) by mouth 2 (two) times daily. (Patient not taking: Reported on 09/24/2022) 14 capsule 0   methylPREDNISolone (MEDROL DOSEPAK) 4 MG TBPK tablet Use as directed (Patient not taking: Reported on 09/24/2022) 21 tablet 0   nicotine (NICODERM CQ - DOSED IN MG/24 HOURS) 14 mg/24hr patch Place 1 patch (14 mg total) onto the skin daily. Apply 21 mg patch daily for 6 weeks, then appy the '14mg'$  patch  daily for 2 weeks, then apply the 7 mg patch daily for 2 weeks. 14 patch 0   nicotine (NICODERM CQ - DOSED IN MG/24 HOURS) 21 mg/24hr patch Place 1 patch (21 mg total) onto the skin daily. Apply the 21 mg patch daily for 6 weeks, then appy the '14mg'$  patch daily for 2 weeks, then apply the 7 mg patch daily for 2 weeks. 14 patch 2   nicotine (NICODERM CQ - DOSED IN MG/24 HR) 7 mg/24hr patch Place 1 patch (7 mg total) onto the skin daily. **Apply 21 mg patch daily for 6 weeks, then appy the '14mg'$  patch daily for 2 weeks, then  apply the 7 mg patch daily for 2 weeks.** 14 patch 0   sulfamethoxazole-trimethoprim (BACTRIM DS) 800-160 MG tablet Take 1 tablet by mouth 2 (two) times daily for 10 days (Patient not taking: Reported on 09/24/2022) 20 tablet 0   No current facility-administered medications for this encounter.    Physical Findings: The patient is in no acute distress. Patient is alert and oriented.  height is '5\' 2"'$  (1.575 m) and weight is 199 lb 8 oz (90.5 kg). Her temporal temperature is 97.6 F (36.4 C). Her blood pressure is 154/98 (abnormal) and her pulse is 90. Her respiration is 18 and oxygen saturation is 100%. Marland Kitchen    NECK: No palpable adenopathy in the cervical or supraclavicular neck HEENT: Mouth and upper throat are clear Heart: Regular in rate and rhythm Chest clear to auscultation bilaterally Lymphatics: No palpable masses in the axillary regions bilaterally Skin: Satisfactory healing of skin in the radiation treatment fields MSK: Normal strength and muscle tone throughout.  Psych: affect is appropriate, alert an oriented. Insight intact.  Lab Findings: Lab Results  Component Value Date   WBC 5.4 08/04/2022   HGB 12.5 08/04/2022   HCT 38.2 08/04/2022   MCV 88.6 08/04/2022   PLT 318 08/04/2022    Radiographic Findings: I personally reviewed the radiological findings.    Impression/Plan: No evidence of disease on physical exam.  She has been followed with posttreatment CT imaging in medical oncology. CT scan scheduled for 12/16/22  TSH was obtained today.Awaiting results. Will follow-up with patient if the results came back abnormal.  Lab Results  Component Value Date   TSH 2.366 09/24/2022   I will see her back in a year with TSH testing and physical exam.  She will continue to follow closely with medical oncology. Chest CT scan scheduled for April, 2024.   Patient would like help with weight loss. Nutrition referral made today.    I asked the patient today about tobacco use. The  patient uses tobacco.  I advised the patient to quit. Services were offered by me today including outpatient counseling and pharmacotherapy. I assessed for the willingness to attempt to quit and provided encouragement and demonstrated willingness to make referrals and/or prescriptions to help the patient attempt to quit. Over 5 minutes were spent on this issue. Nicotine Patches Rx'd.   On date of service, in total, I spent 30 minutes on this encounter; she was seen in person  _____________________________________   Leona Singleton, PA    Eppie Gibson, MD

## 2022-09-25 ENCOUNTER — Other Ambulatory Visit: Payer: Self-pay

## 2022-09-25 ENCOUNTER — Other Ambulatory Visit: Payer: Self-pay | Admitting: Radiology

## 2022-09-25 ENCOUNTER — Other Ambulatory Visit (HOSPITAL_COMMUNITY): Payer: Self-pay

## 2022-09-25 DIAGNOSIS — Z1329 Encounter for screening for other suspected endocrine disorder: Secondary | ICD-10-CM

## 2022-09-25 DIAGNOSIS — R635 Abnormal weight gain: Secondary | ICD-10-CM

## 2022-09-27 ENCOUNTER — Other Ambulatory Visit: Payer: Self-pay

## 2022-10-03 ENCOUNTER — Encounter: Payer: Medicaid Other | Admitting: Nutrition

## 2022-10-04 ENCOUNTER — Other Ambulatory Visit: Payer: Self-pay | Admitting: Radiology

## 2022-10-04 DIAGNOSIS — R635 Abnormal weight gain: Secondary | ICD-10-CM

## 2022-11-08 ENCOUNTER — Ambulatory Visit: Payer: Medicaid Other | Admitting: Dietician

## 2022-12-16 ENCOUNTER — Other Ambulatory Visit: Payer: Self-pay

## 2022-12-16 ENCOUNTER — Inpatient Hospital Stay: Payer: Self-pay | Attending: Internal Medicine

## 2022-12-16 ENCOUNTER — Ambulatory Visit (HOSPITAL_COMMUNITY)
Admission: RE | Admit: 2022-12-16 | Discharge: 2022-12-16 | Disposition: A | Discharge status: Home / Self Care | Payer: Medicaid Other | Source: Ambulatory Visit | Attending: Internal Medicine | Admitting: Internal Medicine

## 2022-12-16 DIAGNOSIS — C8111 Nodular sclerosis classical Hodgkin lymphoma, lymph nodes of head, face, and neck: Secondary | ICD-10-CM | POA: Insufficient documentation

## 2022-12-16 DIAGNOSIS — C8118 Nodular sclerosis classical Hodgkin lymphoma, lymph nodes of multiple sites: Secondary | ICD-10-CM | POA: Insufficient documentation

## 2022-12-16 LAB — CMP (CANCER CENTER ONLY)
ALT: 18 U/L (ref 0–44)
AST: 18 U/L (ref 15–41)
Albumin: 4.4 g/dL (ref 3.5–5.0)
Alkaline Phosphatase: 57 U/L (ref 38–126)
Anion gap: 7 (ref 5–15)
BUN: 19 mg/dL (ref 6–20)
CO2: 26 mmol/L (ref 22–32)
Calcium: 9.6 mg/dL (ref 8.9–10.3)
Chloride: 106 mmol/L (ref 98–111)
Creatinine: 0.76 mg/dL (ref 0.44–1.00)
GFR, Estimated: >60 mL/min (ref >60)
Glucose, Bld: 90 mg/dL (ref 70–99)
Potassium: 4.3 mmol/L (ref 3.5–5.1)
Sodium: 139 mmol/L (ref 135–145)
Total Bilirubin: 0.3 mg/dL (ref 0.3–1.2)
Total Protein: 7.6 g/dL (ref 6.5–8.1)

## 2022-12-16 LAB — CBC WITH DIFFERENTIAL (CANCER CENTER ONLY)
Abs Immature Granulocytes: 0.02 10*3/uL (ref 0.00–0.07)
Basophils Absolute: 0 10*3/uL (ref 0.0–0.1)
Basophils Relative: 1 %
Eosinophils Absolute: 0.1 10*3/uL (ref 0.0–0.5)
Eosinophils Relative: 2 %
HCT: 39.8 % (ref 36.0–46.0)
Hemoglobin: 13 g/dL (ref 12.0–15.0)
Immature Granulocytes: 0 %
Lymphocytes Relative: 40 %
Lymphs Abs: 2.5 10*3/uL (ref 0.7–4.0)
MCH: 28.1 pg (ref 26.0–34.0)
MCHC: 32.7 g/dL (ref 30.0–36.0)
MCV: 86.1 fL (ref 80.0–100.0)
Monocytes Absolute: 0.5 10*3/uL (ref 0.1–1.0)
Monocytes Relative: 8 %
Neutro Abs: 3.1 10*3/uL (ref 1.7–7.7)
Neutrophils Relative %: 49 %
Platelet Count: 267 10*3/uL (ref 150–400)
RBC: 4.62 MIL/uL (ref 3.87–5.11)
RDW: 13.5 % (ref 11.5–15.5)
WBC Count: 6.3 10*3/uL (ref 4.0–10.5)
nRBC: 0 % (ref 0.0–0.2)

## 2022-12-16 MED ORDER — IOHEXOL 300 MG/ML  SOLN
75.0000 mL | Freq: Once | INTRAMUSCULAR | Status: AC | PRN
  Administered 2022-12-16: 75 mL via INTRAVENOUS

## 2022-12-18 ENCOUNTER — Inpatient Hospital Stay (HOSPITAL_BASED_OUTPATIENT_CLINIC_OR_DEPARTMENT_OTHER): Payer: Medicaid Other | Admitting: Internal Medicine

## 2022-12-18 VITALS — BP 132/85 | HR 80 | Temp 98.4°F | Resp 16 | Wt 202.4 lb

## 2022-12-18 DIAGNOSIS — C8111 Nodular sclerosis classical Hodgkin lymphoma, lymph nodes of head, face, and neck: Secondary | ICD-10-CM

## 2022-12-18 NOTE — Progress Notes (Signed)
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  PRIOR THERAPY:   1) Systemic chemotherapy with ABVD on days 1 and 15 every 4 weeks.  First dose June 23, 2019.  She is status post 2 cycles.  2) involved field radiotherapy under the care of Dr. Basilio Cairo  CURRENT THERAPY: Observation.  INTERVAL HISTORY: Faith Pope 32 y.o. female returns to the clinic today for follow-up visit.  The patient is feeling fine today with no concerning complaints.  She denied having any chest pain, shortness of breath, cough or hemoptysis.  She has no nausea, vomiting, diarrhea or constipation.  She has no headache or visual changes.  She has no palpable lymphadenopathy.  She is here today for evaluation with repeat CT scan of the chest for restaging of her disease.  MEDICAL HISTORY: Past Medical History:  Diagnosis Date   Asthma    Cancer (HCC)    Hodgkin Lymphoma diagnosed 04/2019   History of radiation therapy 09/08/19- 09/22/19   Right neck/ chest 11 fractions of 2 Gy each to total 22 Gy.     ALLERGIES:  is allergic to other, shellfish allergy, amoxicillin, and penicillins.  MEDICATIONS:  Current Outpatient Medications  Medication Sig Dispense Refill   acetaminophen (TYLENOL) 650 MG CR tablet Take 650 mg by mouth every 8 (eight) hours as needed for pain.     albuterol (PROVENTIL) (2.5 MG/3ML) 0.083% nebulizer solution INHALE 1 VIAL VIA NEBULIZER EVERY 6 HOURS AS NEEDED FOR WHEEZING OR SHORTNESS OF BREATH 75 mL 12   albuterol (VENTOLIN HFA) 108 (90 Base) MCG/ACT inhaler Inhale 1-2 puffs into the lungs every 6 (six) hours as needed for wheezing or shortness of breath.     albuterol (VENTOLIN HFA) 108 (90 Base) MCG/ACT inhaler Inhale 2-4 puffs into the lungs every 4 (four) hours as needed for wheezing or shortness of breath. 18 g 0    aspirin 500 MG buffered tablet Take 500 mg by mouth every 6 (six) hours as needed for pain.     aspirin-acetaminophen-caffeine (EXCEDRIN MIGRAINE) 250-250-65 MG tablet Take 2 tablets by mouth every 6 (six) hours as needed for headache. (Patient not taking: Reported on 09/24/2022)     benzonatate (TESSALON) 100 MG capsule Take 1 capsule (100 mg total) by mouth every 8 (eight) hours. (Patient not taking: Reported on 09/24/2022) 21 capsule 0   clotrimazole-betamethasone (LOTRISONE) cream Apply topically 2 times daily. (Patient not taking: Reported on 09/24/2022) 30 g 0   doxycycline (VIBRAMYCIN) 100 MG capsule Take 1 capsule (100 mg total) by mouth 2 (two) times daily. (Patient not taking: Reported on 09/24/2022) 14 capsule 0   methylPREDNISolone (MEDROL DOSEPAK) 4 MG TBPK tablet Use as directed (Patient not taking: Reported on 09/24/2022) 21 tablet 0   nicotine (NICODERM CQ - DOSED IN MG/24 HOURS) 14 mg/24hr patch Place 1 patch (14 mg total) onto the skin daily. Apply 21 mg patch daily for 6 weeks, then appy the  patch daily for 2 weeks, then apply the 7 mg patch daily for 2 weeks. 14 patch 0   nicotine (NICODERM CQ - DOSED IN MG/24 HOURS) 21 mg/24hr patch Place 1 patch (21 mg total) onto the skin daily. Apply the 21 mg patch daily for 6 weeks, then appy the  patch daily for 2 weeks, then apply the 7 mg patch daily  for 2 weeks. 14 patch 2   nicotine (NICODERM CQ - DOSED IN MG/24 HR) 7 mg/24hr patch Place 1 patch (7 mg total) onto the skin daily. **Apply 21 mg patch daily for 6 weeks, then appy the 14mg  patch daily for 2 weeks, then apply the 7 mg patch daily for 2 weeks.** 14 patch 0   sulfamethoxazole-trimethoprim (BACTRIM DS) 800-160 MG tablet Take 1 tablet by mouth 2 (two) times daily for 10 days (Patient not taking: Reported on 09/24/2022) 20 tablet 0   No current facility-administered medications for this visit.    SURGICAL HISTORY:  Past Surgical History:  Procedure Laterality Date   IR  BONE MARROW BIOPSY & ASPIRATION  06/21/2019   IR IMAGING GUIDED PORT INSERTION  06/21/2019   MASS BIOPSY Right 05/11/2019   Procedure: EXCISIONAL BIOPSY OF RIGHT CERVICAL LYMPH NODE;  Surgeon: Christia Reading, MD;  Location: [facility] OR;  Service: ENT;  Laterality: Right;   TONSILLECTOMY      REVIEW OF SYSTEMS:  A comprehensive review of systems was negative.   PHYSICAL EXAMINATION: General appearance: alert, cooperative, and no distress Head: Normocephalic, without obvious abnormality, atraumatic Neck: no adenopathy, no JVD, supple, symmetrical, trachea midline, and thyroid not enlarged, symmetric, no tenderness/mass/nodules Lymph nodes: Cervical, supraclavicular, and axillary nodes normal. Resp: clear to auscultation bilaterally Back: negative, no kyphosis present, symmetric, no curvature. ROM normal. No CVA tenderness. Cardio: regular rate and rhythm, S1, S2 normal, no murmur, click, rub or gallop GI: soft, non-tender; bowel sounds normal; no masses,  no organomegaly Extremities: extremities normal, atraumatic, no cyanosis or edema  ECOG PERFORMANCE STATUS: 0 - Asymptomatic  Blood pressure 132/85, pulse 80, temperature 98.4 F (36.9 C), temperature source Oral, resp. rate 16, weight 202 lb 6.4 oz (91.8 kg), SpO2 100 %.  LABORATORY DATA: Lab Results  Component Value Date   WBC 6.3 12/16/2022   HGB 13.0 12/16/2022   HCT 39.8 12/16/2022   MCV 86.1 12/16/2022   PLT 267 12/16/2022      Chemistry      Component Value Date/Time   NA 139 12/16/2022 1007   K 4.3 12/16/2022 1007   CL 106 12/16/2022 1007   CO2 26 12/16/2022 1007   BUN 19 12/16/2022 1007   CREATININE 0.76 12/16/2022 1007      Component Value Date/Time   CALCIUM 9.6 12/16/2022 1007   ALKPHOS 57 12/16/2022 1007   AST 18 12/16/2022 1007   ALT 18 12/16/2022 1007   BILITOT 0.3 12/16/2022 1007       RADIOGRAPHIC STUDIES: CT Chest W Contrast  Result Date: 12/18/2022 CLINICAL DATA:  Hodgkin lymphoma.  * Tracking Code:  BO * EXAM: CT CHEST WITH CONTRAST TECHNIQUE: Multidetector CT imaging of the chest was performed during intravenous contrast administration. RADIATION DOSE REDUCTION: This exam was performed according to the departmental dose-optimization program which includes automated exposure control, adjustment of the mA and/or kV according to patient size and/or use of iterative reconstruction technique. CONTRAST:  75mL OMNIPAQUE IOHEXOL 300 MG/ML  SOLN COMPARISON:  12/14/2021. FINDINGS: Cardiovascular: Right IJ Port-A-Cath terminates in the right atrium. Heart is at the upper limits of normal in size. No pericardial effusion. Mediastinum/Nodes: No thoracic inlet adenopathy. Mediastinal lymph nodes measure up to 1.2 cm in the prevascular space, unchanged from 12/14/2021. Soft tissue with fatty stippling in the prevascular space conforms to the shape of the thymus, as before. Bihilar lymphoid tissue without discrete adenopathy. No axillary adenopathy. Esophagus is grossly unremarkable. Lungs/Pleura: Smoking related respiratory bronchiolitis. Scattered  pulmonary parenchymal scarring. Pulmonary nodules measure 4 mm or less in size, unchanged. Bronchiectasis with chronic mucoid impaction in the medial right lower lobe, unchanged. No pleural fluid. Airway is unremarkable. Upper Abdomen: Visualized portions of the liver, gallbladder, adrenal glands, kidneys, spleen, pancreas, stomach and bowel are grossly unremarkable. No upper abdominal adenopathy. Musculoskeletal: None. IMPRESSION: Stable borderline mediastinal adenopathy. No evidence of progressive disease. Electronically Signed   By: Leanna Battles M.D.   On: 12/18/2022 08:46     ASSESSMENT AND PLAN: This is a very pleasant 32 years old African-American female recently diagnosed with a stage IIA Hodgkin lymphoma, nodular sclerosing subtype diagnosed in October 2020 and presented with right cervical and supraclavicular lymphadenopathy as well as right mediastinal  lymphadenopathy.  The patient has no evidence of disease below the diaphragm. She does not have a bulky disease and she has no significant unfavorable risk factors. The patient underwent systemic chemotherapy with ABVD status post 2 cycles. This was followed by involved field radiation under the care of Dr. Basilio Cairo. The patient has been on observation now for several years and she is doing fine with no concerning complaints. She had repeat CT scan of the chest performed recently.  I discussed the scan results with the patient and there is no concerning findings for disease recurrence or progression. I recommended for her to continue on observation with repeat CT scan of the chest in 1 year. I will refer the patient to interventional radiology for removal of the Port-A-Cath. The patient was advised to call immediately if she has any other concerning symptoms in the interval. The patient voices understanding of current disease status and treatment options and is in agreement with the current care plan.  All questions were answered. The patient knows to call the clinic with any problems, questions or concerns. We can certainly see the patient much sooner if necessary.   Disclaimer: This note was dictated with voice recognition software. Similar sounding words can inadvertently be transcribed and may not be corrected upon review.

## 2022-12-19 ENCOUNTER — Other Ambulatory Visit: Payer: Self-pay

## 2022-12-30 IMAGING — CT CT CHEST W/ CM
2 of 4 series · 14 of 36 positions shown, 17 images · IV contrast (agent unspecified)
Comparison: Chest CT dated February 05, 2021

CLINICAL DATA: Hodgkin's disease; * Tracking Code: BO *

EXAM:
CT CHEST WITH CONTRAST
TECHNIQUE: Multidetector CT imaging of the chest was performed during
intravenous contrast administration.

[Series 2: axial st · axial · 0.98mm/px · z∈[-95,+141]mm · 11 of 140 slices shown, 14 images]
[im 11/140  mediastinal]
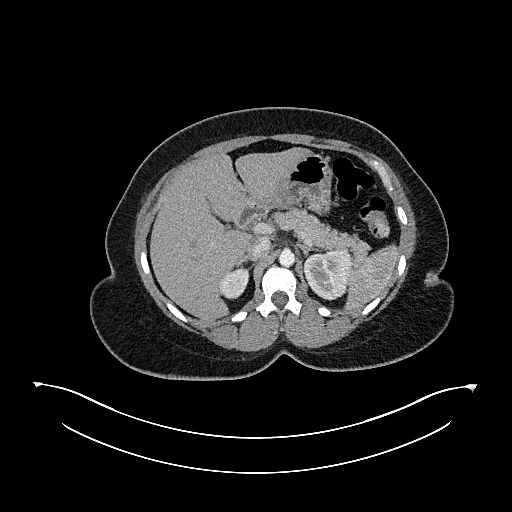
[im 11/140  lung]
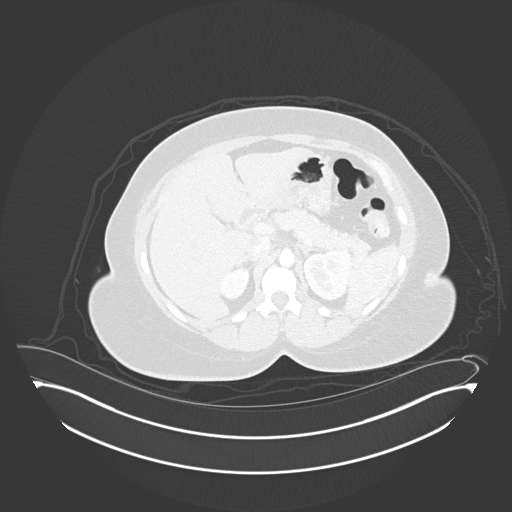
[im 22/140  lung]
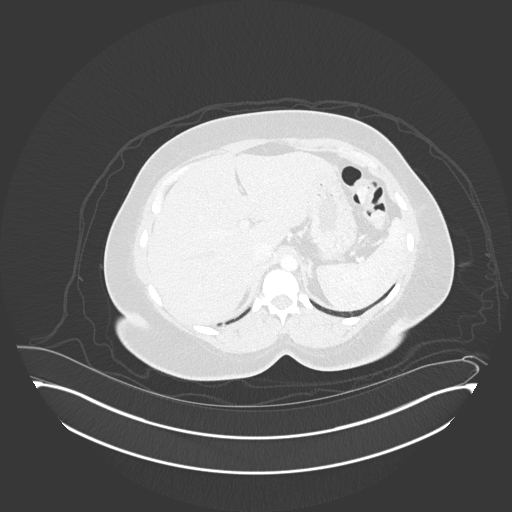
[im 33/140  lung]
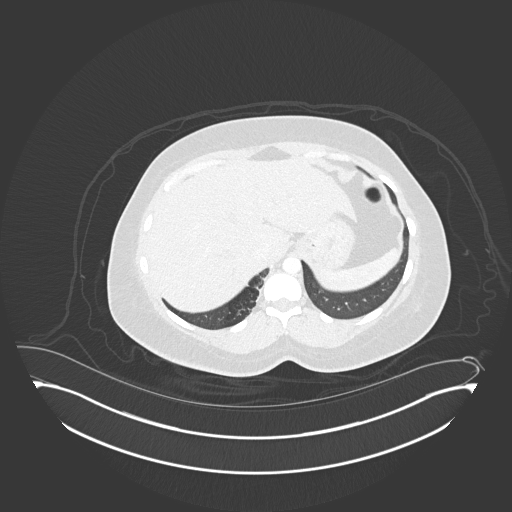
[im 43/140  lung]
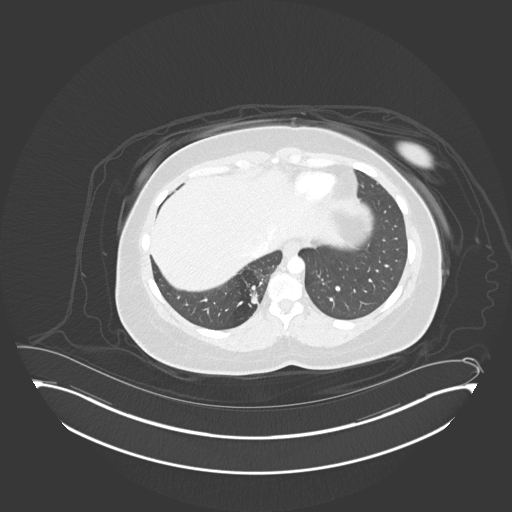
[im 54/140  mediastinal]
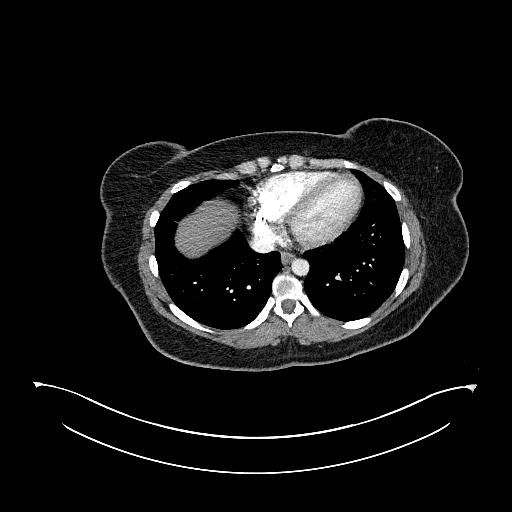
[im 54/140  lung]
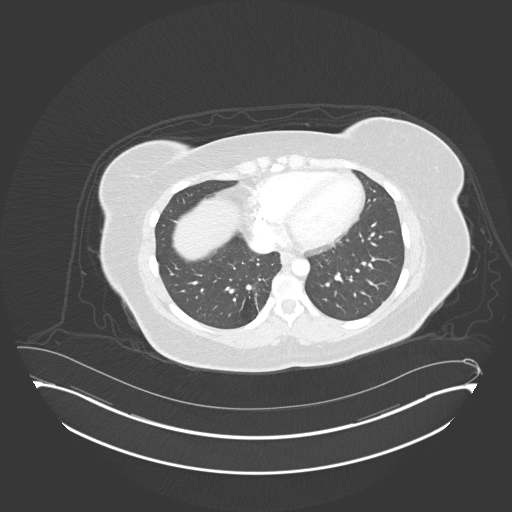
[im 75/140  lung]
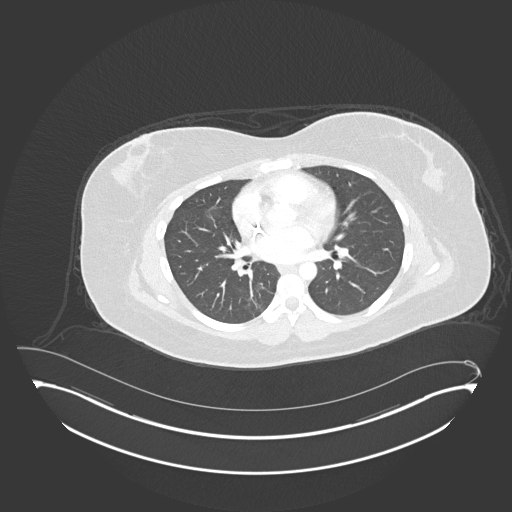
[im 86/140  lung]
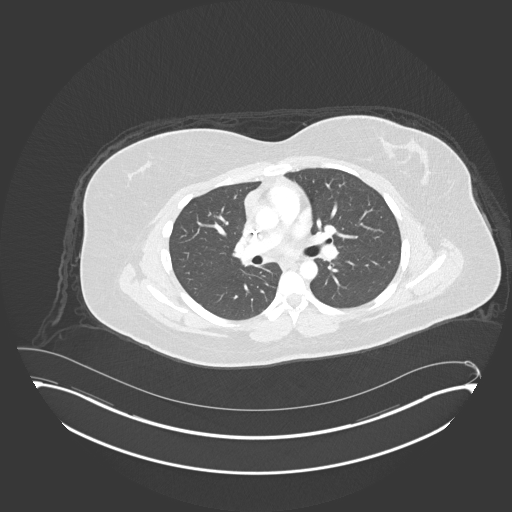
[im 97/140  lung]
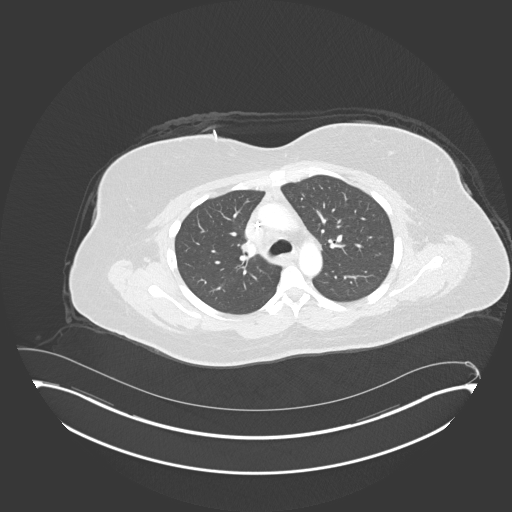
[im 107/140  mediastinal]
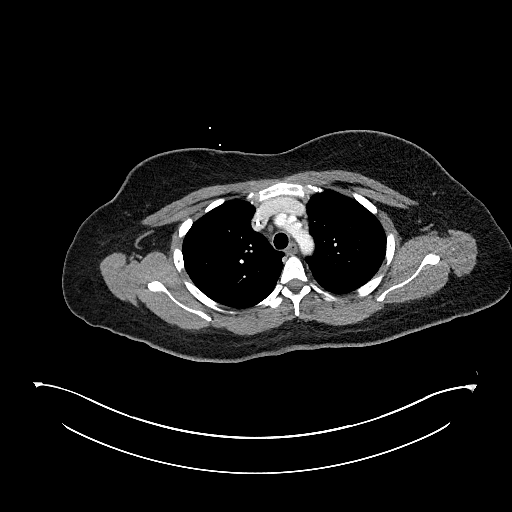
[im 107/140  lung]
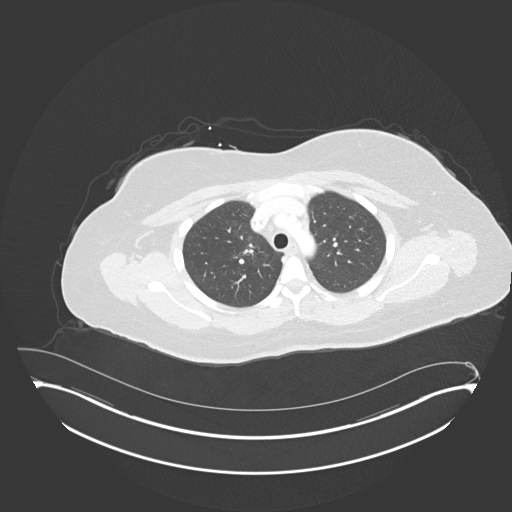
[im 118/140  lung]
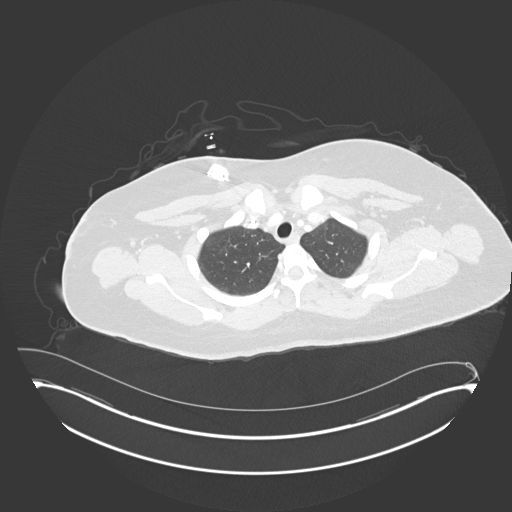
[im 129/140  lung]
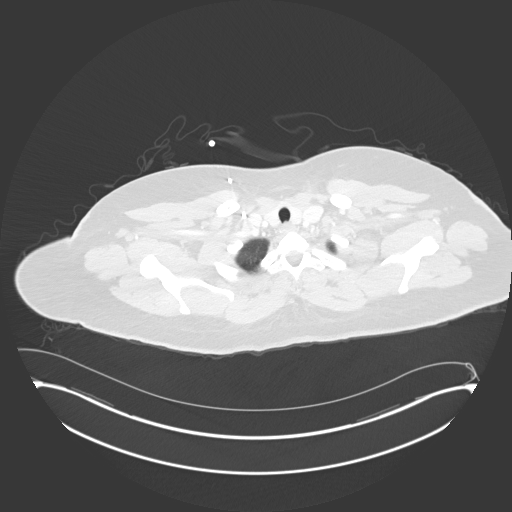

[Series 5: coronal · coronal · 0.57mm/px · 3 of 145 slices shown]
[im 29/145  lung]
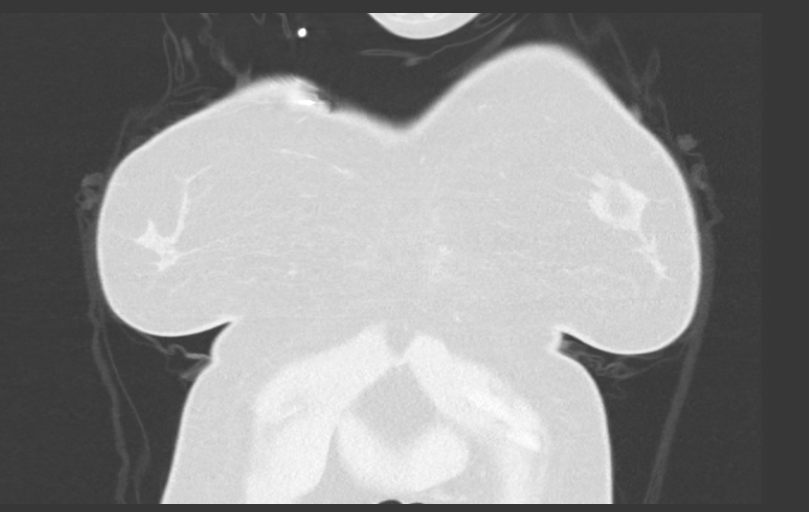
[im 58/145  lung]
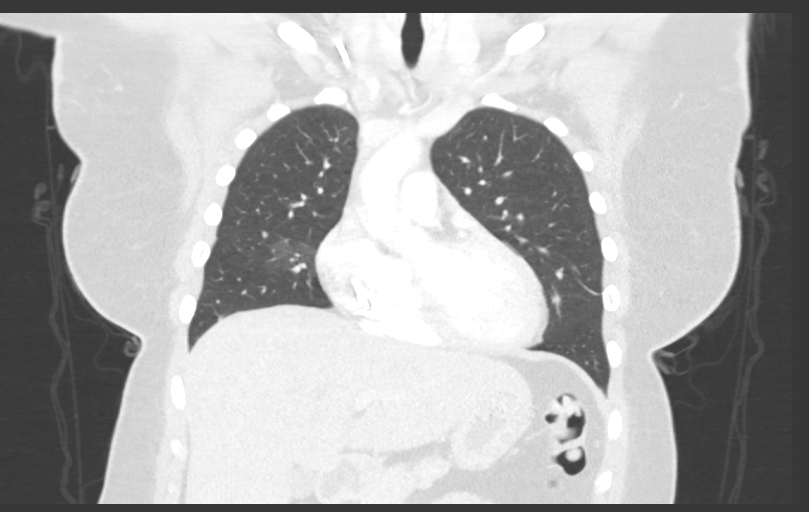
[im 87/145  lung]
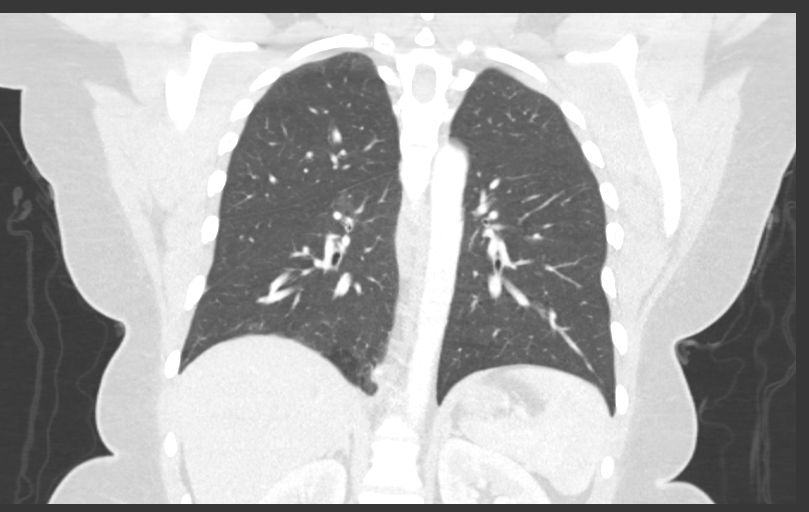

[14 of 36 positions shown; findings below may reference images not displayed]

RADIATION DOSE REDUCTION: This exam was performed according to the
departmental dose-optimization program which includes automated
exposure control, adjustment of the mA and/or kV according to
patient size and/or use of iterative reconstruction technique.

CONTRAST:  75mL OMNIPAQUE IOHEXOL 300 MG/ML  SOLN
FINDINGS: Cardiovascular: Normal heart size. No pericardial effusion. No
significant vascular findings. Right chest wall port with tip
positioned in the right atrium.

Mediastinum/Nodes: Esophagus and thyroid are unremarkable. Anterior
mediastinal soft tissue with more focal nodular component measuring
0.8 x 1.4 cm on series 2, image 55, unchanged when compared to prior
exam. Stable mildly enlarged 1.1 mm right lower paratracheal lymph
node located on series 2, image 38. Stable prominent 9 mm right
hilar lymph node located on series 2, image 51. No pathologically
enlarged lymph nodes seen in the chest.

Lungs/Pleura: Central airways are patent. New tubular right lower
lobe opacity at site of previously seen focal bronchiectasis,
compatible with mucocele. No pleural effusion or pneumothorax.

Upper Abdomen: No acute abnormality.

Musculoskeletal: No chest wall abnormality. No acute or significant
osseous findings.
IMPRESSION: 1. Stable soft tissue of the anterior mediastinum.
2. Unchanged prominent mediastinal and hilar lymph nodes.
3. No evidence of lymphadenopathy in the chest.
4. New tubular right lower lobe opacity at site of previously seen
focal bronchiectasis, compatible with mucocele.

## 2023-01-02 ENCOUNTER — Other Ambulatory Visit (HOSPITAL_COMMUNITY): Payer: Medicaid Other

## 2023-01-07 ENCOUNTER — Ambulatory Visit (HOSPITAL_COMMUNITY)
Admission: RE | Admit: 2023-01-07 | Discharge: 2023-01-07 | Disposition: A | Discharge status: Home / Self Care | Payer: Medicaid Other | Source: Ambulatory Visit | Attending: Internal Medicine | Admitting: Internal Medicine

## 2023-01-07 DIAGNOSIS — C8111 Nodular sclerosis classical Hodgkin lymphoma, lymph nodes of head, face, and neck: Secondary | ICD-10-CM | POA: Insufficient documentation

## 2023-01-07 HISTORY — PX: IR REMOVAL TUN ACCESS W/ PORT W/O FL MOD SED: IMG2290

## 2023-01-07 MED ORDER — LIDOCAINE-EPINEPHRINE 1 %-1:100000 IJ SOLN
INTRAMUSCULAR | Status: AC
  Filled 2023-01-07: qty 1

## 2023-01-07 MED ORDER — LIDOCAINE-EPINEPHRINE 1 %-1:100000 IJ SOLN
20.0000 mL | Freq: Once | INTRAMUSCULAR | Status: AC
  Administered 2023-01-07: 10 mL

## 2023-01-07 NOTE — Procedures (Signed)
Interventional Radiology Procedure Note  Procedure: Chest port removal   Indication: Completion of chemotherapy  Findings: Please refer to procedural dictation for full description.  Complications: None  EBL: < 10 mL  [place] Nordquist, MD [phone]   

## 2023-01-14 ENCOUNTER — Other Ambulatory Visit: Payer: Self-pay | Admitting: Radiology

## 2023-01-14 DIAGNOSIS — C8110 Nodular sclerosis classical Hodgkin lymphoma, unspecified site: Secondary | ICD-10-CM

## 2023-01-15 ENCOUNTER — Ambulatory Visit (HOSPITAL_COMMUNITY)
Admission: RE | Admit: 2023-01-15 | Discharge: 2023-01-15 | Disposition: A | Discharge status: Home / Self Care | Payer: Medicaid Other | Source: Ambulatory Visit | Attending: Radiology | Admitting: Radiology

## 2023-01-15 ENCOUNTER — Encounter (HOSPITAL_COMMUNITY): Payer: Self-pay | Admitting: Radiology

## 2023-01-15 DIAGNOSIS — C8110 Nodular sclerosis classical Hodgkin lymphoma, unspecified site: Secondary | ICD-10-CM | POA: Insufficient documentation

## 2023-01-15 HISTORY — PX: IR PATIENT EVAL TECH 0-60 MINS: IMG5564

## 2023-01-15 NOTE — Procedures (Signed)
Pt seen for attention to port site, bandaging removed to reveal poor dermabond adhesion allowing moisture to penetrate underneath. Old dermabond was delicately removed. Site was evaluated by Jeananne Rama as well and Dr. Richarda Overlie. No intervention was was necessary, no redness apparent or pain at site. Pt was given several large regular bandages and encouraged to keep it dry and covered but allow air to encourage healing.

## 2023-02-03 ENCOUNTER — Encounter: Payer: Self-pay | Admitting: Medical Oncology

## 2023-02-03 ENCOUNTER — Encounter: Payer: Self-pay | Admitting: Internal Medicine

## 2023-02-04 ENCOUNTER — Other Ambulatory Visit: Payer: Self-pay | Admitting: Medical Oncology

## 2023-02-04 ENCOUNTER — Encounter: Payer: Self-pay | Admitting: Internal Medicine

## 2023-02-04 DIAGNOSIS — Z9889 Other specified postprocedural states: Secondary | ICD-10-CM

## 2023-02-05 ENCOUNTER — Other Ambulatory Visit: Payer: Self-pay | Admitting: Internal Medicine

## 2023-02-05 ENCOUNTER — Encounter (HOSPITAL_COMMUNITY): Payer: Self-pay | Admitting: Radiology

## 2023-02-05 ENCOUNTER — Ambulatory Visit (HOSPITAL_COMMUNITY)
Admission: RE | Admit: 2023-02-05 | Discharge: 2023-02-05 | Disposition: A | Discharge status: Home / Self Care | Payer: Medicaid Other | Source: Ambulatory Visit | Attending: Internal Medicine | Admitting: Internal Medicine

## 2023-02-05 DIAGNOSIS — Z9889 Other specified postprocedural states: Secondary | ICD-10-CM

## 2023-02-05 HISTORY — PX: IR PATIENT EVAL TECH 0-60 MINS: IMG5564

## 2023-02-05 NOTE — Procedures (Signed)
Pt s/p right chest port removal on 01/07/23; presents today with exposed portion of suture medial aspect of wound; denies pain at site, fever/chills; no erythema or drainage from site; exposed portion of suture cut; rec bacitracin ointment to site x 3 days; avoid submerging wound in water until scab formation occurs; f/u eval in 1 week; Dr. Lowella Dandy updated

## 2023-02-05 NOTE — Progress Notes (Signed)
Patient ID: Faith Pope, female   DOB: 1990/12/01, 32 y.o.   MRN: [id number] Pt s/p right chest port removal on 01/07/23; presents today with exposed portion of suture medial aspect of wound; denies pain at site, fever/chills; no erythema or drainage from site; exposed portion of suture cut; rec bacitracin ointment to site x 3 days; avoid submerging wound in water until scab formation occurs; f/u eval in 1 week; Dr. Lowella Dandy updated

## 2023-02-11 ENCOUNTER — Inpatient Hospital Stay (HOSPITAL_COMMUNITY)
Admission: RE | Admit: 2023-02-11 | Discharge: 2023-02-11 | Disposition: A | Discharge status: Home / Self Care | Payer: Medicaid Other | Source: Ambulatory Visit | Attending: Internal Medicine | Admitting: Internal Medicine

## 2023-08-07 ENCOUNTER — Encounter: Payer: Self-pay | Admitting: Internal Medicine

## 2023-08-07 ENCOUNTER — Other Ambulatory Visit (HOSPITAL_COMMUNITY): Payer: Self-pay

## 2023-08-07 MED ORDER — METHYLPREDNISOLONE 4 MG PO TBPK
ORAL_TABLET | ORAL | 0 refills | Status: AC
  Filled 2023-08-07: qty 21, 6d supply, fill #0

## 2023-08-08 ENCOUNTER — Other Ambulatory Visit (HOSPITAL_COMMUNITY): Payer: Self-pay

## 2023-09-22 NOTE — Progress Notes (Signed)
Ms. Dalporto presents for follow up of radiation completed on 09/22/2019 to her right neck/chest area.   Pain issues, if any: Lower abdominal pain. Patient says she went to urgent care and was diagnosed with oviarian cysts.  Using a feeding tube?: N/A Weight changes, if any None. Weight has been steady Swallowing issues, if any: None Cough/SOB: Yes. Patient says she still smokes, encouraged smoking cessation. Smoking or chewing tobacco? None Using fluoride trays daily? N/A Last ENT visit was on: Following up PRN Other notable issues, if any: Last saw medical oncologist Dr. Arbutus Ped on 12/18/2022 for routine follow-up and to review surveillance scans BP (!) 138/97 (BP Location: Right Arm, Patient Position: Sitting, Cuff Size: Large)   Pulse 88   Temp 97.8 F (36.6 C)   Resp 18   Ht 5\' 2"  (1.575 m)   Wt 199 lb (90.3 kg)   SpO2 100%   BMI 36.40 kg/m    Filed Weights   09/23/23 1426  Weight: 199 lb (90.3 kg)

## 2023-09-23 ENCOUNTER — Encounter: Payer: Self-pay | Admitting: Radiation Oncology

## 2023-09-23 ENCOUNTER — Ambulatory Visit
Admission: RE | Admit: 2023-09-23 | Discharge: 2023-09-23 | Disposition: A | Discharge status: Home / Self Care | Payer: Medicaid Other | Source: Ambulatory Visit | Attending: Radiation Oncology | Admitting: Radiation Oncology

## 2023-09-23 ENCOUNTER — Telehealth: Payer: Self-pay | Admitting: *Deleted

## 2023-09-23 ENCOUNTER — Other Ambulatory Visit (HOSPITAL_COMMUNITY): Payer: Self-pay

## 2023-09-23 VITALS — BP 138/97 | HR 88 | Temp 97.8°F | Resp 18 | Ht 62.0 in | Wt 199.0 lb

## 2023-09-23 DIAGNOSIS — Z1329 Encounter for screening for other suspected endocrine disorder: Secondary | ICD-10-CM | POA: Diagnosis present

## 2023-09-23 DIAGNOSIS — J45901 Unspecified asthma with (acute) exacerbation: Secondary | ICD-10-CM

## 2023-09-23 DIAGNOSIS — C8111 Nodular sclerosis classical Hodgkin lymphoma, lymph nodes of head, face, and neck: Secondary | ICD-10-CM

## 2023-09-23 LAB — TSH: TSH: 3.218 u[IU]/mL (ref 0.350–4.500)

## 2023-09-23 MED ORDER — ALBUTEROL SULFATE (2.5 MG/3ML) 0.083% IN NEBU
INHALATION_SOLUTION | RESPIRATORY_TRACT | 4 refills | Status: DC
  Filled 2023-09-23: qty 90, 7d supply, fill #0

## 2023-09-23 NOTE — Telephone Encounter (Signed)
Called patient to remind of lab and fu appt. for 09-23-23, spoke with patient and she is aware of these appts.

## 2023-09-23 NOTE — Progress Notes (Signed)
Radiation Oncology         [phone] ________________________________  Name: Faith Pope MRN: [id number]  Date: 09/23/2023  DOB: [birth year]  Follow-Up Visit Note  Outpatient  CC: Pcp, No  Si Gaul, MD  Diagnosis and Prior Radiotherapy:    ICD-10-CM   1. Hodgkin's disease, nodular sclerosis of lymph nodes of head, face or neck (HCC)  C81.11     2. Moderate asthma with acute exacerbation, unspecified whether persistent  J45.901 albuterol (PROVENTIL) (2.5 MG/3ML) 0.083% nebulizer solution       Radiation Treatment Dates: 09/08/2019 through 09/22/2019 Site Technique Total Dose (Gy) Dose per Fx (Gy) Completed Fx Beam Energies  Chest: Chest_R_Neck 3D 22/22 2 11/11 6X, 15X   CHIEF COMPLAINT: Here for follow-up and surveillance of lymphoma  Narrative:   Faith Pope presents for follow up of radiation completed on 09/22/2019 to her right neck/chest area  Pain issues, if any: Lower abdominal pain around ovulation. Patient says she went to urgent care and was told it may be ovarian cysts. Weight changes, if any: Patient has been struggling to lose extra weight.  Swallowing issues, if any: None Cough/SOB: Yes. Patient says she still smokes, encouraged smoking cessation.  Smoking or chewing tobacco? None Using fluoride trays daily? N/A Last ENT visit was on: Following up PRN Other notable issues, if any: Last saw medical oncologist Dr. Arbutus Ped on 12/18/2022 for routine follow-up. Her CT scan from April showed no evidence of disease recurrence at that time.   ALLERGIES:  is allergic to shellfish allergy, amoxicillin, and penicillins.  Meds: Current Outpatient Medications  Medication Sig Dispense Refill   acetaminophen (TYLENOL) 650 MG CR tablet Take 650 mg by mouth every 8 (eight) hours as needed for pain.     albuterol (VENTOLIN HFA) 108 (90 Base) MCG/ACT inhaler Inhale 1-2 puffs into the lungs every 6 (six) hours as needed for wheezing or shortness of breath.     albuterol  (VENTOLIN HFA) 108 (90 Base) MCG/ACT inhaler Inhale 2-4 puffs into the lungs every 4 (four) hours as needed for wheezing or shortness of breath. 18 g 0   aspirin 500 MG buffered tablet Take 500 mg by mouth every 6 (six) hours as needed for pain.     albuterol (PROVENTIL) (2.5 MG/3ML) 0.083% nebulizer solution INHALE 1 VIAL VIA NEBULIZER EVERY 6 HOURS AS NEEDED FOR WHEEZING OR SHORTNESS OF BREATH 75 mL 4   aspirin-acetaminophen-caffeine (EXCEDRIN MIGRAINE) 250-250-65 MG tablet Take 2 tablets by mouth every 6 (six) hours as needed for headache. (Patient not taking: Reported on 09/23/2023)     benzonatate (TESSALON) 100 MG capsule Take 1 capsule (100 mg total) by mouth every 8 (eight) hours. (Patient not taking: Reported on 09/23/2023) 21 capsule 0   clotrimazole-betamethasone (LOTRISONE) cream Apply topically 2 times daily. (Patient not taking: Reported on 09/23/2023) 30 g 0   doxycycline (VIBRAMYCIN) 100 MG capsule Take 1 capsule (100 mg total) by mouth 2 (two) times daily. (Patient not taking: Reported on 09/23/2023) 14 capsule 0   methylPREDNISolone (MEDROL DOSEPAK) 4 MG TBPK tablet Use as directed (Patient not taking: Reported on 09/23/2023) 21 tablet 0   methylPREDNISolone (MEDROL) 4 MG TBPK tablet Take as directed on package (Patient not taking: Reported on 09/23/2023) 21 tablet 0   nicotine (NICODERM CQ - DOSED IN MG/24 HOURS) 14 mg/24hr patch Place 1 patch (14 mg total) onto the skin daily. Apply 21 mg patch daily for 6 weeks, then appy the 14mg  patch daily for 2 weeks, then  apply the 7 mg patch daily for 2 weeks. (Patient not taking: Reported on 09/23/2023) 14 patch 0   nicotine (NICODERM CQ - DOSED IN MG/24 HOURS) 21 mg/24hr patch Place 1 patch (21 mg total) onto the skin daily. Apply the 21 mg patch daily for 6 weeks, then appy the 14mg  patch daily for 2 weeks, then apply the 7 mg patch daily for 2 weeks. (Patient not taking: Reported on 09/23/2023) 14 patch 2   nicotine (NICODERM CQ - DOSED IN MG/24 HR)  7 mg/24hr patch Place 1 patch (7 mg total) onto the skin daily. **Apply 21 mg patch daily for 6 weeks, then appy the 14mg  patch daily for 2 weeks, then apply the 7 mg patch daily for 2 weeks.** (Patient not taking: Reported on 09/23/2023) 14 patch 0   sulfamethoxazole-trimethoprim (BACTRIM DS) 800-160 MG tablet Take 1 tablet by mouth 2 (two) times daily for 10 days (Patient not taking: Reported on 09/23/2023) 20 tablet 0   No current facility-administered medications for this encounter.    Physical Findings:  The patient is in no acute distress. Patient is alert and oriented.  height is 5\' 2"  (1.575 m) and weight is 199 lb (90.3 kg). Her temperature is 97.8 F (36.6 C). Her blood pressure is 138/97 (abnormal) and her pulse is 88. Her respiration is 18 and oxygen saturation is 100%. Marland Kitchen    NECK: No palpable adenopathy in the cervical or supraclavicular neck HEENT: Mouth and upper throat are clear Heart: Regular in rate and rhythm Chest: clear to auscultation bilaterally Lymphatics: No palpable masses in the axillary regions bilaterally Skin: Satisfactory healing of skin in the radiation treatment fields MSK: Normal strength and muscle tone throughout.  Psych: affect is appropriate, alert an oriented. Insight intact.  Lab Findings: Lab Results  Component Value Date   WBC 6.3 12/16/2022   HGB 13.0 12/16/2022   HCT 39.8 12/16/2022   MCV 86.1 12/16/2022   PLT 267 12/16/2022    Radiographic Findings: I personally reviewed the radiological findings.    Impression/Plan: No evidence of disease on physical exam.  She has been followed with posttreatment CT imaging in medical oncology. CT scan ordered for April 2025.   TSH was obtained today. Awaiting results. Will follow-up with patient if the results came back abnormal.  Lab Results  Component Value Date   TSH 2.366 09/24/2022   I will see her back in a year with TSH testing and physical exam.  She will continue to follow closely with  medical oncology. Chest CT scan scheduled for April, 2025.   Patient would like help with weight loss. Nutrition referral made today.   Refill for albuterol nebulizer sent today. Patient endorses SOB with warm temperatures. I encouraged her to find a PCP to help manage this.   On date of service, in total, I spent 30 minutes on this encounter; she was seen in person  _____________________________________   Joyice Faster, PA

## 2023-09-25 ENCOUNTER — Other Ambulatory Visit: Payer: Self-pay

## 2023-11-06 ENCOUNTER — Ambulatory Visit: Payer: Medicaid Other | Admitting: Dietician

## 2023-12-15 ENCOUNTER — Ambulatory Visit (HOSPITAL_COMMUNITY)
Admission: RE | Admit: 2023-12-15 | Discharge: 2023-12-15 | Disposition: A | Discharge status: Home / Self Care | Source: Ambulatory Visit | Attending: Internal Medicine | Admitting: Internal Medicine

## 2023-12-15 ENCOUNTER — Inpatient Hospital Stay: Payer: Medicaid Other | Attending: Internal Medicine

## 2023-12-15 ENCOUNTER — Encounter (HOSPITAL_COMMUNITY): Payer: Self-pay

## 2023-12-15 DIAGNOSIS — C8118 Nodular sclerosis classical Hodgkin lymphoma, lymph nodes of multiple sites: Secondary | ICD-10-CM | POA: Insufficient documentation

## 2023-12-15 DIAGNOSIS — C8111 Nodular sclerosis classical Hodgkin lymphoma, lymph nodes of head, face, and neck: Secondary | ICD-10-CM | POA: Diagnosis present

## 2023-12-15 DIAGNOSIS — D649 Anemia, unspecified: Secondary | ICD-10-CM | POA: Insufficient documentation

## 2023-12-15 LAB — CMP (CANCER CENTER ONLY)
ALT: 21 U/L (ref 0–44)
AST: 20 U/L (ref 15–41)
Albumin: 4.4 g/dL (ref 3.5–5.0)
Alkaline Phosphatase: 53 U/L (ref 38–126)
Anion gap: 5 (ref 5–15)
BUN: 14 mg/dL (ref 6–20)
CO2: 28 mmol/L (ref 22–32)
Calcium: 9.5 mg/dL (ref 8.9–10.3)
Chloride: 107 mmol/L (ref 98–111)
Creatinine: 0.62 mg/dL (ref 0.44–1.00)
GFR, Estimated: >60 mL/min (ref >60)
Glucose, Bld: 104 mg/dL — ABNORMAL HIGH (ref 70–99)
Potassium: 4.3 mmol/L (ref 3.5–5.1)
Sodium: 140 mmol/L (ref 135–145)
Total Bilirubin: 0.4 mg/dL (ref 0.0–1.2)
Total Protein: 7.1 g/dL (ref 6.5–8.1)

## 2023-12-15 LAB — CBC WITH DIFFERENTIAL (CANCER CENTER ONLY)
Abs Immature Granulocytes: 0.01 10*3/uL (ref 0.00–0.07)
Basophils Absolute: 0 10*3/uL (ref 0.0–0.1)
Basophils Relative: 0 %
Eosinophils Absolute: 0.1 10*3/uL (ref 0.0–0.5)
Eosinophils Relative: 2 %
HCT: 34.3 % — ABNORMAL LOW (ref 36.0–46.0)
Hemoglobin: 11.4 g/dL — ABNORMAL LOW (ref 12.0–15.0)
Immature Granulocytes: 0 %
Lymphocytes Relative: 40 %
Lymphs Abs: 2.3 10*3/uL (ref 0.7–4.0)
MCH: 28.4 pg (ref 26.0–34.0)
MCHC: 33.2 g/dL (ref 30.0–36.0)
MCV: 85.3 fL (ref 80.0–100.0)
Monocytes Absolute: 0.6 10*3/uL (ref 0.1–1.0)
Monocytes Relative: 11 %
Neutro Abs: 2.7 10*3/uL (ref 1.7–7.7)
Neutrophils Relative %: 47 %
Platelet Count: 340 10*3/uL (ref 150–400)
RBC: 4.02 MIL/uL (ref 3.87–5.11)
RDW: 14.5 % (ref 11.5–15.5)
WBC Count: 5.8 10*3/uL (ref 4.0–10.5)
nRBC: 0 % (ref 0.0–0.2)

## 2023-12-15 LAB — LACTATE DEHYDROGENASE: LDH: 134 U/L (ref 98–192)

## 2023-12-15 MED ORDER — SODIUM CHLORIDE (PF) 0.9 % IJ SOLN
INTRAMUSCULAR | Status: AC
  Filled 2023-12-15: qty 50

## 2023-12-15 MED ORDER — IOHEXOL 300 MG/ML  SOLN
75.0000 mL | Freq: Once | INTRAMUSCULAR | Status: AC | PRN
  Administered 2023-12-15: 75 mL via INTRAVENOUS

## 2023-12-17 ENCOUNTER — Inpatient Hospital Stay (HOSPITAL_BASED_OUTPATIENT_CLINIC_OR_DEPARTMENT_OTHER): Payer: Medicaid Other | Admitting: Internal Medicine

## 2023-12-17 VITALS — BP 123/81 | HR 94 | Temp 97.5°F | Resp 17 | Ht 62.0 in | Wt 202.9 lb

## 2023-12-17 DIAGNOSIS — C8118 Nodular sclerosis classical Hodgkin lymphoma, lymph nodes of multiple sites: Secondary | ICD-10-CM | POA: Diagnosis present

## 2023-12-17 DIAGNOSIS — C8111 Nodular sclerosis classical Hodgkin lymphoma, lymph nodes of head, face, and neck: Secondary | ICD-10-CM | POA: Diagnosis not present

## 2023-12-17 DIAGNOSIS — D649 Anemia, unspecified: Secondary | ICD-10-CM | POA: Diagnosis not present

## 2023-12-17 NOTE — Progress Notes (Signed)
 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  PRIOR THERAPY:   1) Systemic chemotherapy with ABVD on days 1 and 15 every 4 weeks.  First dose June 23, 2019.  She is status post 2 cycles.  2) involved field radiotherapy under the care of Dr. Lurena Sally  CURRENT THERAPY: Observation.  INTERVAL HISTORY: Faith Pope 33 y.o. female returns to the clinic today for annual follow-up visit.Discussed the use of AI scribe software for clinical note transcription with the patient, who gave verbal consent to proceed.  History of Present Illness   Faith Pope is a 33 year old female with stage II A non bulky nodular sclerosing Hodgkin lymphoma who presents for evaluation and repeat imaging studies.  Diagnosed with stage two A non bulky nodular sclerosing Hodgkin lymphoma in October 2020, she has completed two cycles of systemic chemotherapy with ABVD and involved field radiotherapy. She is currently in the observation phase with no evidence of disease progression on recent imaging studies.  She noticed elevated glucose levels on her My Chart, which she attributes to consuming a Red Bull before the appointment. She also has mild anemia with a hemoglobin level of 11.5.  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 She recently had a baby who is 8 old.  During the review of symptoms, no new or concerning symptoms related to her lymphoma were reported. No swollen glands or other systemic symptoms.       MEDICAL HISTORY: Past Medical History:  Diagnosis Date   Asthma    Cancer (HCC)    Hodgkin Lymphoma diagnosed 04/2019   History of radiation therapy 09/08/19- 09/22/19   Right neck/ chest 11 fractions of 2 Gy each to total  22 Gy.     ALLERGIES:  is allergic to shellfish allergy, amoxicillin, and penicillins.  MEDICATIONS:  Current Outpatient Medications  Medication Sig Dispense Refill   acetaminophen  (TYLENOL ) 650 MG CR tablet Take 650 mg by mouth every 8 (eight) hours as needed for pain.     albuterol  (PROVENTIL ) (2.5 MG/3ML) 0.083% nebulizer solution INHALE 1 VIAL VIA NEBULIZER EVERY 6 HOURS AS NEEDED FOR WHEEZING OR SHORTNESS OF BREATH 90 mL 4   albuterol  (VENTOLIN  HFA) 108 (90 Base) MCG/ACT inhaler Inhale 1-2 puffs into the lungs every 6 (six) hours as needed for wheezing or shortness of breath.     albuterol  (VENTOLIN  HFA) 108 (90 Base) MCG/ACT inhaler Inhale 2-4 puffs into the lungs every 4 (four) hours as needed for wheezing or shortness of breath. 18 g 0   aspirin 500 MG buffered tablet Take 500 mg by mouth every 6 (six) hours as needed for pain.     aspirin-acetaminophen -caffeine (EXCEDRIN MIGRAINE) 250-250-65 MG tablet Take 2 tablets by mouth every 6 (six) hours as needed for headache. (Patient not taking: Reported on 09/23/2023)     benzonatate  (TESSALON ) 100 MG capsule Take 1 capsule (100 mg total) by mouth every 8 (eight) hours. (Patient not taking: Reported on 09/23/2023) 21 capsule 0   clotrimazole -betamethasone  (LOTRISONE ) cream Apply topically 2 times daily. (Patient not taking: Reported on 09/23/2023) 30 g 0   doxycycline  (VIBRAMYCIN ) 100 MG capsule Take 1 capsule (100 mg total) by mouth 2 (two) times daily. (Patient  not taking: Reported on 09/23/2023) 14 capsule 0   methylPREDNISolone  (MEDROL  DOSEPAK) 4 MG TBPK tablet Use as directed (Patient not taking: Reported on 09/23/2023) 21 tablet 0   methylPREDNISolone  (MEDROL ) 4 MG TBPK tablet Take as directed on package (Patient not taking: Reported on 09/23/2023) 21 tablet 0   nicotine  (NICODERM CQ  - DOSED IN MG/24 HOURS) 14 mg/24hr patch Place 1 patch (14 mg total) onto the skin daily. Apply 21 mg patch daily for 6 weeks, then appy the 14mg  patch daily for 2  weeks, then apply the 7 mg patch daily for 2 weeks. (Patient not taking: Reported on 09/23/2023) 14 patch 0   nicotine  (NICODERM CQ  - DOSED IN MG/24 HOURS) 21 mg/24hr patch Place 1 patch (21 mg total) onto the skin daily. Apply the 21 mg patch daily for 6 weeks, then appy the 14mg  patch daily for 2 weeks, then apply the 7 mg patch daily for 2 weeks. (Patient not taking: Reported on 09/23/2023) 14 patch 2   nicotine  (NICODERM CQ  - DOSED IN MG/24 HR) 7 mg/24hr patch Place 1 patch (7 mg total) onto the skin daily. **Apply 21 mg patch daily for 6 weeks, then appy the 14mg  patch daily for 2 weeks, then apply the 7 mg patch daily for 2 weeks.** (Patient not taking: Reported on 09/23/2023) 14 patch 0   sulfamethoxazole -trimethoprim  (BACTRIM  DS) 800-160 MG tablet Take 1 tablet by mouth 2 (two) times daily for 10 days (Patient not taking: Reported on 09/23/2023) 20 tablet 0   No current facility-administered medications for this visit.    SURGICAL HISTORY:  Past Surgical History:  Procedure Laterality Date   IR BONE MARROW BIOPSY & ASPIRATION  06/21/2019   IR IMAGING GUIDED PORT INSERTION  06/21/2019   IR PATIENT EVAL TECH 0-60 MINS  01/15/2023   IR PATIENT EVAL TECH 0-60 MINS  02/05/2023   IR REMOVAL TUN ACCESS W/ PORT W/O FL MOD SED  01/07/2023   MASS BIOPSY Right 05/11/2019   Procedure: EXCISIONAL BIOPSY OF RIGHT CERVICAL LYMPH NODE;  Surgeon: Virgina Grills, MD;  Location: [facility] OR;  Service: ENT;  Laterality: Right;   TONSILLECTOMY      REVIEW OF SYSTEMS:  A comprehensive review of systems was negative.   PHYSICAL EXAMINATION: General appearance: alert, cooperative, and no distress Head: Normocephalic, without obvious abnormality, atraumatic Neck: no adenopathy, no JVD, supple, symmetrical, trachea midline, and thyroid  not enlarged, symmetric, no tenderness/mass/nodules Lymph nodes: Cervical, supraclavicular, and axillary nodes normal. Resp: clear to auscultation bilaterally Back: negative, no kyphosis  present, symmetric, no curvature. ROM normal. No CVA tenderness. Cardio: regular rate and rhythm, S1, S2 normal, no murmur, click, rub or gallop GI: soft, non-tender; bowel sounds normal; no masses,  no organomegaly Extremities: extremities normal, atraumatic, no cyanosis or edema  ECOG PERFORMANCE STATUS: 0 - Asymptomatic  Blood pressure 123/81, pulse 94, temperature (!) 97.5 F (36.4 C), temperature source Temporal, resp. rate 17, height 5\' 2"  (1.575 m), weight 202 lb 14.4 oz (92 kg).  LABORATORY DATA: Lab Results  Component Value Date   WBC 5.8 12/15/2023   HGB 11.4 (L) 12/15/2023   HCT 34.3 (L) 12/15/2023   MCV 85.3 12/15/2023   PLT 340 12/15/2023      Chemistry      Component Value Date/Time   NA 140 12/15/2023 1128   K 4.3 12/15/2023 1128   CL 107 12/15/2023 1128   CO2 28 12/15/2023 1128   BUN 14 12/15/2023 1128   CREATININE 0.62 12/15/2023 1128  Component Value Date/Time   CALCIUM 9.5 12/15/2023 1128   ALKPHOS 53 12/15/2023 1128   AST 20 12/15/2023 1128   ALT 21 12/15/2023 1128   BILITOT 0.4 12/15/2023 1128       RADIOGRAPHIC STUDIES: CT Chest W Contrast Result Date: 12/16/2023 EXAM: CT CHEST WITH CONTRAST 12/15/2023 03:24:05 PM TECHNIQUE: CT of the chest was performed with the administration of intravenous contrast. Multiplanar reformatted images are provided for review. Automated exposure control, iterative reconstruction, and/or weight based adjustment of the mA/kV was utilized to reduce the radiation dose to as low as reasonably achievable. CONTRAST: 75 mL (iohexol  (OMNIPAQUE ) 300 MG/ML solution 75 mL IOHEXOL  300 MG/ML SOLN) COMPARISON: None available. CLINICAL HISTORY: Hematologic malignancy, monitor. Hx of Hodgkin's lymphoma dx'd 2020, chemo and xrt complete, pt. denies any chest complaints. FINDINGS: MEDIASTINUM: Heart and pericardium are unremarkable. The central airways are clear. LYMPH NODES: Small mediastinal nodes, including 11 mm short axis right  paratracheal node (image 39), unchanged LUNGS AND PLEURA: Stable perifissural nodules measuring up to 4 mm along the right minor fissure (image 67). Stable faint patchy ground-glass opacities in the lungs bilaterally, right lung predominant. Favoring respiratory bronchiolitis. Dilated terminal bronchiole in the medial right lung base with associated chronic mucoid impaction. No focal consolidation or pulmonary edema. No evidence of pleural effusion or pneumothorax. SOFT TISSES/ BONES: No acute abnormality of the bones or soft tissues. UPPER ABDOMEN: Limited images of the upper abdomen demonstrates no acute abnormality. IMPRESSION: 1. Stable small mediastinal nodes, including 11 mm short axis right paratracheal node. 2. Stable faint patchy ground-glass opacities in the lungs bilaterally, right lung predominant, favoring respiratory bronchiolitis. Electronically signed by: Zadie Herter MD 12/16/2023 03:04 PM EDT RP Workstation: [id number]     ASSESSMENT AND PLAN: This is a very pleasant 33 years old African-American female recently diagnosed with a stage IIA Hodgkin lymphoma, nodular sclerosing subtype diagnosed in October 2020 and presented with right cervical and supraclavicular lymphadenopathy as well as right mediastinal lymphadenopathy.  The patient has no evidence of disease below the diaphragm. She does not have a bulky disease and she has no significant unfavorable risk factors. The patient underwent systemic chemotherapy with ABVD status post 2 cycles. This was followed by involved field radiation under the care of Dr. Lurena Sally. She had repeat CT scan of the chest performed recently.  I personally and independently reviewed the scan and discussed the result with the patient today.  There is no evidence for disease recurrence.    Stage IIA non-bulky nodular sclerosing Hodgkin lymphoma Diagnosed in October 2020. Status post two cycles of systemic chemotherapy with ABVD and involved field  radiotherapy. Currently in observation phase with no evidence of disease recurrence on recent imaging studies. Clinically well-managed, almost five years post-diagnosis. - Continue observation with follow-up in one year. - Repeat lab work and imaging studies at next visit.  Mild anemia Hemoglobin level of 11.5 g/dL, likely secondary to postpartum status and nutritional factors. No acute concerns. - Recommend taking iron tablets every other day.   The patient was advised to call immediately if she has any other concerning symptoms in the interval. The patient voices understanding of current disease status and treatment options and is in agreement with the current care plan.  All questions were answered. The patient knows to call the clinic with any problems, questions or concerns. We can certainly see the patient much sooner if necessary.   Disclaimer: This note was dictated with voice recognition software. Similar sounding words can inadvertently  be transcribed and may not be corrected upon review.

## 2023-12-18 ENCOUNTER — Telehealth: Payer: Self-pay | Admitting: Internal Medicine

## 2023-12-18 NOTE — Telephone Encounter (Signed)
 Left the patient a voicemail with appointment details and the patient is active on MyChart.

## 2023-12-23 ENCOUNTER — Encounter: Payer: Self-pay | Admitting: Dietician

## 2023-12-23 ENCOUNTER — Encounter: Attending: Radiology | Admitting: Dietician

## 2023-12-23 DIAGNOSIS — C8111 Nodular sclerosis classical Hodgkin lymphoma, lymph nodes of head, face, and neck: Secondary | ICD-10-CM | POA: Insufficient documentation

## 2023-12-23 NOTE — Progress Notes (Signed)
 Medical Nutrition Therapy  Appointment Start time:  0930  Appointment End time:  1022  Primary concerns today: no specific concerns   Referral diagnosis: hodgkin's disease Preferred learning style: no preference indicated Learning readiness: ready   NUTRITION ASSESSMENT   Anthropometrics   Weight declined Wt Readings from Last 3 Encounters:  12/17/23 202 lb 14.4 oz (92 kg)  09/23/23 199 lb (90.3 kg)  12/18/22 202 lb 6.4 oz (91.8 kg)   Clinical Medical Hx: asthma, cancer, anemia Medications: reviewed Labs: reviewed Notable Signs/Symptoms: none reported Food Allergies: shellfish  Lifestyle & Dietary Hx  Pt reports she had chemo/radiation in 2020-2021 and beat cancer.   Pt reports she lives with her partner, their 89 month old, 1 year old god son, and 2 dogs. Pt reports she works at [employer], 4 days per week. Pt states work is right next to [employer] and they get to have free breakfast so she goes there on work days. Pt reports it is also next to the shell, where she will buy her black & mild cigars, making it more tempting to smoke while at work. Pt states she wants to reduce smoking. Pt states once her partner got pregnant she stopped smoking indoors, in cars, and around the baby, so when she is outside (works outside) she wants to smoke. Pt reports currently smoking 12 blacks a day and wants to reduce to 7.   Pt states at work she may or may not get a lunch break. Pt reports the last 3 months her partner has been trying to cook more at home, and if she cooks then pt will pack lunch. Pt reports otherwise she will [employer] chinese food or pizza hut. Pt states she does not like any fruits, but likes a variety of vegetables as long as they're cooked. Pt reports for a while they were eating out daily, but now trying to cook from home more often. Pt reports she avoids beef and pork.   Pt states she used to go to [employer] and enjoyed the treadmill but no longer has the membership.  Pt states she enjoys going walking at different parks with her family.   Pt reports she thinks she has endometriosis, which effects her sleep for 2 weeks of the month.   Estimated daily fluid intake: 64+ oz Supplements: none reported Sleep: 5-7 hours sleep, naps on days off Stress / self-care: low-to-moderate Current average weekly physical activity: ADLs  24-Hr Dietary Recall First Meal: 0745am: [employer] fried chicken, 2 scrambled eggs, oatmeal Snack: none Second Meal: granola bar OR chinese food [employer] OR [employer] mini pizza Snack: none Third Meal: deli meat OR salmon and rice and vegetable Snack: deli meat Beverages: 6 red bull, 6 water  bottle, juice   NUTRITION DIAGNOSIS  NB-1.1 Food and nutrition-related knowledge deficit As related to lack of prior education by a registered dietitian.  As evidenced by pt report.   NUTRITION INTERVENTION  Nutrition education (E-1) on the following topics:   Plate Method Fruits & Vegetables: Aim to fill half your plate with a variety of fruits and vegetables. They are rich in vitamins, minerals, and fiber, and can help reduce the risk of chronic diseases. Choose a colorful assortment of fruits and vegetables to ensure you get a wide range of nutrients. Grains and Starches: Make at least half of your grain choices whole grains, such as brown rice, whole wheat bread, and oats. Whole grains provide fiber, which aids in digestion and healthy cholesterol levels. Aim  for whole forms of starchy vegetables such as potatoes, sweet potatoes, beans, peas, and corn, which are fiber rich and provide many vitamins and minerals.  Protein: Incorporate lean sources of protein, such as poultry, fish, beans, nuts, and seeds, into your meals. Protein is essential for building and repairing tissues, staying full, balancing blood sugar, as well as supporting immune function. Dairy: Include low-fat or fat-free dairy products like milk, yogurt, and cheese in your  diet. Dairy foods are excellent sources of calcium and vitamin D, which are crucial for bone health.   Exercise Finding an exercise you enjoy is crucial for maintaining long-term fitness and overall health. Enjoyable activities are more likely to become regular habits, making it easier to stay consistent with physical activity. When you look forward to your workouts, exercise becomes a positive experience rather than a chore, reducing the likelihood of burnout or quitting. Enjoyable exercise also enhances mental well-being, as engaging in activities you love can boost mood, reduce stress, and provide a sense of accomplishment.  Reduce/Quit Smoking Tips Reducing or quitting smoking is a major step toward better health, and there are several effective strategies you can try. Here are some practical tips to help you cut back or quit:  Understand Your Triggers - Identify when and why you smoke (e.g., stress, boredom, after meals). - Develop alternative coping strategies, such as deep breathing, taking a walk, or chewing gum.  Cut Back Gradually - Delay your first cigarette of the day by 30 minutes, then an hour, and so on. - Reduce the number of cigarettes you smoke daily.  Stay Busy - Distract yourself with hobbies, exercise, or social activities. - Keep your hands and mouth busy--try stress balls, toothpicks, cinnamon stick, or snacks like carrots.   Handouts Provided Include  Plate Method  Learning Style & Readiness for Change Teaching method utilized: Visual & Auditory  Demonstrated degree of understanding via: Teach Back  Barriers to learning/adherence to lifestyle change: none  Goals Established by Pt  Goal 1: reduce red bull from 6 a day to 2 a day.   Goal 2:  reduce smoking to 7 a day.  Goal 3: pack lunch for work (aim to include a protein, starch, and vegetables)   MONITORING & EVALUATION Dietary intake, weekly physical activity, and follow up in 6-8 weeks.  Next Steps   Patient is to call for questions.

## 2023-12-23 NOTE — Patient Instructions (Signed)
 Goals Established by Pt  Goal 1: reduce red bull from 6 a day to 2 a day.   Goal 2:  reduce smoking to 7 a day.  Goal 3: pack lunch for work (aim to include a protein, starch, and vegetables)

## 2024-02-12 ENCOUNTER — Encounter: Payer: Self-pay | Admitting: Dietician

## 2024-02-12 ENCOUNTER — Encounter: Attending: Radiology | Admitting: Dietician

## 2024-02-12 VITALS — Wt 200.0 lb

## 2024-02-12 DIAGNOSIS — C8190 Hodgkin lymphoma, unspecified, unspecified site: Secondary | ICD-10-CM | POA: Diagnosis present

## 2024-02-12 NOTE — Progress Notes (Signed)
 Medical Nutrition Therapy  Appointment Start time:  71  Appointment End time:  1215  Referral diagnosis: hodgkin's disease Preferred learning style: no preference indicated Learning readiness: ready   NUTRITION ASSESSMENT   Anthropometrics   Weight declined Wt Readings from Last 3 Encounters:  02/12/24 200 lb (90.7 kg)  12/17/23 202 lb 14.4 oz (92 kg)  09/23/23 199 lb (90.3 kg)   Clinical Medical Hx: asthma, cancer, anemia Medications: reviewed Labs: reviewed Notable Signs/Symptoms: none reported Food Allergies: shellfish  Lifestyle & Dietary Hx  Pt reports she has been working on making lifestyle and nutrition changes, including packing lunch occasionally for work, stopped getting fried chicken from [employer] with breakfast and switched to eggs and oatmeal, drinking more water , cut back on redbulls (reduced from 6-8 cans daily to 1 can), and reduced smoking black and milds from 12 a day to 5 per day. Pt reports she notices smoking more when she is at work.   Pt reports her and her partner have been trying to cook more at home and recently made a dinner of salmon patties, quinoa, broccoli, and rolls.   Pt states her partner has been going walking with their kids most days. Pt reports she does not like the outdoors but will try to start walking twice a week. Pt states they go to the science center every [place].    Estimated daily fluid intake: 64+ oz Supplements: none reported Sleep: 5-7 hours sleep, naps on days off Stress / self-care: low-to-moderate Current average weekly physical activity: ADLs  24-Hr Dietary Recall First Meal: 0730am: [employer] scrambled eggs and oatmeal Snack: none Second Meal: [employer] mini pizza OR bbq chicken meatballs and greens and mac and cheese Snack: none Third Meal: salmon patties OR baked chicken and broccoli and quinoa and roll Snack: none Beverages: 1 red bull. 5 bottles water    NUTRITION DIAGNOSIS  NB-1.1 Food and  nutrition-related knowledge deficit As related to lack of prior education by a registered dietitian.  As evidenced by pt report.   NUTRITION INTERVENTION  Nutrition education (E-1) on the following topics:   Hydration Adequate hydration is crucial for optimal nutrition and overall health. It aids in the digestion and absorption of nutrients, ensuring the body can effectively process and transport them to cells. Water  also supports detoxification by helping the kidneys flush out waste, while maintaining hydration promotes energy levels and metabolism. Proper hydration helps control appetite and ensures a healthy balance of electrolytes, which are vital for muscle function and cellular processes. Overall, staying well-hydrated is essential for the body to utilize nutrients efficiently and maintain balance.  Plate Method Fruits & Vegetables: Aim to fill half your plate with a variety of fruits and vegetables. They are rich in vitamins, minerals, and fiber, and can help reduce the risk of chronic diseases. Choose a colorful assortment of fruits and vegetables to ensure you get a wide range of nutrients. Grains and Starches: Make at least half of your grain choices whole grains, such as brown rice, whole wheat bread, and oats. Whole grains provide fiber, which aids in digestion and healthy cholesterol levels. Aim for whole forms of starchy vegetables such as potatoes, sweet potatoes, beans, peas, and corn, which are fiber rich and provide many vitamins and minerals.  Protein: Incorporate lean sources of protein, such as poultry, fish, beans, nuts, and seeds, into your meals. Protein is essential for building and repairing tissues, staying full, balancing blood sugar, as well as supporting immune function. Dairy: Include low-fat or  fat-free dairy products like milk, yogurt, and cheese in your diet. Dairy foods are excellent sources of calcium and vitamin D, which are crucial for bone health.    Exercise Finding an exercise you enjoy is crucial for maintaining long-term fitness and overall health. Enjoyable activities are more likely to become regular habits, making it easier to stay consistent with physical activity. When you look forward to your workouts, exercise becomes a positive experience rather than a chore, reducing the likelihood of burnout or quitting. Enjoyable exercise also enhances mental well-being, as engaging in activities you love can boost mood, reduce stress, and provide a sense of accomplishment.  Reduce/Quit Smoking Tips Reducing or quitting smoking is a major step toward better health, and there are several effective strategies you can try. Here are some practical tips to help you cut back or quit:  Understand Your Triggers - Identify when and why you smoke (e.g., stress, boredom, after meals). - Develop alternative coping strategies, such as deep breathing, taking a walk, or chewing gum.  Cut Back Gradually - Delay your first cigarette of the day by 30 minutes, then an hour, and so on. - Reduce the number of cigarettes you smoke daily.  Stay Busy - Distract yourself with hobbies, exercise, or social activities. - Keep your hands and mouth busy--try stress balls, toothpicks, cinnamon stick, or snacks like carrots.  Handouts Provided Include (previous assessment) Plate Method  Learning Style & Readiness for Change Teaching method utilized: Visual & Auditory  Demonstrated degree of understanding via: Teach Back  Barriers to learning/adherence to lifestyle change: none  Assessment of Previous Goals Established by Pt  Goal 1: reduce red bull from 6 a day to 2 a day. - goal met, currently drinking 1 per day. Great job!!  Goal 2:  reduce smoking to 7 a day. - goal met, currently smoking 5/day. Continue to work toward reducing!!  Goal 3: pack lunch for work (aim to include a protein, starch, and vegetables).- goal in progress, packs some days.   New  Goals:   Goal 1: Go walking 2 times a week.   Goal 2: Pick up the nicotine  patches from the pharmacy.    MONITORING & EVALUATION Dietary intake, weekly physical activity, and follow up in 2 months.  Next Steps  Patient is to call for questions.

## 2024-04-13 ENCOUNTER — Ambulatory Visit: Admitting: Dietician

## 2024-05-07 ENCOUNTER — Ambulatory Visit
Admission: RE | Admit: 2024-05-07 | Discharge: 2024-05-07 | Disposition: A | Discharge status: Home / Self Care | Source: Ambulatory Visit | Attending: Family Medicine | Admitting: Family Medicine

## 2024-05-07 VITALS — BP 150/93 | HR 97 | Temp 99.3°F | Resp 18

## 2024-05-07 DIAGNOSIS — J4531 Mild persistent asthma with (acute) exacerbation: Secondary | ICD-10-CM | POA: Diagnosis not present

## 2024-05-07 DIAGNOSIS — J45901 Unspecified asthma with (acute) exacerbation: Secondary | ICD-10-CM

## 2024-05-07 DIAGNOSIS — J988 Other specified respiratory disorders: Secondary | ICD-10-CM

## 2024-05-07 DIAGNOSIS — B9789 Other viral agents as the cause of diseases classified elsewhere: Secondary | ICD-10-CM

## 2024-05-07 MED ORDER — ALBUTEROL SULFATE (2.5 MG/3ML) 0.083% IN NEBU: 2.5000 mg | INHALATION_SOLUTION | RESPIRATORY_TRACT | 0 refills | Status: AC | PRN

## 2024-05-07 MED ORDER — ALBUTEROL SULFATE HFA 108 (90 BASE) MCG/ACT IN AERS: 1.0000 | INHALATION_SPRAY | Freq: Four times a day (QID) | RESPIRATORY_TRACT | 0 refills | Status: AC | PRN

## 2024-05-07 MED ORDER — PREDNISONE 20 MG PO TABS: ORAL_TABLET | ORAL | 0 refills | Status: AC

## 2024-05-07 MED ORDER — PROMETHAZINE-DM 6.25-15 MG/5ML PO SYRP: 5.0000 mL | ORAL_SOLUTION | Freq: Three times a day (TID) | ORAL | 0 refills | Status: AC | PRN

## 2024-05-07 NOTE — ED Triage Notes (Signed)
 Pt reports chest pain, chest congestion, nasal congestion, left lung hurts when walking, shortness of breath when walking x 1 day. Pt taking hot tea and albuterol  gives no relief. States she felt like this before and steroids gives relief.   Pt do not want COVID/Flu test.

## 2024-05-07 NOTE — ED Provider Notes (Signed)
 [facility]  Note:  This document was prepared using Conservation officer, historic buildings and may include unintentional dictation errors.  MRN: [id number] DOB: 1991-03-13  Subjective:   Faith Pope is a 33 y.o. female with pmh of asthma presenting for 1 day history shortness of breath, malaise, fatigue, sinus congestion, chest congestion, chest pain with walking and coughing. Needs a refill of her albuterol  inhaler. Has been using her nebulized albuterol . Occasionally smokes cigars. Made her current symptoms worse.   No current facility-administered medications for this encounter.  Current Outpatient Medications:    acetaminophen  (TYLENOL ) 650 MG CR tablet, Take 650 mg by mouth every 8 (eight) hours as needed for pain., Disp: , Rfl:    albuterol  (PROVENTIL ) (2.5 MG/3ML) 0.083% nebulizer solution, INHALE 1 VIAL VIA NEBULIZER EVERY 6 HOURS AS NEEDED FOR WHEEZING OR SHORTNESS OF BREATH, Disp: 90 mL, Rfl: 4   albuterol  (VENTOLIN  HFA) 108 (90 Base) MCG/ACT inhaler, Inhale 1-2 puffs into the lungs every 6 (six) hours as needed for wheezing or shortness of breath., Disp: , Rfl:    albuterol  (VENTOLIN  HFA) 108 (90 Base) MCG/ACT inhaler, Inhale 2-4 puffs into the lungs every 4 (four) hours as needed for wheezing or shortness of breath., Disp: 18 g, Rfl: 0   aspirin 500 MG buffered tablet, Take 500 mg by mouth every 6 (six) hours as needed for pain., Disp: , Rfl:    aspirin-acetaminophen -caffeine (EXCEDRIN MIGRAINE) 250-250-65 MG tablet, Take 2 tablets by mouth every 6 (six) hours as needed for headache. (Patient not taking: Reported on 09/23/2023), Disp: , Rfl:    benzonatate  (TESSALON ) 100 MG capsule, Take 1 capsule (100 mg total) by mouth every 8 (eight) hours. (Patient not taking: Reported on 09/24/2022), Disp: 21 capsule, Rfl: 0   clotrimazole -betamethasone  (LOTRISONE ) cream, Apply topically 2 times daily. (Patient not taking: Reported on 09/24/2022), Disp: 30 g, Rfl: 0    doxycycline  (VIBRAMYCIN ) 100 MG capsule, Take 1 capsule (100 mg total) by mouth 2 (two) times daily. (Patient not taking: Reported on 09/24/2022), Disp: 14 capsule, Rfl: 0   methylPREDNISolone  (MEDROL  DOSEPAK) 4 MG TBPK tablet, Use as directed (Patient not taking: Reported on 09/24/2022), Disp: 21 tablet, Rfl: 0   methylPREDNISolone  (MEDROL ) 4 MG TBPK tablet, Take as directed on package (Patient not taking: Reported on 09/23/2023), Disp: 21 tablet, Rfl: 0   nicotine  (NICODERM CQ  - DOSED IN MG/24 HOURS) 14 mg/24hr patch, Place 1 patch (14 mg total) onto the skin daily. Apply 21 mg patch daily for 6 weeks, then appy the 14mg  patch daily for 2 weeks, then apply the 7 mg patch daily for 2 weeks. (Patient not taking: Reported on 09/23/2023), Disp: 14 patch, Rfl: 0   nicotine  (NICODERM CQ  - DOSED IN MG/24 HOURS) 21 mg/24hr patch, Place 1 patch (21 mg total) onto the skin daily. Apply the 21 mg patch daily for 6 weeks, then appy the 14mg  patch daily for 2 weeks, then apply the 7 mg patch daily for 2 weeks. (Patient not taking: Reported on 09/23/2023), Disp: 14 patch, Rfl: 2   nicotine  (NICODERM CQ  - DOSED IN MG/24 HR) 7 mg/24hr patch, Place 1 patch (7 mg total) onto the skin daily. **Apply 21 mg patch daily for 6 weeks, then appy the 14mg  patch daily for 2 weeks, then apply the 7 mg patch daily for 2 weeks.** (Patient not taking: Reported on 09/23/2023), Disp: 14 patch, Rfl: 0   sulfamethoxazole -trimethoprim  (BACTRIM  DS) 800-160 MG tablet, Take 1 tablet by mouth  2 (two) times daily for 10 days (Patient not taking: Reported on 09/24/2022), Disp: 20 tablet, Rfl: 0   Allergies  Allergen Reactions   Shellfish Allergy Other (See Comments)    Mouth burns and tongue numb   Amoxicillin Rash   Penicillins Rash    Has patient had a PCN reaction causing immediate rash, facial/tongue/throat swelling, SOB or lightheadedness with hypotension: rash Has patient had a PCN reaction causing severe rash involving mucus membranes or  skin necrosis: No Has patient had a PCN reaction that required hospitalization: No Has patient had a PCN reaction occurring within the last 10 years: No If all of the above answers are NO, then may proceed with Cephalosporin use.    Past Medical History:  Diagnosis Date   Asthma    Cancer (HCC)    Hodgkin Lymphoma diagnosed 04/2019   History of radiation therapy 09/08/19- 09/22/19   Right neck/ chest 11 fractions of 2 Gy each to total 22 Gy.      Past Surgical History:  Procedure Laterality Date   IR BONE MARROW BIOPSY & ASPIRATION  06/21/2019   IR IMAGING GUIDED PORT INSERTION  06/21/2019   IR PATIENT EVAL TECH 0-60 MINS  01/15/2023   IR PATIENT EVAL TECH 0-60 MINS  02/05/2023   IR REMOVAL TUN ACCESS W/ PORT W/O FL MOD SED  01/07/2023   MASS BIOPSY Right 05/11/2019   Procedure: EXCISIONAL BIOPSY OF RIGHT CERVICAL LYMPH NODE;  Surgeon: Carlie Clark, MD;  Location: [facility] OR;  Service: ENT;  Laterality: Right;   TONSILLECTOMY      Family History  Problem Relation Age of Onset   Hypertension Mother    Diabetes Mother     Social History   Tobacco Use   Smoking status: Some Days    Types: Cigarettes   Smokeless tobacco: Never   Tobacco comments:    Per pt: 2-3 cigars daily (12/21/20)  Vaping Use   Vaping status: Never Used  Substance Use Topics   Alcohol use: Not Currently   Drug use: Never    ROS   Objective:   Vitals: BP (!) 150/93 (BP Location: Right Arm)   Pulse 97   Temp 99.3 F (37.4 C) (Oral)   Resp 18   SpO2 98%   Physical Exam Constitutional:      General: She is not in acute distress.    Appearance: Normal appearance. She is well-developed. She is not ill-appearing, toxic-appearing or diaphoretic.  HENT:     Head: Normocephalic and atraumatic.     Nose: Nose normal.     Mouth/Throat:     Mouth: Mucous membranes are moist.     Pharynx: No pharyngeal swelling, oropharyngeal exudate, posterior oropharyngeal erythema or uvula swelling.     Tonsils: No  tonsillar exudate or tonsillar abscesses. 0 on the right. 0 on the left.  Eyes:     General: No scleral icterus.       Right eye: No discharge.        Left eye: No discharge.     Extraocular Movements: Extraocular movements intact.  Cardiovascular:     Rate and Rhythm: Normal rate and regular rhythm.     Heart sounds: Normal heart sounds. No murmur heard.    No friction rub. No gallop.  Pulmonary:     Effort: Pulmonary effort is normal. No respiratory distress.     Breath sounds: No stridor. No decreased breath sounds, wheezing, rhonchi or rales.  Chest:     Chest  wall: No tenderness.  Skin:    General: Skin is warm and dry.  Neurological:     General: No focal deficit present.     Mental Status: She is alert and oriented to person, place, and time.  Psychiatric:        Mood and Affect: Mood normal.        Behavior: Behavior normal.     Assessment and Plan :   PDMP not reviewed this encounter.  1. Mild persistent asthma with (acute) exacerbation   2. Moderate asthma with acute exacerbation, unspecified whether persistent   3. Viral respiratory infection    Refilled her nebulized albuterol  and albuterol  inhaler.  I was agreeable to using a round of prednisone  given her respiratory symptoms, multiple exposures to respiratory irritants and general history of doing well with steroids.  Will defer imaging for now.  Patient declined respiratory testing.  Counseled patient on potential for adverse effects with medications prescribed/recommended today, ER and return-to-clinic precautions discussed, patient verbalized understanding.    Christopher Savannah, PA-C 05/07/24 1428

## 2024-05-08 ENCOUNTER — Ambulatory Visit
Admission: EM | Admit: 2024-05-08 | Discharge: 2024-05-08 | Disposition: A | Discharge status: Home / Self Care | Attending: Family Medicine | Admitting: Family Medicine

## 2024-05-08 ENCOUNTER — Ambulatory Visit: Payer: Self-pay | Admitting: Urgent Care

## 2024-05-08 ENCOUNTER — Ambulatory Visit (INDEPENDENT_AMBULATORY_CARE_PROVIDER_SITE_OTHER)

## 2024-05-08 DIAGNOSIS — R0989 Other specified symptoms and signs involving the circulatory and respiratory systems: Secondary | ICD-10-CM | POA: Diagnosis not present

## 2024-05-08 DIAGNOSIS — J4531 Mild persistent asthma with (acute) exacerbation: Secondary | ICD-10-CM

## 2024-05-08 DIAGNOSIS — R0789 Other chest pain: Secondary | ICD-10-CM | POA: Diagnosis not present

## 2024-05-08 NOTE — Discharge Instructions (Signed)
 I will call and finalize treatment plan after we get the x-ray report.

## 2024-05-08 NOTE — ED Provider Notes (Signed)
 [facility]  Note:  This document was prepared using Conservation officer, historic buildings and may include unintentional dictation errors.  MRN: [id number] DOB: 1991-03-13  Subjective:   Faith Pope is a 33 y.o. female presenting for recheck on lower respiratory symptoms.  Patient reports that she is worse today including left-sided chest pain and chest congestion.  Yesterday was seen and lying to respiratory testing.  She had a clear cardiopulmonary exam and therefore chest x-ray was deferred.  Will start her on prednisone  due to her respiratory symptoms and underlying asthma.  Refills provided to her for her albuterol  medications.  No current facility-administered medications for this encounter.  Current Outpatient Medications:    acetaminophen  (TYLENOL ) 650 MG CR tablet, Take 650 mg by mouth every 8 (eight) hours as needed for pain., Disp: , Rfl:    albuterol  (PROVENTIL ) (2.5 MG/3ML) 0.083% nebulizer solution, Take 3 mLs (2.5 mg total) by nebulization every 4 (four) hours as needed for wheezing or shortness of breath., Disp: 75 mL, Rfl: 0   albuterol  (VENTOLIN  HFA) 108 (90 Base) MCG/ACT inhaler, Inhale 1-2 puffs into the lungs every 6 (six) hours as needed for wheezing or shortness of breath., Disp: 8 g, Rfl: 0   aspirin 500 MG buffered tablet, Take 500 mg by mouth every 6 (six) hours as needed for pain., Disp: , Rfl:    aspirin-acetaminophen -caffeine (EXCEDRIN MIGRAINE) 250-250-65 MG tablet, Take 2 tablets by mouth every 6 (six) hours as needed for headache. (Patient not taking: Reported on 09/23/2023), Disp: , Rfl:    benzonatate  (TESSALON ) 100 MG capsule, Take 1 capsule (100 mg total) by mouth every 8 (eight) hours. (Patient not taking: Reported on 09/24/2022), Disp: 21 capsule, Rfl: 0   clotrimazole -betamethasone  (LOTRISONE ) cream, Apply topically 2 times daily. (Patient not taking: Reported on 09/24/2022), Disp: 30 g, Rfl: 0   doxycycline  (VIBRAMYCIN ) 100 MG capsule,  Take 1 capsule (100 mg total) by mouth 2 (two) times daily. (Patient not taking: Reported on 09/24/2022), Disp: 14 capsule, Rfl: 0   methylPREDNISolone  (MEDROL  DOSEPAK) 4 MG TBPK tablet, Use as directed (Patient not taking: Reported on 09/24/2022), Disp: 21 tablet, Rfl: 0   methylPREDNISolone  (MEDROL ) 4 MG TBPK tablet, Take as directed on package (Patient not taking: Reported on 09/23/2023), Disp: 21 tablet, Rfl: 0   nicotine  (NICODERM CQ  - DOSED IN MG/24 HOURS) 14 mg/24hr patch, Place 1 patch (14 mg total) onto the skin daily. Apply 21 mg patch daily for 6 weeks, then appy the 14mg  patch daily for 2 weeks, then apply the 7 mg patch daily for 2 weeks. (Patient not taking: Reported on 09/23/2023), Disp: 14 patch, Rfl: 0   nicotine  (NICODERM CQ  - DOSED IN MG/24 HOURS) 21 mg/24hr patch, Place 1 patch (21 mg total) onto the skin daily. Apply the 21 mg patch daily for 6 weeks, then appy the 14mg  patch daily for 2 weeks, then apply the 7 mg patch daily for 2 weeks. (Patient not taking: Reported on 09/23/2023), Disp: 14 patch, Rfl: 2   nicotine  (NICODERM CQ  - DOSED IN MG/24 HR) 7 mg/24hr patch, Place 1 patch (7 mg total) onto the skin daily. **Apply 21 mg patch daily for 6 weeks, then appy the 14mg  patch daily for 2 weeks, then apply the 7 mg patch daily for 2 weeks.** (Patient not taking: Reported on 09/23/2023), Disp: 14 patch, Rfl: 0   predniSONE  (DELTASONE ) 20 MG tablet, Take 2 tablets daily with breakfast., Disp: 10 tablet, Rfl: 0  promethazine -dextromethorphan (PROMETHAZINE -DM) 6.25-15 MG/5ML syrup, Take 5 mLs by mouth 3 (three) times daily as needed for cough., Disp: 200 mL, Rfl: 0   sulfamethoxazole -trimethoprim  (BACTRIM  DS) 800-160 MG tablet, Take 1 tablet by mouth 2 (two) times daily for 10 days (Patient not taking: Reported on 09/24/2022), Disp: 20 tablet, Rfl: 0   Allergies  Allergen Reactions   Shellfish Allergy Other (See Comments)    Mouth burns and tongue numb   Amoxicillin Rash   Penicillins Rash     Has patient had a PCN reaction causing immediate rash, facial/tongue/throat swelling, SOB or lightheadedness with hypotension: rash Has patient had a PCN reaction causing severe rash involving mucus membranes or skin necrosis: No Has patient had a PCN reaction that required hospitalization: No Has patient had a PCN reaction occurring within the last 10 years: No If all of the above answers are NO, then may proceed with Cephalosporin use.    Past Medical History:  Diagnosis Date   Asthma    Cancer (HCC)    Hodgkin Lymphoma diagnosed 04/2019   History of radiation therapy 09/08/19- 09/22/19   Right neck/ chest 11 fractions of 2 Gy each to total 22 Gy.      Past Surgical History:  Procedure Laterality Date   IR BONE MARROW BIOPSY & ASPIRATION  06/21/2019   IR IMAGING GUIDED PORT INSERTION  06/21/2019   IR PATIENT EVAL TECH 0-60 MINS  01/15/2023   IR PATIENT EVAL TECH 0-60 MINS  02/05/2023   IR REMOVAL TUN ACCESS W/ PORT W/O FL MOD SED  01/07/2023   MASS BIOPSY Right 05/11/2019   Procedure: EXCISIONAL BIOPSY OF RIGHT CERVICAL LYMPH NODE;  Surgeon: Carlie Clark, MD;  Location: [facility] OR;  Service: ENT;  Laterality: Right;   TONSILLECTOMY      Family History  Problem Relation Age of Onset   Hypertension Mother    Diabetes Mother     Social History   Tobacco Use   Smoking status: Some Days    Types: Cigarettes   Smokeless tobacco: Never   Tobacco comments:    Per pt: 2-3 cigars daily (12/21/20)  Vaping Use   Vaping status: Never Used  Substance Use Topics   Alcohol use: Not Currently   Drug use: Never    ROS   Objective:   Vitals: BP (!) 144/86 (BP Location: Right Arm)   Pulse (!) 103   Temp 98.2 F (36.8 C) (Oral)   Resp 16   LMP 04/07/2024 (Approximate)   SpO2 98%   Physical Exam Constitutional:      General: She is not in acute distress.    Appearance: Normal appearance. She is well-developed. She is not ill-appearing, toxic-appearing or diaphoretic.  HENT:      Head: Normocephalic and atraumatic.     Right Ear: External ear normal.     Left Ear: External ear normal.     Nose: Nose normal.     Mouth/Throat:     Mouth: Mucous membranes are moist.  Eyes:     General: No scleral icterus.       Right eye: No discharge.        Left eye: No discharge.     Extraocular Movements: Extraocular movements intact.  Cardiovascular:     Rate and Rhythm: Normal rate and regular rhythm.     Heart sounds: Normal heart sounds. No murmur heard.    No friction rub. No gallop.  Pulmonary:     Effort: Pulmonary effort is normal. No  respiratory distress.     Breath sounds: No stridor. No wheezing, rhonchi or rales.  Chest:     Chest wall: No tenderness.  Skin:    General: Skin is warm and dry.  Neurological:     General: No focal deficit present.     Mental Status: She is alert and oriented to person, place, and time.  Psychiatric:        Mood and Affect: Mood normal.        Behavior: Behavior normal.    DG Chest 2 View Result Date: 05/08/2024 EXAM: 2 VIEW(S) XRAY OF THE CHEST 05/08/2024 10:56:32 AM COMPARISON: 08/04/2022 CLINICAL HISTORY: Chest congestion. FINDINGS: LUNGS AND PLEURA: No focal pulmonary opacity. No pulmonary edema. No pleural effusion. No pneumothorax. HEART AND MEDIASTINUM: No acute abnormality of the cardiac and mediastinal silhouettes. BONES AND SOFT TISSUES: No acute osseous abnormality. IMPRESSION: 1. No acute process. Electronically signed by: Waddell Calk MD 05/08/2024 11:31 AM EDT RP Workstation: [id number]    Assessment and Plan :   PDMP not reviewed this encounter.  1. Chest congestion   2. Atypical chest pain   3. Mild persistent asthma with (acute) exacerbation    Considered pulmonary embolism but at this stage, patient would like to finish out her prednisone .  Will not switch to Medrol  Dosepak.  Patient also consider IM steroid but ultimately declined.  Continue supportive care.  Maintain strict ER precautions.   Christopher Savannah, PA-C 05/08/24 1213

## 2024-05-08 NOTE — ED Triage Notes (Signed)
 Pt states chest congestion and pain to her left lung.  Seen here yesterday for the same.  States the symptoms are worse today.

## 2024-05-14 ENCOUNTER — Other Ambulatory Visit: Payer: Self-pay

## 2024-05-14 ENCOUNTER — Emergency Department (HOSPITAL_COMMUNITY)

## 2024-05-14 ENCOUNTER — Emergency Department (HOSPITAL_COMMUNITY)
Admission: EM | Admit: 2024-05-14 | Discharge: 2024-05-14 | Disposition: A | Discharge status: Home / Self Care | Attending: Emergency Medicine | Admitting: Emergency Medicine

## 2024-05-14 DIAGNOSIS — Z7982 Long term (current) use of aspirin: Secondary | ICD-10-CM | POA: Insufficient documentation

## 2024-05-14 DIAGNOSIS — R059 Cough, unspecified: Secondary | ICD-10-CM | POA: Insufficient documentation

## 2024-05-14 DIAGNOSIS — Z720 Tobacco use: Secondary | ICD-10-CM | POA: Insufficient documentation

## 2024-05-14 DIAGNOSIS — R079 Chest pain, unspecified: Secondary | ICD-10-CM | POA: Diagnosis not present

## 2024-05-14 DIAGNOSIS — J40 Bronchitis, not specified as acute or chronic: Secondary | ICD-10-CM

## 2024-05-14 LAB — BASIC METABOLIC PANEL WITH GFR
Anion gap: 14 (ref 5–15)
BUN: 17 mg/dL (ref 6–20)
CO2: 23 mmol/L (ref 22–32)
Calcium: 9.2 mg/dL (ref 8.9–10.3)
Chloride: 103 mmol/L (ref 98–111)
Creatinine, Ser: 0.85 mg/dL (ref 0.44–1.00)
GFR, Estimated: >60 mL/min (ref >60)
Glucose, Bld: 100 mg/dL — ABNORMAL HIGH (ref 70–99)
Potassium: 3.8 mmol/L (ref 3.5–5.1)
Sodium: 140 mmol/L (ref 135–145)

## 2024-05-14 LAB — RESP PANEL BY RT-PCR (RSV, FLU A&B, COVID)  RVPGX2
Influenza A by PCR: NEGATIVE
Influenza B by PCR: NEGATIVE
Resp Syncytial Virus by PCR: NEGATIVE
SARS Coronavirus 2 by RT PCR: NEGATIVE

## 2024-05-14 LAB — CBC
HCT: 35.7 % — ABNORMAL LOW (ref 36.0–46.0)
Hemoglobin: 11.3 g/dL — ABNORMAL LOW (ref 12.0–15.0)
MCH: 26.7 pg (ref 26.0–34.0)
MCHC: 31.7 g/dL (ref 30.0–36.0)
MCV: 84.4 fL (ref 80.0–100.0)
Platelets: 362 K/uL (ref 150–400)
RBC: 4.23 MIL/uL (ref 3.87–5.11)
RDW: 14.4 % (ref 11.5–15.5)
WBC: 7.7 K/uL (ref 4.0–10.5)
nRBC: 0 % (ref 0.0–0.2)

## 2024-05-14 LAB — TROPONIN T, HIGH SENSITIVITY: Troponin T High Sensitivity: <15 ng/L (ref 0–19)

## 2024-05-14 LAB — D-DIMER, QUANTITATIVE: D-Dimer, Quant: <0.27 ug{FEU}/mL (ref 0.00–0.50)

## 2024-05-14 LAB — HCG, QUANTITATIVE, PREGNANCY: hCG, Beta Chain, Quant, S: <1 m[IU]/mL (ref <5)

## 2024-05-14 MED ORDER — ALBUTEROL SULFATE (2.5 MG/3ML) 0.083% IN NEBU
5.0000 mg | INHALATION_SOLUTION | Freq: Once | RESPIRATORY_TRACT | Status: AC
  Administered 2024-05-14: 5 mg via RESPIRATORY_TRACT
  Filled 2024-05-14: qty 6

## 2024-05-14 MED ORDER — PREDNISONE 20 MG PO TABS
60.0000 mg | ORAL_TABLET | Freq: Once | ORAL | Status: AC
  Administered 2024-05-14: 60 mg via ORAL
  Filled 2024-05-14: qty 3

## 2024-05-14 MED ORDER — IPRATROPIUM BROMIDE 0.02 % IN SOLN
0.5000 mg | Freq: Once | RESPIRATORY_TRACT | Status: AC
  Administered 2024-05-14: 0.5 mg via RESPIRATORY_TRACT
  Filled 2024-05-14: qty 2.5

## 2024-05-14 MED ORDER — PREDNISONE 50 MG PO TABS: ORAL_TABLET | ORAL | 0 refills | Status: AC

## 2024-05-14 NOTE — ED Provider Notes (Signed)
 [place] EMERGENCY DEPARTMENT AT [facility] Provider Note   CSN: [id number] Arrival date & time: 05/14/24  1236     Patient presents with: Chest Pain   Faith Pope is a 33 y.o. female.   33 year old female presents with acute onset of left-sided chest discomfort after coughing.  States that she recent had a URI last week.  While today at work she was walking around and started coughing again.  Became short of breath and lightheaded.  Had some sharp left-sided chest pain.  She also noticed some wheezing as well today.  Denies any recent fever or chills.  No recent leg pain or swelling.  No prior history of PE.  No coronary history.  Symptoms better when she use her albuterol  inhaler.  Uses tobacco daily but denies any formal diagnosis of COPD       Prior to Admission medications   Medication Sig Start Date End Date Taking? Authorizing Provider  acetaminophen  (TYLENOL ) 650 MG CR tablet Take 650 mg by mouth every 8 (eight) hours as needed for pain.    [provider]  albuterol  (PROVENTIL ) (2.5 MG/3ML) 0.083% nebulizer solution Take 3 mLs (2.5 mg total) by nebulization every 4 (four) hours as needed for wheezing or shortness of breath. 05/07/24 05/07/25  Christopher Savannah, PA-C  albuterol  (VENTOLIN  HFA) 108 (90 Base) MCG/ACT inhaler Inhale 1-2 puffs into the lungs every 6 (six) hours as needed for wheezing or shortness of breath. 05/07/24   Christopher Savannah, PA-C  aspirin 500 MG buffered tablet Take 500 mg by mouth every 6 (six) hours as needed for pain.    [provider]  aspirin-acetaminophen -caffeine (EXCEDRIN MIGRAINE) 250-250-65 MG tablet Take 2 tablets by mouth every 6 (six) hours as needed for headache. Patient not taking: Reported on 09/23/2023    [provider]  benzonatate  (TESSALON ) 100 MG capsule Take 1 capsule (100 mg total) by mouth every 8 (eight) hours. Patient not taking: Reported on 09/24/2022 12/28/21   Harris, Abigail, PA-C   clotrimazole -betamethasone  (LOTRISONE ) cream Apply topically 2 times daily. Patient not taking: Reported on 09/24/2022 10/09/21   Sikora, Rebecca, DPM  doxycycline  (VIBRAMYCIN ) 100 MG capsule Take 1 capsule (100 mg total) by mouth 2 (two) times daily. Patient not taking: Reported on 09/24/2022 12/28/21   Harris, Abigail, PA-C  methylPREDNISolone  (MEDROL  DOSEPAK) 4 MG TBPK tablet Use as directed Patient not taking: Reported on 09/24/2022 12/28/21   Harris, Abigail, PA-C  methylPREDNISolone  (MEDROL ) 4 MG TBPK tablet Take as directed on package Patient not taking: Reported on 09/23/2023 08/06/23     nicotine  (NICODERM CQ  - DOSED IN MG/24 HOURS) 14 mg/24hr patch Place 1 patch (14 mg total) onto the skin daily. Apply 21 mg patch daily for 6 weeks, then appy the 14mg  patch daily for 2 weeks, then apply the 7 mg patch daily for 2 weeks. Patient not taking: Reported on 09/23/2023 09/24/22   Wyatt Leeroy HERO, PA-C  nicotine  (NICODERM CQ  - DOSED IN MG/24 HOURS) 21 mg/24hr patch Place 1 patch (21 mg total) onto the skin daily. Apply the 21 mg patch daily for 6 weeks, then appy the 14mg  patch daily for 2 weeks, then apply the 7 mg patch daily for 2 weeks. Patient not taking: Reported on 09/23/2023 09/24/22   Wyatt Leeroy HERO, PA-C  nicotine  (NICODERM CQ  - DOSED IN MG/24 HR) 7 mg/24hr patch Place 1 patch (7 mg total) onto the skin daily. **Apply 21 mg patch daily for 6 weeks, then appy the 14mg   patch daily for 2 weeks, then apply the 7 mg patch daily for 2 weeks.** Patient not taking: Reported on 09/23/2023 09/24/22   Wyatt Leeroy HERO, PA-C  predniSONE  (DELTASONE ) 20 MG tablet Take 2 tablets daily with breakfast. 05/07/24   Christopher Savannah, PA-C  promethazine -dextromethorphan (PROMETHAZINE -DM) 6.25-15 MG/5ML syrup Take 5 mLs by mouth 3 (three) times daily as needed for cough. 05/07/24   Christopher Savannah, PA-C  sulfamethoxazole -trimethoprim  (BACTRIM  DS) 800-160 MG tablet Take 1 tablet by mouth 2 (two) times daily for 10 days Patient not taking:  Reported on 09/24/2022 04/21/22     omeprazole  (PRILOSEC) 40 MG capsule Take 1 capsule (40 mg total) by mouth daily. Patient not taking: Reported on 08/30/2019 07/28/19 07/13/20  Tanner, Van E., PA-C  prochlorperazine  (COMPAZINE ) 10 MG tablet Take 1 tablet (10 mg total) by mouth every 6 (six) hours as needed for nausea or vomiting. Patient not taking: Reported on 08/30/2019 06/23/19 07/13/20  Heilingoetter, Cassandra L, PA-C    Allergies: Shellfish allergy, Amoxicillin, and Penicillins    Review of Systems  All other systems reviewed and are negative.   Updated Vital Signs BP (!) 141/106 (BP Location: Left Arm)   Pulse (!) 115   Temp 98.3 F (36.8 C) (Oral)   Resp 18   Ht 1.575 m (5' 2)   Wt 86.2 kg   LMP 04/07/2024 (Approximate)   SpO2 98%   BMI 34.75 kg/m   Physical Exam Vitals and nursing note reviewed.  Constitutional:      General: She is not in acute distress.    Appearance: Normal appearance. She is well-developed. She is not toxic-appearing.  HENT:     Head: Normocephalic and atraumatic.  Eyes:     General: Lids are normal.     Conjunctiva/sclera: Conjunctivae normal.     Pupils: Pupils are equal, round, and reactive to light.  Neck:     Thyroid : No thyroid  mass.     Trachea: No tracheal deviation.  Cardiovascular:     Rate and Rhythm: Normal rate and regular rhythm.     Heart sounds: Normal heart sounds. No murmur heard.    No gallop.  Pulmonary:     Effort: Pulmonary effort is normal. No respiratory distress.     Breath sounds: No stridor. Examination of the left-upper field reveals decreased breath sounds. Decreased breath sounds present. No wheezing, rhonchi or rales.  Abdominal:     General: There is no distension.     Palpations: Abdomen is soft.     Tenderness: There is no abdominal tenderness. There is no rebound.  Musculoskeletal:        General: No tenderness. Normal range of motion.     Cervical back: Normal range of motion and neck supple.  Skin:     General: Skin is warm and dry.     Findings: No abrasion or rash.  Neurological:     Mental Status: She is alert and oriented to person, place, and time. Mental status is at baseline.     GCS: GCS eye subscore is 4. GCS verbal subscore is 5. GCS motor subscore is 6.     Cranial Nerves: No cranial nerve deficit.     Sensory: No sensory deficit.     Motor: Motor function is intact.  Psychiatric:        Attention and Perception: Attention normal.        Speech: Speech normal.        Behavior: Behavior normal.     (  all labs ordered are listed, but only abnormal results are displayed) Labs Reviewed  RESP PANEL BY RT-PCR (RSV, FLU A&B, COVID)  [id number]  BASIC METABOLIC PANEL WITH GFR  CBC  D-DIMER, QUANTITATIVE  HCG, QUANTITATIVE, PREGNANCY  TROPONIN T, HIGH SENSITIVITY    EKG: EKG Interpretation Date/Time:  Friday May 14 2024 12:56:32 EDT Ventricular Rate:  107 PR Interval:  135 QRS Duration:  74 QT Interval:  347 QTC Calculation: 463 R Axis:   24  Text Interpretation: Sinus tachycardia LVH by voltage No significant change since last tracing Confirmed by Dasie Faden ([id number]) on 05/14/2024 1:39:43 PM  Radiology: No results found.   Procedures   Medications Ordered in the ED - No data to display                                  Medical Decision Making Amount and/or Complexity of Data Reviewed Labs: ordered. Radiology: ordered.  Risk Prescription drug management.  COVID and flu test negative. Patient is EKG shows sinus tachycardia.  Patient's chest x-ray showed no acute findings.  Patient given albuterol  does feel better.  Low suspicion for PE as D-dimer was negative.  Patient did have a troponin which showed was negative.  Low suspicion for ACS.  Patient's heart score is less than 3.  Suspect that patient likely is having some bronchitis.  Will place patient on prednisone  she has an inhaler and will discharge     Final diagnoses:  None    ED Discharge  Orders     None          Dasie Faden, MD 05/14/24 1459

## 2024-05-14 NOTE — ED Triage Notes (Signed)
 Patient in today reporting chest congestion, cough since last week. Sweats, clammy, chest pain left side radiating to left arm.

## 2024-09-08 NOTE — Progress Notes (Incomplete)
 Pain issues, if any: *** Using a feeding tube?: *** Weight changes, if any: *** Swallowing issues, if any: *** Smoking or chewing tobacco? *** Using fluoride toothpaste daily? *** Last ENT visit was on: *** Other notable issues, if any: ***

## 2024-09-21 ENCOUNTER — Ambulatory Visit: Admitting: Radiation Oncology

## 2024-09-21 ENCOUNTER — Ambulatory Visit: Payer: Self-pay | Admitting: Radiology

## 2024-09-22 NOTE — Progress Notes (Incomplete)
 Pain issues, if any: *** Using a feeding tube?: *** Weight changes, if any: *** Swallowing issues, if any: *** Smoking or chewing tobacco? *** Using fluoride toothpaste daily? *** Last ENT visit was on: *** Other notable issues, if any: ***

## 2024-09-27 ENCOUNTER — Ambulatory Visit: Admitting: Radiation Oncology

## 2024-09-29 ENCOUNTER — Ambulatory Visit
Admission: RE | Admit: 2024-09-29 | Discharge: 2024-09-29 | Attending: Radiation Oncology | Admitting: Radiation Oncology

## 2024-09-29 ENCOUNTER — Other Ambulatory Visit: Payer: Self-pay

## 2024-09-29 ENCOUNTER — Ambulatory Visit
Admission: RE | Admit: 2024-09-29 | Discharge: 2024-09-29 | Disposition: A | Source: Ambulatory Visit | Attending: Internal Medicine

## 2024-09-29 VITALS — BP 133/96 | HR 99 | Temp 98.0°F | Resp 16 | Wt 182.5 lb

## 2024-09-29 DIAGNOSIS — C8111 Nodular sclerosis classical Hodgkin lymphoma, lymph nodes of head, face, and neck: Secondary | ICD-10-CM

## 2024-09-29 DIAGNOSIS — Z1329 Encounter for screening for other suspected endocrine disorder: Secondary | ICD-10-CM

## 2024-09-29 LAB — TSH: TSH: 2.53 u[IU]/mL (ref 0.350–4.500)

## 2024-09-29 NOTE — Progress Notes (Signed)
" ° °  Pain issues, if any: None Using a feeding tube?: None Weight changes, if any:  Wt Readings from Last 3 Encounters:  09/29/24 182 lb 8 oz (82.8 kg)  05/14/24 190 lb (86.2 kg)  02/12/24 200 lb (90.7 kg)   Swallowing issues, if any: None Smoking or chewing tobacco? Yes, Using fluoride toothpaste daily? None Last ENT visit was on: None Other notable issues, if any: None    BP (!) 133/96 (BP Location: Right Arm, Patient Position: Sitting)   Pulse 99   Temp 98 F (36.7 C) (Temporal)   Resp 16   Wt 182 lb 8 oz (82.8 kg)   SpO2 100%   BMI 33.38 kg/m   "

## 2024-09-29 NOTE — Progress Notes (Addendum)
 " Radiation Oncology         [phone] ________________________________  Name: Faith Pope MRN: [id number]  Date: 09/29/2024  DOB: 1990/11/06  Follow-Up Visit Note  Outpatient  CC:  Faith Pope  Diagnosis and Prior Radiotherapy:    ICD-10-CM   1. 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 Yes, she smokes 6 black cigars a day which are very high in nicotine  content Using fluoride toothpaste daily? None Last ENT visit was on: None but she continues to see medical oncology Other notable issues, if any: No new lumps or bumps in her body.  She has a wonderful baby girl who is 55 months old.  She enjoys being a parent.  She is here with her supportive partner.  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  She often smokes to deal with the stress.  She is open to seeing a therapist to help her with smoking cessation and stress management.  ALLERGIES:  is allergic to shellfish allergy, amoxicillin, and penicillins.  Meds: Current Outpatient Medications  Medication Sig Dispense Refill   acetaminophen  (TYLENOL ) 650 MG CR tablet Take 650 mg by mouth every 8 (eight) hours as needed for pain.     albuterol  (PROVENTIL ) (2.5 MG/3ML) 0.083% nebulizer solution Take 3 mLs (2.5 mg total) by nebulization every 4 (four) hours as needed for wheezing or shortness of  breath. 75 mL 0   albuterol  (VENTOLIN  HFA) 108 (90 Base) MCG/ACT inhaler Inhale 1-2 puffs into the lungs every 6 (six) hours as needed for wheezing or shortness of breath. 8 g 0   aspirin 500 MG buffered tablet Take 500 mg by mouth every 6 (six) hours as needed for pain.     aspirin-acetaminophen -caffeine (EXCEDRIN MIGRAINE) 250-250-65 MG tablet Take 2 tablets by mouth every 6 (six) hours as needed for headache. (Patient not taking: Reported on 09/23/2023)     benzonatate  (TESSALON ) 100 MG capsule Take 1 capsule (100 mg total) by mouth every 8 (eight) hours. (Patient not taking: Reported on 09/24/2022) 21 capsule 0   clotrimazole -betamethasone  (LOTRISONE ) cream Apply topically 2 times daily. (Patient not taking: Reported on 09/24/2022) 30 g 0   doxycycline  (VIBRAMYCIN ) 100 MG capsule Take 1 capsule (100 mg total) by mouth 2 (two) times daily. (Patient not taking: Reported on 09/24/2022) 14 capsule 0   methylPREDNISolone  (MEDROL  DOSEPAK) 4 MG TBPK tablet Use as directed (Patient not taking: Reported on 09/24/2022) 21 tablet 0   methylPREDNISolone  (MEDROL ) 4 MG TBPK tablet Take as directed on package (Patient not taking: Reported on 09/23/2023) 21 tablet 0   nicotine  (NICODERM CQ  - DOSED IN MG/24 HOURS) 14 mg/24hr patch Place 1 patch (14 mg total) onto the skin daily. Apply 21 mg patch daily for 6 weeks, then appy the 14mg  patch  daily for 2 weeks, then apply the 7 mg patch daily for 2 weeks. (Patient not taking: Reported on 09/23/2023) 14 patch 0   nicotine  (NICODERM CQ  - DOSED IN MG/24 HOURS) 21 mg/24hr patch Place 1 patch (21 mg total) onto the skin daily. Apply the 21 mg patch daily for 6 weeks, then appy the 14mg  patch daily for 2 weeks, then apply the 7 mg patch daily for 2 weeks. (Patient not taking: Reported on 09/23/2023) 14 patch 2   nicotine  (NICODERM CQ  - DOSED IN MG/24 HR) 7 mg/24hr patch Place 1 patch (7 mg total) onto the skin daily. **Apply 21 mg patch daily for 6 weeks, then appy the 14mg  patch  daily for 2 weeks, then apply the 7 mg patch daily for 2 weeks.** (Patient not taking: Reported on 09/23/2023) 14 patch 0   predniSONE  (DELTASONE ) 20 MG tablet Take 2 tablets daily with breakfast. 10 tablet 0   predniSONE  (DELTASONE ) 50 MG tablet One po every day x 5 5 tablet 0   promethazine -dextromethorphan (PROMETHAZINE -DM) 6.25-15 MG/5ML syrup Take 5 mLs by mouth 3 (three) times daily as needed for cough. 200 mL 0   sulfamethoxazole -trimethoprim  (BACTRIM  DS) 800-160 MG tablet Take 1 tablet by mouth 2 (two) times daily for 10 days (Patient not taking: Reported on 09/24/2022) 20 tablet 0   No current facility-administered medications for this encounter.    Physical Findings:  The patient is in no acute distress. Patient is alert and oriented.  weight is 182 lb 8 oz (82.8 kg). Her temporal temperature is 98 F (36.7 C). Her blood pressure is 133/96 (abnormal) and her pulse is 99. Her respiration is 16 and oxygen saturation is 100%. Faith Pope    NECK: No palpable adenopathy in the cervical or supraclavicular neck HEENT: Mouth and upper throat are clear Heart: Regular in rate and rhythm Chest: clear to auscultation bilaterally Abdomen is soft nontender nondistended Extremities without any edema Lymphatics: No palpable masses in the axillary regions bilaterally or in the neck Skin: Satisfactory healing of skin in the radiation treatment fields MSK: Normal strength and muscle tone throughout.  Breasts: No palpable adenopathy in the axillary regions bilaterally.  No palpable breast masses  Lab Findings: Lab Results  Component Value Date   WBC 7.7 05/14/2024   HGB 11.3 (L) 05/14/2024   HCT 35.7 (L) 05/14/2024   MCV 84.4 05/14/2024   PLT 362 05/14/2024    Radiographic Findings: I personally reviewed the radiological findings.    Impression/Plan: No evidence of disease on physical exam.  She has been followed with posttreatment CT imaging in medical oncology.    TSH within normal limits  today Lab Results  Component Value Date   TSH 2.530 09/29/2024   TOBACCO CESSATION COUNSELING: I asked the patient today about tobacco use. The patient uses tobacco.  I advised the patient to quit. Services were offered by me today including outpatient counseling and pharmacotherapy. I assessed for the willingness to attempt to quit and provided encouragement and demonstrated willingness to make referrals and/or prescriptions to help the patient attempt to quit. The patient has follow-up with the oncologic team to touch base on their tobacco use and /or cessation efforts.  Over 3 minutes were spent on this issue.  She is not yet ready to quit as she depends on smoking to help deal with stress.  I will make a referral to social work for counseling to help her deal with her stress and more healthy ways.  She is  very open to seeing a therapist in the community.  She did report that she has a lot of pain before menstruation.  She has not seen a gynecologist in years.  She is going to self refer to a gynecologist based on recommendations from friends and family.  I told her this is a good idea.  I have spoken with Elouise Husband of survivorship.  He is going to have someone from the survivorship team reach out to our patient for scheduling long-term survivorship appointments and appropriate screening studies given her history of low-dose radiation to the neck and chest as a young adult.  I will see her back on an as-needed basis.  She will benefit from continued annual TSH lab work as well in survivorship.  I will see her back as needed.  I wished her the very best.  She knows to call if she has any questions in the future.  On date of service, in total, I spent 45 minutes on this encounter; she was seen in person  -----------------------------------  Lauraine Golden, MD   "

## 2024-09-30 ENCOUNTER — Telehealth: Payer: Self-pay | Admitting: Radiation Oncology

## 2024-09-30 ENCOUNTER — Encounter: Payer: Self-pay | Admitting: *Deleted

## 2024-09-30 NOTE — Telephone Encounter (Signed)
 2/5 Forward outgoing referral to Devere BRAVO., Social Worker - Med[facility]

## 2024-09-30 NOTE — Progress Notes (Signed)
 Called pt to introduce Survivorship and to let her know that appt will be scheduled in April for her. Pt verbalized understanding.

## 2024-10-07 ENCOUNTER — Inpatient Hospital Stay: Admitting: Licensed Clinical Social Worker

## 2024-12-06 ENCOUNTER — Other Ambulatory Visit

## 2024-12-13 ENCOUNTER — Encounter: Admitting: Physician Assistant
